# Patient Record
Sex: Female | Born: 1995 | Race: White | Hispanic: No | Marital: Single | Smoking: Heavy tobacco smoker
Health system: Southern US, Community
[De-identification: ages and names within clinical notes are randomized; demographics above are authoritative.]

## PROBLEM LIST (undated history)

## (undated) ENCOUNTER — Inpatient Hospital Stay (HOSPITAL_COMMUNITY): Payer: Self-pay

## (undated) DIAGNOSIS — Z6841 Body Mass Index (BMI) 40.0 and over, adult: Secondary | ICD-10-CM

## (undated) DIAGNOSIS — D219 Benign neoplasm of connective and other soft tissue, unspecified: Secondary | ICD-10-CM

## (undated) DIAGNOSIS — E119 Type 2 diabetes mellitus without complications: Secondary | ICD-10-CM

## (undated) DIAGNOSIS — F329 Major depressive disorder, single episode, unspecified: Secondary | ICD-10-CM

## (undated) DIAGNOSIS — O24419 Gestational diabetes mellitus in pregnancy, unspecified control: Secondary | ICD-10-CM

## (undated) DIAGNOSIS — E282 Polycystic ovarian syndrome: Secondary | ICD-10-CM

## (undated) DIAGNOSIS — N809 Endometriosis, unspecified: Secondary | ICD-10-CM

## (undated) DIAGNOSIS — F419 Anxiety disorder, unspecified: Secondary | ICD-10-CM

## (undated) DIAGNOSIS — F32A Depression, unspecified: Secondary | ICD-10-CM

## (undated) DIAGNOSIS — R079 Chest pain, unspecified: Secondary | ICD-10-CM

## (undated) DIAGNOSIS — K219 Gastro-esophageal reflux disease without esophagitis: Secondary | ICD-10-CM

## (undated) DIAGNOSIS — G8929 Other chronic pain: Secondary | ICD-10-CM

## (undated) DIAGNOSIS — R87629 Unspecified abnormal cytological findings in specimens from vagina: Secondary | ICD-10-CM

## (undated) HISTORY — DX: Benign neoplasm of connective and other soft tissue, unspecified: D21.9

## (undated) HISTORY — PX: WISDOM TOOTH EXTRACTION: SHX21

## (undated) HISTORY — DX: Polycystic ovarian syndrome: E28.2

## (undated) HISTORY — PX: ADENOIDECTOMY: SUR15

## (undated) HISTORY — DX: Unspecified abnormal cytological findings in specimens from vagina: R87.629

## (undated) HISTORY — PX: TONSILLECTOMY: SUR1361

## (undated) HISTORY — DX: Endometriosis, unspecified: N80.9

---
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
## 2015-01-08 ENCOUNTER — Encounter (HOSPITAL_COMMUNITY): Payer: Self-pay

## 2015-01-08 ENCOUNTER — Emergency Department (HOSPITAL_COMMUNITY)
Admission: EM | Admit: 2015-01-08 | Discharge: 2015-01-08 | Disposition: A | Discharge status: Home / Self Care | Payer: Medicaid Other | Attending: Emergency Medicine | Admitting: Emergency Medicine

## 2015-01-08 DIAGNOSIS — J019 Acute sinusitis, unspecified: Secondary | ICD-10-CM

## 2015-01-08 DIAGNOSIS — R0981 Nasal congestion: Secondary | ICD-10-CM | POA: Diagnosis present

## 2015-01-08 DIAGNOSIS — F909 Attention-deficit hyperactivity disorder, unspecified type: Secondary | ICD-10-CM | POA: Insufficient documentation

## 2015-01-08 DIAGNOSIS — F419 Anxiety disorder, unspecified: Secondary | ICD-10-CM | POA: Insufficient documentation

## 2015-01-08 DIAGNOSIS — J069 Acute upper respiratory infection, unspecified: Secondary | ICD-10-CM | POA: Insufficient documentation

## 2015-01-08 DIAGNOSIS — Z79899 Other long term (current) drug therapy: Secondary | ICD-10-CM | POA: Diagnosis not present

## 2015-01-08 DIAGNOSIS — F17210 Nicotine dependence, cigarettes, uncomplicated: Secondary | ICD-10-CM | POA: Diagnosis not present

## 2015-01-08 DIAGNOSIS — K219 Gastro-esophageal reflux disease without esophagitis: Secondary | ICD-10-CM | POA: Diagnosis not present

## 2015-01-08 MED ORDER — PSEUDOEPHEDRINE HCL 60 MG PO TABS: 60.0000 mg | ORAL_TABLET | Freq: Four times a day (QID) | ORAL | Status: DC | PRN

## 2015-01-08 MED ORDER — FLUTICASONE PROPIONATE 50 MCG/ACT NA SUSP: 2.0000 | Freq: Every day | NASAL | Status: DC

## 2015-01-08 NOTE — ED Provider Notes (Signed)
CSN: [id number]     Arrival date & time 01/08/15  1732 History   By signing my name below, I, Randa Evens, attest that this documentation has been prepared under the direction and in the presence of Delsa Grana, PA-C. Electronically Signed: Randa Evens, ED Scribe. 01/08/2015. 5:53 PM.    Chief Complaint  Patient presents with  . Nasal Congestion    The history is provided by the patient. No language interpreter was used.   HPI Comments: Beverly Torres is a 19 y.o. female who presents to the Emergency Department complaining of new congestion onset 1 day. Pt states that she has associated slight sore throat, ear pain and sinus pressure. She reports nausea this morning which she attributes to not taking her acid reflux medication. She states that leaning her head forward makes the congestion worse. Pt states she has tried Robitussin with temporary relief. Denies fever, drainage, post nasal drip, cough, chest tightness, SOB and HA.  Does report recent sick contact with a friend who was diagnosed with URI.   Past Medical History  Diagnosis Date  . Attention deficit disorder   . Anxiety   . Acid reflux   . Anxiety    Past Surgical History  Procedure Laterality Date  . Adenoidectomy    . Tonsillectomy     No family history on file. Social History  Substance Use Topics  . Smoking status: Current Every Day Smoker    Types: Cigarettes  . Smokeless tobacco: Never Used  . Alcohol Use: No   OB History    No data available      Review of Systems  Constitutional: Negative.  Negative for fever and chills.  HENT: Positive for congestion, ear pain, sinus pressure and sore throat. Negative for ear discharge, postnasal drip and rhinorrhea.   Respiratory: Negative for cough, choking, chest tightness, shortness of breath, wheezing and stridor.   Cardiovascular: Negative.   Gastrointestinal: Negative for nausea and vomiting.  Skin: Negative.  Negative for rash.  Neurological: Negative.   Negative for headaches.     Allergies  Review of patient's allergies indicates no known allergies.  Home Medications   Prior to Admission medications   Medication Sig Start Date End Date Taking? Authorizing Provider  busPIRone (BUSPAR) 15 MG tablet Take 30 mg by mouth 2 (two) times daily.    Historical Provider, MD  cetirizine (ZYRTEC) 10 MG tablet Take 10 mg by mouth daily.    Historical Provider, MD  cloNIDine (CATAPRES) 0.3 MG tablet Take 0.3 mg by mouth at bedtime.     Historical Provider, MD  diphenhydrAMINE (BENADRYL) 25 MG tablet Take 25 mg by mouth every 6 (six) hours as needed for itching.    Historical Provider, MD  HYDROcodone-acetaminophen (NORCO/VICODIN) 5-325 MG per tablet Take 1-2 tablets by mouth every 6 (six) hours as needed. 04/10/14   Noland Fordyce, PA-C  lisdexamfetamine (VYVANSE) 50 MG capsule Take 50 mg by mouth daily.     Historical Provider, MD  naproxen (NAPROSYN) 500 MG tablet Take 1 tablet (500 mg total) by mouth 2 (two) times daily with a meal. 09/28/14   Linton Flemings, MD  oxymetazoline (AFRIN) 0.05 % nasal spray Place 1 spray into both nostrils once. No more than 3 days in a row 09/28/14   Linton Flemings, MD  polyethylene glycol [facility] / GLYCOLAX) packet Take 17 g by mouth daily. 10/28/12   Marissa Sciacca, PA-C  pseudoephedrine (SUDAFED) 120 MG 12 hr tablet Take 1 tablet (120 mg total)  by mouth 2 (two) times daily. 09/28/14   Linton Flemings, MD  ranitidine (ZANTAC) 150 MG tablet Take 150 mg by mouth 2 (two) times daily.    Historical Provider, MD   BP 123/64 mmHg  Temp(Src) 98.2 F (36.8 C) (Oral)  Resp 19  SpO2 100%   Physical Exam  Constitutional: She is oriented to person, place, and time. She appears well-developed and well-nourished. No distress.  HENT:  Head: Normocephalic and atraumatic.  Right Ear: Hearing, tympanic membrane, external ear and ear canal normal.  Left Ear: Hearing, tympanic membrane, external ear and ear canal normal.  Nose: Mucosal edema and  sinus tenderness present. Right sinus exhibits maxillary sinus tenderness and frontal sinus tenderness. Left sinus exhibits maxillary sinus tenderness and frontal sinus tenderness.  Mouth/Throat: Uvula is midline, oropharynx is clear and moist and mucous membranes are normal. Mucous membranes are not pale, not dry and not cyanotic. No oropharyngeal exudate, posterior oropharyngeal edema or posterior oropharyngeal erythema.  Nasal mucosal erythematous with clear discharge.  Eyes: Conjunctivae and EOM are normal.  Neck: Neck supple. No tracheal deviation present.  Cardiovascular: Normal rate, regular rhythm and normal heart sounds.   Pulmonary/Chest: Effort normal and breath sounds normal. No respiratory distress. She has no wheezes. She has no rales.  Musculoskeletal: Normal range of motion.  Lymphadenopathy:    She has no cervical adenopathy.  Neurological: She is alert and oriented to person, place, and time.  Skin: Skin is warm and dry.  Psychiatric: She has a normal mood and affect. Her behavior is normal.  Nursing note and vitals reviewed.   ED Course  Procedures (including critical care time) DIAGNOSTIC STUDIES: Oxygen Saturation is 100% on RA, normal by my interpretation.    COORDINATION OF CARE: 6:14 PM-Discussed treatment plan with pt at bedside and pt agreed to plan.     Labs Review Labs Reviewed - No data to display  Imaging Review No results found.    EKG Interpretation None      MDM   Final diagnoses:  None   Pt with nasal congestion and facial pain since last night, + sick contacts with URI Pt denies fever, cough, SOB, HA.  She has tried her anxiety medicine, vistaril and robitussin with temporary relief.  Exam is consistent with acute rhinosinusitis, likely viral.   Pt D/C'd with rx for flonase and sudafed.  Given return precautions.  Will f/up with PCP as needed.  Filed Vitals:   01/08/15 1747  BP: 123/64  Temp: 98.2 F (36.8 C)  TempSrc: Oral   Resp: 19  SpO2: 100%     I personally performed the services described in this documentation, which was scribed in my presence. The recorded information has been reviewed and is accurate.       Delsa Grana, PA-C 01/08/15 1841  Lajean Saver, MD 01/08/15 2011

## 2015-01-08 NOTE — ED Notes (Signed)
Pt here for nasal congestion, headache, "it seems like seasonal stuff" . Denies cough or chest pain

## 2015-01-08 NOTE — Discharge Instructions (Signed)
Upper Respiratory Infection, Adult Most upper respiratory infections (URIs) are caused by a virus. A URI affects the nose, throat, and upper air passages. The most common type of URI is often called "the common cold." HOME CARE   Take medicines only as told by your doctor.  Gargle warm saltwater or take cough drops to comfort your throat as told by your doctor.  Use a warm mist humidifier or inhale steam from a shower to increase air moisture. This may make it easier to breathe.  Drink enough fluid to keep your pee (urine) clear or pale yellow.  Eat soups and other clear broths.  Have a healthy diet.  Rest as needed.  Go back to work when your fever is gone or your doctor says it is okay.  You may need to stay home longer to avoid giving your URI to others.  You can also wear a face mask and wash your hands often to prevent spread of the virus.  Use your inhaler more if you have asthma.  Do not use any tobacco products, including cigarettes, chewing tobacco, or electronic cigarettes. If you need help quitting, ask your doctor. GET HELP IF:  You are getting worse, not better.  Your symptoms are not helped by medicine.  You have chills.  You are getting more short of breath.  You have brown or red mucus.  You have yellow or brown discharge from your nose.  You have pain in your face, especially when you bend forward.  You have a fever.  You have puffy (swollen) neck glands.  You have pain while swallowing.  You have white areas in the back of your throat. GET HELP RIGHT AWAY IF:   You have very bad or constant:  Headache.  Ear pain.  Pain in your forehead, behind your eyes, and over your cheekbones (sinus pain).  Chest pain.  You have long-lasting (chronic) lung disease and any of the following:  Wheezing.  Long-lasting cough.  Coughing up blood.  A change in your usual mucus.  You have a stiff neck.  You have changes in  your:  Vision.  Hearing.  Thinking.  Mood. MAKE SURE YOU:   Understand these instructions.  Will watch your condition.  Will get help right away if you are not doing well or get worse.   This information is not intended to replace advice given to you by your health care provider. Make sure you discuss any questions you have with your health care provider.   Document Released: 07/14/2007 Document Revised: 06/11/2014 Document Reviewed: 05/02/2013 Elsevier Interactive Patient Education 2016 Else[employer]  Sinusitis, Adult Sinusitis is redness, soreness, and puffiness (inflammation) of the air pockets in the bones of your face (sinuses). The redness, soreness, and puffiness can cause air and mucus to get trapped in your sinuses. This can allow germs to grow and cause an infection.  HOME CARE   Drink enough fluids to keep your pee (urine) clear or pale yellow.  Use a humidifier in your home.  Run a hot shower to create steam in the bathroom. Sit in the bathroom with the door closed. Breathe in the steam 3-4 times a day.  Put a warm, moist washcloth on your face 3-4 times a day, or as told by your doctor.  Use salt water sprays (saline sprays) to wet the thick fluid in your nose. This can help the sinuses drain.  Only take medicine as told by your doctor. GET HELP RIGHT AWAY IF:  Your pain gets worse.  You have very bad headaches.  You are sick to your stomach (nauseous).  You throw up (vomit).  You are very sleepy (drowsy) all the time.  Your face is puffy (swollen).  Your vision changes.  You have a stiff neck.  You have trouble breathing. MAKE SURE YOU:   Understand these instructions.  Will watch your condition.  Will get help right away if you are not doing well or get worse.   This information is not intended to replace advice given to you by your health care provider. Make sure you discuss any questions you have with your health care provider.    Document Released: 07/14/2007 Document Revised: 02/15/2014 Document Reviewed: 08/31/2011 Elsevier Interactive Patient Education [employer].

## 2015-01-08 NOTE — ED Notes (Signed)
See PAs notes for secondary assessment.  

## 2015-01-12 ENCOUNTER — Emergency Department (HOSPITAL_COMMUNITY)
Admission: EM | Admit: 2015-01-12 | Discharge: 2015-01-12 | Disposition: A | Discharge status: Home / Self Care | Payer: Medicaid Other | Attending: Emergency Medicine | Admitting: Emergency Medicine

## 2015-01-12 ENCOUNTER — Encounter (HOSPITAL_COMMUNITY): Payer: Self-pay | Admitting: *Deleted

## 2015-01-12 DIAGNOSIS — Z79899 Other long term (current) drug therapy: Secondary | ICD-10-CM | POA: Insufficient documentation

## 2015-01-12 DIAGNOSIS — F909 Attention-deficit hyperactivity disorder, unspecified type: Secondary | ICD-10-CM | POA: Insufficient documentation

## 2015-01-12 DIAGNOSIS — Z3202 Encounter for pregnancy test, result negative: Secondary | ICD-10-CM | POA: Insufficient documentation

## 2015-01-12 DIAGNOSIS — F17210 Nicotine dependence, cigarettes, uncomplicated: Secondary | ICD-10-CM | POA: Insufficient documentation

## 2015-01-12 DIAGNOSIS — Z7951 Long term (current) use of inhaled steroids: Secondary | ICD-10-CM | POA: Insufficient documentation

## 2015-01-12 DIAGNOSIS — F419 Anxiety disorder, unspecified: Secondary | ICD-10-CM | POA: Insufficient documentation

## 2015-01-12 DIAGNOSIS — K219 Gastro-esophageal reflux disease without esophagitis: Secondary | ICD-10-CM | POA: Insufficient documentation

## 2015-01-12 DIAGNOSIS — R1030 Lower abdominal pain, unspecified: Secondary | ICD-10-CM | POA: Insufficient documentation

## 2015-01-12 DIAGNOSIS — Z791 Long term (current) use of non-steroidal anti-inflammatories (NSAID): Secondary | ICD-10-CM | POA: Insufficient documentation

## 2015-01-12 DIAGNOSIS — E669 Obesity, unspecified: Secondary | ICD-10-CM | POA: Insufficient documentation

## 2015-01-12 LAB — URINALYSIS, ROUTINE W REFLEX MICROSCOPIC
BILIRUBIN URINE: NEGATIVE
Glucose, UA: NEGATIVE mg/dL
Hgb urine dipstick: NEGATIVE
Ketones, ur: NEGATIVE mg/dL
Leukocytes, UA: NEGATIVE
NITRITE: NEGATIVE
PH: 6.5 (ref 5.0–8.0)
Protein, ur: NEGATIVE mg/dL
SPECIFIC GRAVITY, URINE: 1.028 (ref 1.005–1.030)

## 2015-01-12 LAB — PREGNANCY, URINE: Preg Test, Ur: NEGATIVE
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
## 2015-01-12 NOTE — Discharge Instructions (Signed)

## 2015-01-12 NOTE — ED Notes (Signed)
Pt reports lower abd pain since yesterday. Denies vaginal or urinary symptoms.

## 2015-01-23 ENCOUNTER — Institutional Professional Consult (permissible substitution): Payer: Medicaid Other | Admitting: Family

## 2015-01-23 DIAGNOSIS — F411 Generalized anxiety disorder: Secondary | ICD-10-CM | POA: Diagnosis not present

## 2015-01-23 DIAGNOSIS — F902 Attention-deficit hyperactivity disorder, combined type: Secondary | ICD-10-CM | POA: Diagnosis not present

## 2015-02-04 ENCOUNTER — Emergency Department (HOSPITAL_COMMUNITY)
Admission: EM | Admit: 2015-02-04 | Discharge: 2015-02-04 | Disposition: A | Discharge status: Home / Self Care | Payer: Medicaid Other | Attending: Emergency Medicine | Admitting: Emergency Medicine

## 2015-02-04 ENCOUNTER — Encounter (HOSPITAL_COMMUNITY): Payer: Self-pay | Admitting: *Deleted

## 2015-02-04 DIAGNOSIS — J069 Acute upper respiratory infection, unspecified: Secondary | ICD-10-CM

## 2015-02-04 DIAGNOSIS — K5900 Constipation, unspecified: Secondary | ICD-10-CM | POA: Insufficient documentation

## 2015-02-04 DIAGNOSIS — K921 Melena: Secondary | ICD-10-CM | POA: Insufficient documentation

## 2015-02-04 DIAGNOSIS — F419 Anxiety disorder, unspecified: Secondary | ICD-10-CM | POA: Insufficient documentation

## 2015-02-04 DIAGNOSIS — Z79899 Other long term (current) drug therapy: Secondary | ICD-10-CM | POA: Insufficient documentation

## 2015-02-04 DIAGNOSIS — K5909 Other constipation: Secondary | ICD-10-CM

## 2015-02-04 DIAGNOSIS — K219 Gastro-esophageal reflux disease without esophagitis: Secondary | ICD-10-CM | POA: Insufficient documentation

## 2015-02-04 DIAGNOSIS — Z3202 Encounter for pregnancy test, result negative: Secondary | ICD-10-CM | POA: Insufficient documentation

## 2015-02-04 DIAGNOSIS — F17210 Nicotine dependence, cigarettes, uncomplicated: Secondary | ICD-10-CM | POA: Insufficient documentation

## 2015-02-04 LAB — POC OCCULT BLOOD, ED: Fecal Occult Bld: POSITIVE — AB

## 2015-02-04 LAB — URINALYSIS, ROUTINE W REFLEX MICROSCOPIC
Bilirubin Urine: NEGATIVE
Glucose, UA: NEGATIVE mg/dL
HGB URINE DIPSTICK: NEGATIVE
Ketones, ur: NEGATIVE mg/dL
LEUKOCYTES UA: NEGATIVE
NITRITE: NEGATIVE
PH: 5.5 (ref 5.0–8.0)
Protein, ur: NEGATIVE mg/dL
SPECIFIC GRAVITY, URINE: 1.021 (ref 1.005–1.030)

## 2015-02-04 LAB — COMPREHENSIVE METABOLIC PANEL
ALBUMIN: 4 g/dL (ref 3.5–5.0)
ALT: 35 U/L (ref 14–54)
ANION GAP: 9 (ref 5–15)
AST: 26 U/L (ref 15–41)
Alkaline Phosphatase: 63 U/L (ref 38–126)
BILIRUBIN TOTAL: 0.4 mg/dL (ref 0.3–1.2)
BUN: 9 mg/dL (ref 6–20)
CO2: 23 mmol/L (ref 22–32)
Calcium: 9.9 mg/dL (ref 8.9–10.3)
Chloride: 109 mmol/L (ref 101–111)
Creatinine, Ser: 0.85 mg/dL (ref 0.44–1.00)
GFR calc Af Amer: >60 mL/min (ref >60)
GLUCOSE: 113 mg/dL — AB (ref 65–99)
POTASSIUM: 3.9 mmol/L (ref 3.5–5.1)
Sodium: 141 mmol/L (ref 135–145)
TOTAL PROTEIN: 6.5 g/dL (ref 6.5–8.1)

## 2015-02-04 LAB — CBC
HEMATOCRIT: 40.9 % (ref 36.0–46.0)
HEMOGLOBIN: 13.7 g/dL (ref 12.0–15.0)
MCH: 30 pg (ref 26.0–34.0)
MCHC: 33.5 g/dL (ref 30.0–36.0)
MCV: 89.5 fL (ref 78.0–100.0)
Platelets: 307 10*3/uL (ref 150–400)
RBC: 4.57 MIL/uL (ref 3.87–5.11)
RDW: 12.2 % (ref 11.5–15.5)
WBC: 9.2 10*3/uL (ref 4.0–10.5)

## 2015-02-04 LAB — POC URINE PREG, ED: PREG TEST UR: NEGATIVE

## 2015-02-04 MED ORDER — DOCUSATE SODIUM 100 MG PO CAPS: 100.0000 mg | ORAL_CAPSULE | Freq: Two times a day (BID) | ORAL | Status: DC

## 2015-02-04 MED ORDER — DEXTROMETHORPHAN-GUAIFENESIN 20-200 MG/10ML PO SOLN: 10.0000 mL | Freq: Four times a day (QID) | ORAL | Status: DC | PRN

## 2015-02-04 NOTE — ED Notes (Signed)
The pt is c/o abd pain for 3 days and today she saw some blood in her stools she has chronic constipation  lmp dec 18th

## 2015-02-04 NOTE — Discharge Instructions (Signed)
Constipation, Adult Constipation is when a person:  Poops (has a bowel movement) less than 3 times a week.  Has a hard time pooping.  Has poop that is dry, hard, or bigger than normal. HOME CARE   Eat foods with a lot of fiber in them. This includes fruits, vegetables, beans, and whole grains such as brown rice.  Avoid fatty foods and foods with a lot of sugar. This includes french fries, hamburgers, cookies, candy, and soda.  If you are not getting enough fiber from food, take products with added fiber in them (supplements).  Drink enough fluid to keep your pee (urine) clear or pale yellow.  Exercise on a regular basis, or as told by your doctor.  Go to the restroom when you feel like you need to poop. Do not hold it.  Only take medicine as told by your doctor. Do not take medicines that help you poop (laxatives) without talking to your doctor first. GET HELP RIGHT AWAY IF:   You have bright red blood in your poop (stool).  Your constipation lasts more than 4 days or gets worse.  You have belly (abdominal) or butt (rectal) pain.  You have thin poop (as thin as a pencil).  You lose weight, and it cannot be explained. MAKE SURE YOU:   Understand these instructions.  Will watch your condition.  Will get help right away if you are not doing well or get worse.   This information is not intended to replace advice given to you by your health care provider. Make sure you discuss any questions you have with your health care provider.   Document Released: 07/14/2007 Document Revised: 02/15/2014 Document Reviewed: 11/06/2012 Elsevier Interactive Patient Education 2016 Elsevier [employer] Upper Respiratory Infection, Adult Most upper respiratory infections (URIs) are a viral infection of the air passages leading to the lungs. A URI affects the nose, throat, and upper air passages. The most common type of URI is nasopharyngitis and is typically referred to as "the common cold." URIs  run their course and usually go away on their own. Most of the time, a URI does not require medical attention, but sometimes a bacterial infection in the upper airways can follow a viral infection. This is called a secondary infection. Sinus and middle ear infections are common types of secondary upper respiratory infections. Bacterial pneumonia can also complicate a URI. A URI can worsen asthma and chronic obstructive pulmonary disease (COPD). Sometimes, these complications can require emergency medical care and may be life threatening.  CAUSES Almost all URIs are caused by viruses. A virus is a type of germ and can spread from one person to another.  RISKS FACTORS You may be at risk for a URI if:   You smoke.   You have chronic heart or lung disease.  You have a weakened defense (immune) system.   You are very young or very old.   You have nasal allergies or asthma.  You work in crowded or poorly ventilated areas.  You work in health care facilities or schools. SIGNS AND SYMPTOMS  Symptoms typically develop 2-3 days after you come in contact with a cold virus. Most viral URIs last 7-10 days. However, viral URIs from the influenza virus (flu virus) can last 14-18 days and are typically more severe. Symptoms may include:   Runny or stuffy (congested) nose.   Sneezing.   Cough.   Sore throat.   Headache.   Fatigue.   Fever.   Loss of appetite.  Pain in your forehead, behind your eyes, and over your cheekbones (sinus pain).  Muscle aches.  DIAGNOSIS  Your health care provider may diagnose a URI by:  Physical exam.  Tests to check that your symptoms are not due to another condition such as:  Strep throat.  Sinusitis.  Pneumonia.  Asthma. TREATMENT  A URI goes away on its own with time. It cannot be cured with medicines, but medicines may be prescribed or recommended to relieve symptoms. Medicines may help:  Reduce your fever.  Reduce your  cough.  Relieve nasal congestion. HOME CARE INSTRUCTIONS   Take medicines only as directed by your health care provider.   Gargle warm saltwater or take cough drops to comfort your throat as directed by your health care provider.  Use a warm mist humidifier or inhale steam from a shower to increase air moisture. This may make it easier to breathe.  Drink enough fluid to keep your urine clear or pale yellow.   Eat soups and other clear broths and maintain good nutrition.   Rest as needed.   Return to work when your temperature has returned to normal or as your health care provider advises. You may need to stay home longer to avoid infecting others. You can also use a face mask and careful hand washing to prevent spread of the virus.  Increase the usage of your inhaler if you have asthma.   Do not use any tobacco products, including cigarettes, chewing tobacco, or electronic cigarettes. If you need help quitting, ask your health care provider. PREVENTION  The best way to protect yourself from getting a cold is to practice good hygiene.   Avoid oral or hand contact with people with cold symptoms.   Wash your hands often if contact occurs.  There is no clear evidence that vitamin C, vitamin E, echinacea, or exercise reduces the chance of developing a cold. However, it is always recommended to get plenty of rest, exercise, and practice good nutrition.  SEEK MEDICAL CARE IF:   You are getting worse rather than better.   Your symptoms are not controlled by medicine.   You have chills.  You have worsening shortness of breath.  You have brown or red mucus.  You have yellow or brown nasal discharge.  You have pain in your face, especially when you bend forward.  You have a fever.  You have swollen neck glands.  You have pain while swallowing.  You have white areas in the back of your throat. SEEK IMMEDIATE MEDICAL CARE IF:   You have severe or  persistent:  Headache.  Ear pain.  Sinus pain.  Chest pain.  You have chronic lung disease and any of the following:  Wheezing.  Prolonged cough.  Coughing up blood.  A change in your usual mucus.  You have a stiff neck.  You have changes in your:  Vision.  Hearing.  Thinking.  Mood. MAKE SURE YOU:   Understand these instructions.  Will watch your condition.  Will get help right away if you are not doing well or get worse.   This information is not intended to replace advice given to you by your health care provider. Make sure you discuss any questions you have with your health care provider.   Document Released: 07/21/2000 Document Revised: 06/11/2014 Document Reviewed: 05/02/2013 Elsevier Interactive Patient Education [employer].

## 2015-02-04 NOTE — ED Provider Notes (Signed)
CSN: [id number]     Arrival date & time 02/04/15  1727 History   First MD Initiated Contact with Patient 02/04/15 2112     Chief Complaint  Patient presents with  . Abdominal Pain     (Consider location/radiation/quality/duration/timing/severity/associated sxs/prior Treatment) HPI Comments: Patient with history of constipation dominant irritable bowel reports noticing small amount of blood when she wiped after attempting defecation this afternoon. She endorses frequent constipation with hard stools. Last normal BM about three days ago.  Patient is a 19 y.o. female presenting with hematochezia and URI.  Rectal Bleeding Quality:  Bright red Amount:  Scant Duration:  1 day Timing:  Sporadic Progression:  Unable to specify Chronicity:  Recurrent Context: constipation   Similar prior episodes: no   Ineffective treatments:  None tried Associated symptoms: abdominal pain   Associated symptoms: no dizziness, no fever, no hematemesis and no vomiting   URI Presenting symptoms: congestion, cough and rhinorrhea   Presenting symptoms: no fever   Severity:  Mild Onset quality:  Gradual Duration:  3 days Timing:  Intermittent Progression:  Waxing and waning Chronicity:  New Associated symptoms: no myalgias, no swollen glands and no wheezing     Past Medical History  Diagnosis Date  . Attention deficit disorder   . Anxiety   . Acid reflux   . Anxiety    Past Surgical History  Procedure Laterality Date  . Adenoidectomy    . Tonsillectomy    . Wisdom tooth extraction     No family history on file. Social History  Substance Use Topics  . Smoking status: Current Every Day Smoker    Types: Cigarettes  . Smokeless tobacco: Never Used  . Alcohol Use: No   OB History    No data available     Review of Systems  Constitutional: Negative for fever.  HENT: Positive for congestion and rhinorrhea.   Respiratory: Positive for cough. Negative for wheezing.   Gastrointestinal:  Positive for abdominal pain and hematochezia. Negative for vomiting and hematemesis.  Musculoskeletal: Negative for myalgias.  Neurological: Negative for dizziness.  All other systems reviewed and are negative.     Allergies  Review of patient's allergies indicates no known allergies.  Home Medications   Prior to Admission medications   Medication Sig Start Date End Date Taking? Authorizing Provider  HYDROcodone-acetaminophen (NORCO/VICODIN) 5-325 MG per tablet Take 1-2 tablets by mouth every 6 (six) hours as needed. Patient not taking: Reported on 01/12/2015 04/10/14   Noland Fordyce, PA-C  hydrOXYzine (ATARAX/VISTARIL) 50 MG tablet Take 50 mg by mouth 3 (three) times daily as needed for anxiety.    Historical Provider, MD  naproxen (NAPROSYN) 500 MG tablet Take 1 tablet (500 mg total) by mouth 2 (two) times daily with a meal. Patient not taking: Reported on 01/12/2015 09/28/14   Linton Flemings, MD  oxymetazoline (AFRIN) 0.05 % nasal spray Place 1 spray into both nostrils once. No more than 3 days in a row Patient not taking: Reported on 01/12/2015 09/28/14   Linton Flemings, MD  polyethylene glycol [facility] Floria [facility]aveling) packet Take 17 g by mouth daily. Patient not taking: Reported on 01/12/2015 10/28/12   Marissa Sciacca, PA-C  pseudoephedrine (SUDAFED) 60 MG tablet Take 1 tablet (60 mg total) by mouth every 6 (six) hours as needed for congestion. Patient not taking: Reported on 01/12/2015 01/08/15   Delsa Grana, PA-C  ranitidine (ZANTAC) 150 MG tablet Take 150 mg by mouth 2 (two) times daily.    Historical Provider,  MD   BP 116/75 mmHg  Pulse 109  Temp(Src) 98.6 F (37 C) (Oral)  Resp 18  Ht 5\' 6"  (1.676 m)  Wt 89.359 kg  BMI 31.81 kg/m2  SpO2 100%  LMP 01/26/2015 Physical Exam  Constitutional: She is oriented to person, place, and time. She appears well-developed and well-nourished.  HENT:  Head: Normocephalic and atraumatic.  Eyes: Conjunctivae are normal. Pupils are equal, round, and  reactive to light.  Neck: Neck supple.  Cardiovascular: Normal rate and regular rhythm.   Pulmonary/Chest: Effort normal and breath sounds normal.  Abdominal: Soft. Bowel sounds are normal. She exhibits no distension. There is no tenderness.  Genitourinary: Rectum normal. Rectal exam shows no external hemorrhoid, no internal hemorrhoid and no mass.  Musculoskeletal: She exhibits no edema or tenderness.  Lymphadenopathy:    She has no cervical adenopathy.  Neurological: She is alert and oriented to person, place, and time.  Skin: Skin is warm and dry.  Psychiatric: She has a normal mood and affect.  Nursing note and vitals reviewed.   ED Course  Procedures (including critical care time) Labs Review Labs Reviewed  COMPREHENSIVE METABOLIC PANEL - Abnormal; Notable for the following:    Glucose, Bld 113 (*)    All other components within normal limits  POC OCCULT BLOOD, ED - Abnormal; Notable for the following:    Fecal Occult Bld POSITIVE (*)    All other components within normal limits  CBC  URINALYSIS, ROUTINE W REFLEX MICROSCOPIC (NOT AT [facility])  POC URINE PREG, ED    Imaging Review No results found. I have personally reviewed and evaluated these images and lab results as part of my medical decision-making.   EKG Interpretation None     Lab results reviewed and shared with patient. Patient is not anemic. No gross blood or hemorrhoids on rectal exam. Benign abdominal exam. No hemodynamic instability MDM   Final diagnoses:  None    URI. Rectal bleeding. Care instructions provided. Return precautions discussed. Follow-up with PCP/Gastroenterology.    Etta Quill, NP 02/05/15 0045  Noemi Chapel, MD 02/08/15 [phone]

## 2015-02-13 ENCOUNTER — Institutional Professional Consult (permissible substitution): Payer: Self-pay | Admitting: Family

## 2015-05-14 ENCOUNTER — Telehealth: Payer: Self-pay | Admitting: Family

## 2015-05-14 NOTE — Telephone Encounter (Signed)
Received fax requesting refill for Clonidine HCL 0.3 mg.  Patient last seen 02/13/15.  Left message for patient to call and schedule a return appointment. Left second message 05/14/15 2:20pm.

## 2015-05-14 NOTE — Telephone Encounter (Signed)
Patient is no longer taking Clonidine. [facility] informed by fax.

## 2015-06-11 ENCOUNTER — Telehealth: Payer: Self-pay | Admitting: Family

## 2015-06-11 NOTE — Telephone Encounter (Signed)
Patient called for refill for Hydroxyzine.  Patient last seen 01/23/15.  Left message for patient to call and schedule follow-up as soon as possible.

## 2015-06-12 ENCOUNTER — Ambulatory Visit (INDEPENDENT_AMBULATORY_CARE_PROVIDER_SITE_OTHER): Payer: Medicaid Other | Admitting: Family

## 2015-06-12 ENCOUNTER — Encounter: Payer: Self-pay | Admitting: Family

## 2015-06-12 VITALS — BP 112/64 | HR 84 | Resp 16 | Ht 63.25 in | Wt 203.5 lb

## 2015-06-12 DIAGNOSIS — F902 Attention-deficit hyperactivity disorder, combined type: Secondary | ICD-10-CM

## 2015-06-12 DIAGNOSIS — G479 Sleep disorder, unspecified: Secondary | ICD-10-CM

## 2015-06-12 DIAGNOSIS — F411 Generalized anxiety disorder: Secondary | ICD-10-CM | POA: Diagnosis not present

## 2015-06-12 MED ORDER — CETIRIZINE HCL 10 MG PO TABS: 10.0000 mg | ORAL_TABLET | Freq: Every day | ORAL | Status: DC

## 2015-06-12 MED ORDER — HYDROXYZINE HCL 50 MG PO TABS: 50.0000 mg | ORAL_TABLET | Freq: Three times a day (TID) | ORAL | Status: DC | PRN

## 2015-06-12 NOTE — Progress Notes (Signed)
[facility] [place] [place] [address] Dept: [phone] Dept Fax: [phone] Loc: [phone] Loc Fax: [phone]  Medical Follow-up  Patient ID: Beverly Torres, female  DOB: October 16, 1995, 20 y.o.  MRN: [id number]  Date of Evaluation: 06/12/15   PCP: Chesley Noon, MD  Accompanied by: Self Patient Lives with: mother and siblings  HISTORY/CURRENT STATUS:  HPI  Patient here for routine follow up related to Anxiety along with ADHD and medication management. Patient has continued to take Hydroxyzine 2-4 times daily as needed related to increased anxiety. Patient polite and cooperative at the follow up visit with decreased anxiety in the office today. Patient living at home with mother and siblings along with boyfriend.   EDUCATION: Seeking work at this time.  Activities/Exercise: rarely  Current Exercise Habits: The patient does not participate in regular exercise at present Exercise limited by: None identified   MEDICAL HISTORY: Appetite: Good MVI/Other: none Fruits/Vegs:some Calcium: some Iron:some  Sleep: Bedtime: 2-4:00 am Awakens: 9-11:00 am Sleep Concerns: Initiation/Maintenance/Other: Initiation related to increased anxiety symptoms.  Individual Medical History/Review of System Changes? No  Allergies: Review of patient's allergies indicates no known allergies.  Current Medications:  Current outpatient prescriptions:  .  hydrOXYzine (ATARAX/VISTARIL) 50 MG tablet, Take 50 mg by mouth 3 (three) times daily as needed for anxiety., Disp: , Rfl:  .  ranitidine (ZANTAC) 150 MG tablet, Take 150 mg by mouth 2 (two) times daily., Disp: , Rfl:  Medication Side Effects: None  Family Medical/Social History Changes?: No  MENTAL HEALTH: Mental Health Issues: Anxiety-better with current medication.  PHYSICAL EXAM: Vitals:  There were no vitals filed for this visit., No unique date with height and weight on file.  General Exam: Physical Exam  Constitutional: She is oriented to person, place, and time. She appears well-developed and well-nourished.  HENT:  Head: Normocephalic and atraumatic.  Right Ear: External ear normal.  Left Ear: External ear normal.  Nose: Nose normal.  Mouth/Throat: Oropharynx is clear and moist.  Non productive cough, post nasal drip, and increased congestion.  Eyes: Conjunctivae and EOM are normal. Pupils are equal, round, and reactive to light.  Neck: Normal range of motion. Neck supple.  Cardiovascular: Normal rate, regular rhythm, normal heart sounds and intact distal pulses.   Pulmonary/Chest: Effort normal and breath sounds normal.  Abdominal: Soft.  Musculoskeletal: Normal range of motion.  Neurological: She is alert and oriented to person, place, and time. She has normal reflexes.  Skin: Skin is warm and dry.  Psychiatric: She has a normal mood and affect. Her behavior is normal. Judgment and thought content normal.  Vitals reviewed.   Neurological: oriented to time, place, and person Cranial Nerves: normal  Neuromuscular:  Motor Mass: Normal Tone: Normal Strength: Normal DTRs: 2+ and symmetric Overflow: None Reflexes: no tremors noted Sensory Exam: Vibratory: Intact  Fine Touch: Intact  Testing/Developmental Screens:  ASRS-13/14    DIAGNOSES:    ICD-9-CM ICD-10-CM   1. ADHD (attention deficit hyperactivity disorder), combined type 314.01 F90.2   2. Generalized anxiety disorder 300.02 F41.1   3. Sleep disorder 780.50 G47.9     RECOMMENDATIONS: 3 month follow up visit for routine care and medication management. Continuation of current medication regimen-Hydroxyzine 50 mg 1 tablet 3 times daily as needed [facility]. Discussed need for counseling services to assist with anxiety-information provided to patient.  Increased seasonal  allergy  symptoms and escribed to [facility] [place] for Zyrtec 10 mg 1 tablet daily as needed for symptoms.  Encouraged to lose weight with suggestions of exercise and diet to assist.  NEXT APPOINTMENT: No Follow-up on file.  More than 50% of the appointment was spent counseling and discussing diagnosis and management of symptoms with the patient and family.  Carolann Littler, NP Counseling Time: 40 mins Total Contact Time: 40 mins

## 2015-09-11 ENCOUNTER — Institutional Professional Consult (permissible substitution): Payer: Medicaid Other | Admitting: Family

## 2015-09-11 ENCOUNTER — Telehealth: Payer: Self-pay | Admitting: Family

## 2015-09-11 NOTE — Telephone Encounter (Signed)
Called patient left a message to call the office about appointment today 2pm .

## 2015-10-18 DIAGNOSIS — F909 Attention-deficit hyperactivity disorder, unspecified type: Secondary | ICD-10-CM | POA: Insufficient documentation

## 2015-10-18 DIAGNOSIS — F17210 Nicotine dependence, cigarettes, uncomplicated: Secondary | ICD-10-CM | POA: Insufficient documentation

## 2015-10-18 DIAGNOSIS — R1084 Generalized abdominal pain: Secondary | ICD-10-CM | POA: Insufficient documentation

## 2015-10-19 ENCOUNTER — Encounter (HOSPITAL_COMMUNITY): Payer: Self-pay | Admitting: *Deleted

## 2015-10-19 ENCOUNTER — Emergency Department (HOSPITAL_COMMUNITY)
Admission: EM | Admit: 2015-10-19 | Discharge: 2015-10-19 | Disposition: A | Discharge status: Home / Self Care | Payer: Medicaid Other | Attending: Emergency Medicine | Admitting: Emergency Medicine

## 2015-10-19 DIAGNOSIS — R1084 Generalized abdominal pain: Secondary | ICD-10-CM

## 2015-10-19 LAB — URINALYSIS, ROUTINE W REFLEX MICROSCOPIC
Bilirubin Urine: NEGATIVE
GLUCOSE, UA: NEGATIVE mg/dL
HGB URINE DIPSTICK: NEGATIVE
KETONES UR: NEGATIVE mg/dL
Nitrite: NEGATIVE
PROTEIN: NEGATIVE mg/dL
Specific Gravity, Urine: 1.01 (ref 1.005–1.030)
pH: 6 (ref 5.0–8.0)

## 2015-10-19 LAB — URINE MICROSCOPIC-ADD ON

## 2015-10-19 LAB — POC URINE PREG, ED: PREG TEST UR: NEGATIVE

## 2015-10-19 NOTE — Discharge Instructions (Signed)
Try miralax one scoop in 8oz of water every day until you have significant bowel movements

## 2015-10-19 NOTE — ED Triage Notes (Signed)
Pt informed nurse first she was going outside to smoke

## 2015-10-19 NOTE — ED Provider Notes (Signed)
[facility] DEPT Provider Note   CSN: [id number] Arrival date & time: 10/18/15  2336  By signing my name below, I, Irene Pap, attest that this documentation has been prepared under the direction and in the presence of Deno Etienne, DO. Electronically Signed: Irene Pap, ED Scribe. 10/19/15. 2:14 AM.  History   Chief Complaint Chief Complaint  Patient presents with  . Abdominal Pain   The history is provided by the patient. No language interpreter was used.   HPI Comments: Beverly Torres is a 20 y.o. female who presents to the Emergency Department complaining of intermittent  RLQ abdominal pressure onset one day ago. She reports worsening pain with palpation and states that it lasted about 15 minutes. She is currently denying pain. She believes it might be constipation, but is unsure. Pt had a BM 3 hours ago, but it was less than normal. She has not taken anything for her symptoms. She denies fever, chills, nausea, vomiting, dysuria, hematuria, vaginal bleeding, or vaginal discharge.   Past Medical History:  Diagnosis Date  . Acid reflux   . Anxiety   . Anxiety   . Attention deficit disorder     Patient Active Problem List   Diagnosis Date Noted  . Anxiety 05/02/2012  . ADHD (attention deficit hyperactivity disorder), combined type 03/28/2012    Past Surgical History:  Procedure Laterality Date  . ADENOIDECTOMY    . TONSILLECTOMY    . WISDOM TOOTH EXTRACTION      OB History    No data available       Home Medications    Prior to Admission medications   Medication Sig Start Date End Date Taking? Authorizing Provider  cetirizine (ZYRTEC) 10 MG tablet Take 1 tablet (10 mg total) by mouth daily. 06/12/15   Carolann Littler, NP  hydrOXYzine (ATARAX/VISTARIL) 50 MG tablet Take 1 tablet (50 mg total) by mouth 3 (three) times daily as needed for anxiety. 06/12/15   Carolann Littler, NP  ranitidine (ZANTAC) 150 MG tablet Take 150 mg by mouth 2 (two) times daily.     Historical Provider, MD    Family History Family History  Problem Relation Age of Onset  . Anxiety disorder Mother   . Drug abuse Father   . ADD / ADHD Father   . ADD / ADHD Sister   . Anxiety disorder Sister   . ADD / ADHD Brother   . ODD Brother   . Rheum arthritis Maternal Grandmother   . Kidney failure Maternal Grandmother   . Hypertension Maternal Grandfather   . Diabetes Maternal Grandfather   . Arthritis Maternal Grandfather   . Cancer Maternal Grandfather     Melanoma  . Diabetes Paternal Grandfather     Social History Social History  Substance Use Topics  . Smoking status: Current Every Day Smoker    Types: Cigarettes  . Smokeless tobacco: Never Used  . Alcohol use No     Allergies   Review of patient's allergies indicates no known allergies.   Review of Systems Review of Systems  Constitutional: Negative for chills and fever.  HENT: Negative for congestion and rhinorrhea.   Eyes: Negative for redness and visual disturbance.  Respiratory: Negative for shortness of breath and wheezing.   Cardiovascular: Negative for chest pain and palpitations.  Gastrointestinal: Positive for abdominal pain. Negative for nausea and vomiting.  Genitourinary: Negative for dysuria, hematuria, urgency, vaginal bleeding and vaginal discharge.  Musculoskeletal: Negative for arthralgias and myalgias.  Skin: Negative for  pallor and wound.  Neurological: Negative for dizziness and headaches.     Physical Exam Updated Vital Signs BP 105/83 (BP Location: Left Arm)   Pulse 81   Temp 98.7 F (37.1 C) (Oral)   Resp 18   Ht 5\' 3"  (1.6 m)   Wt 211 lb 8 oz (95.9 kg)   SpO2 100%   BMI 37.47 kg/m   Physical Exam  Constitutional: She is oriented to person, place, and time. She appears well-developed and well-nourished. No distress.  HENT:  Head: Normocephalic and atraumatic.  Eyes: EOM are normal. Pupils are equal, round, and reactive to light.  Neck: Normal range of  motion. Neck supple.  Cardiovascular: Normal rate and regular rhythm.  Exam reveals no gallop and no friction rub.   No murmur heard. Pulmonary/Chest: Effort normal. She has no wheezes. She has no rales.  Abdominal: Soft. She exhibits no distension. There is no tenderness.  Musculoskeletal: She exhibits no edema or tenderness.  Neurological: She is alert and oriented to person, place, and time.  Skin: Skin is warm and dry. She is not diaphoretic.  Psychiatric: She has a normal mood and affect. Her behavior is normal.  Nursing note and vitals reviewed.    ED Treatments / Results  DIAGNOSTIC STUDIES: Oxygen Saturation is 100% on RA, normal by my interpretation.    COORDINATION OF CARE: 2:11 AM-Discussed treatment plan which includes labs with pt at bedside and pt agreed to plan.    Labs (all labs ordered are listed, but only abnormal results are displayed) Labs Reviewed  URINALYSIS, ROUTINE W REFLEX MICROSCOPIC (NOT AT [facility]) - Abnormal; Notable for the following:       Result Value   APPearance CLOUDY (*)    Leukocytes, UA TRACE (*)    All other components within normal limits  URINE MICROSCOPIC-ADD ON - Abnormal; Notable for the following:    Squamous Epithelial / LPF 6-30 (*)    Bacteria, UA FEW (*)    All other components within normal limits  POC URINE PREG, ED    EKG  EKG Interpretation None       Radiology No results found.  Procedures Procedures (including critical care time)  Medications Ordered in ED Medications - No data to display   Initial Impression / Assessment and Plan / ED Course  I have reviewed the triage vital signs and the nursing notes.  Pertinent labs & imaging results that were available during my care of the patient were reviewed by me and considered in my medical decision making (see chart for details).  Clinical Course    20 yo F with a cc of lower abdominal pain.  Going on for a matter of seconds this morning.  Now resolved.   Benign abdominal exam.  UA negative for UTI.  D/c home, trial miralax.   2:21 AM:  I have discussed the diagnosis/risks/treatment options with the patient and family and believe the pt to be eligible for discharge home to follow-up with PCP. We also discussed returning to the ED immediately if new or worsening sx occur. We discussed the sx which are most concerning (e.g., sudden worsening pain, fever, inability to tolerate by mouth) that necessitate immediate return. Medications administered to the patient during their visit and any new prescriptions provided to the patient are listed below.  Medications given during this visit Medications - No data to display   The patient appears reasonably screen and/or stabilized for discharge and I doubt any other medical condition  or other [facility] requiring further screening, evaluation, or treatment in the ED at this time prior to discharge.    Final Clinical Impressions(s) / ED Diagnoses   Final diagnoses:  Generalized abdominal pain   I personally performed the services described in this documentation, which was scribed in my presence. The recorded information has been reviewed and is accurate.    New Prescriptions New Prescriptions   No medications on file     Deno Etienne, DO 10/19/15 0221

## 2015-10-19 NOTE — ED Notes (Signed)
Pt understood dc material. NAD noted. 

## 2015-10-19 NOTE — ED Triage Notes (Signed)
The pt is c/o a few minutes of rt upper abd pain  Tonight  No n v  Last bm earlier hard.  lmp  Aug 27th  No distress

## 2015-11-13 ENCOUNTER — Encounter (HOSPITAL_COMMUNITY): Payer: Self-pay | Admitting: *Deleted

## 2015-11-13 ENCOUNTER — Emergency Department (HOSPITAL_COMMUNITY)
Admission: EM | Admit: 2015-11-13 | Discharge: 2015-11-13 | Disposition: A | Discharge status: Home / Self Care | Payer: Medicaid Other | Attending: Emergency Medicine | Admitting: Emergency Medicine

## 2015-11-13 DIAGNOSIS — N939 Abnormal uterine and vaginal bleeding, unspecified: Secondary | ICD-10-CM | POA: Insufficient documentation

## 2015-11-13 DIAGNOSIS — F909 Attention-deficit hyperactivity disorder, unspecified type: Secondary | ICD-10-CM | POA: Insufficient documentation

## 2015-11-13 DIAGNOSIS — F17210 Nicotine dependence, cigarettes, uncomplicated: Secondary | ICD-10-CM | POA: Insufficient documentation

## 2015-11-13 DIAGNOSIS — N923 Ovulation bleeding: Secondary | ICD-10-CM

## 2015-11-13 LAB — URINALYSIS, ROUTINE W REFLEX MICROSCOPIC
BILIRUBIN URINE: NEGATIVE
GLUCOSE, UA: NEGATIVE mg/dL
Ketones, ur: NEGATIVE mg/dL
Leukocytes, UA: NEGATIVE
Nitrite: NEGATIVE
PH: 6 (ref 5.0–8.0)
Protein, ur: NEGATIVE mg/dL
SPECIFIC GRAVITY, URINE: 1.037 — AB (ref 1.005–1.030)

## 2015-11-13 LAB — URINE MICROSCOPIC-ADD ON

## 2015-11-13 LAB — GC/CHLAMYDIA PROBE AMP (~~LOC~~) NOT AT ARMC
CHLAMYDIA, DNA PROBE: NEGATIVE
NEISSERIA GONORRHEA: NEGATIVE

## 2015-11-13 LAB — PREGNANCY, URINE: PREG TEST UR: NEGATIVE

## 2015-11-13 NOTE — ED Provider Notes (Signed)
[facility] DEPT Provider Note   CSN: [id number] Arrival date & time: 11/13/15  0144     History   Chief Complaint Chief Complaint  Patient presents with  . Pelvic Pain  . Vaginal Bleeding    HPI Beverly Torres is a 20 y.o. female.  The history is provided by the patient. No language interpreter was used.  Pelvic Pain   Vaginal Bleeding  Primary symptoms include pelvic pain, vaginal bleeding.    Beverly Torres is a 20 y.o. female who presents to the Emergency Department complaining of pelvic pain.  She presents for evaluation of pelvic pain and vaginal bleeding. Her last menstrual cycle finished 5 days ago and it was heavier than normal. Today she developed little twinges of right sided pelvic pain with brown vaginal bleeding. No fevers, nausea, vomiting, dysuria, vaginal discharge, diarrhea. She has a history of irregular cycles. She does not take any birth control. Symptoms are mild and intermittent.  Past Medical History:  Diagnosis Date  . Acid reflux   . Anxiety   . Anxiety   . Attention deficit disorder     Patient Active Problem List   Diagnosis Date Noted  . Anxiety 05/02/2012  . ADHD (attention deficit hyperactivity disorder), combined type 03/28/2012    Past Surgical History:  Procedure Laterality Date  . ADENOIDECTOMY    . TONSILLECTOMY    . WISDOM TOOTH EXTRACTION      OB History    No data available       Home Medications    Prior to Admission medications   Medication Sig Start Date End Date Taking? Authorizing Provider  cetirizine (ZYRTEC) 10 MG tablet Take 1 tablet (10 mg total) by mouth daily. 06/12/15   Carolann Littler, NP  hydrOXYzine (ATARAX/VISTARIL) 50 MG tablet Take 1 tablet (50 mg total) by mouth 3 (three) times daily as needed for anxiety. 06/12/15   Carolann Littler, NP  ranitidine (ZANTAC) 150 MG tablet Take 150 mg by mouth 2 (two) times daily.    Historical Provider, MD    Family History Family History  Problem Relation  Age of Onset  . Anxiety disorder Mother   . Drug abuse Father   . ADD / ADHD Father   . ADD / ADHD Sister   . Anxiety disorder Sister   . ADD / ADHD Brother   . ODD Brother   . Rheum arthritis Maternal Grandmother   . Kidney failure Maternal Grandmother   . Hypertension Maternal Grandfather   . Diabetes Maternal Grandfather   . Arthritis Maternal Grandfather   . Cancer Maternal Grandfather     Melanoma  . Diabetes Paternal Grandfather     Social History Social History  Substance Use Topics  . Smoking status: Current Every Day Smoker    Types: Cigarettes  . Smokeless tobacco: Never Used  . Alcohol use No     Allergies   Review of patient's allergies indicates no known allergies.   Review of Systems Review of Systems  Genitourinary: Positive for pelvic pain and vaginal bleeding.  All other systems reviewed and are negative.    Physical Exam Updated Vital Signs BP 131/87 (BP Location: Left Arm)   Pulse 106   Temp 98.5 F (36.9 C) (Oral)   Resp 20   Ht 5\' 2"  (1.575 m)   Wt 200 lb (90.7 kg)   LMP 11/01/2015   SpO2 100%   BMI 36.58 kg/m   Physical Exam  Constitutional: She is oriented  to person, place, and time. She appears well-developed and well-nourished.  HENT:  Head: Normocephalic and atraumatic.  Cardiovascular: Normal rate and regular rhythm.   Pulmonary/Chest: Effort normal. No respiratory distress.  Abdominal: Soft. There is no rebound and no guarding.  Mild suprapubic and right-sided pelvic tenderness  Genitourinary:  Genitourinary Comments: Scant brown vaginal discharge, no CMT or adnexal tenderness or fullness  Musculoskeletal: She exhibits no edema or tenderness.  Neurological: She is alert and oriented to person, place, and time.  Skin: Skin is warm and dry.  Psychiatric: She has a normal mood and affect. Her behavior is normal.  Nursing note and vitals reviewed.    ED Treatments / Results  Labs (all labs ordered are listed, but only  abnormal results are displayed) Labs Reviewed  URINALYSIS, ROUTINE W REFLEX MICROSCOPIC (NOT AT [facility]) - Abnormal; Notable for the following:       Result Value   APPearance CLOUDY (*)    Specific Gravity, Urine 1.037 (*)    Hgb urine dipstick LARGE (*)    All other components within normal limits  URINE MICROSCOPIC-ADD ON - Abnormal; Notable for the following:    Squamous Epithelial / LPF 6-30 (*)    Bacteria, UA RARE (*)    All other components within normal limits  PREGNANCY, URINE  GC/CHLAMYDIA PROBE AMP ([place]) NOT AT [facility]    EKG  EKG Interpretation None       Radiology No results found.  Procedures Procedures (including critical care time)  Medications Ordered in ED Medications - No data to display   Initial Impression / Assessment and Plan / ED Course  I have reviewed the triage vital signs and the nursing notes.  Pertinent labs & imaging results that were available during my care of the patient were reviewed by me and considered in my medical decision making (see chart for details).  Clinical Course    Patient here for evaluation of pelvic pain and vaginal bleeding. She has no significant bleeding on examination with minimal right-sided pelvic/suprapubic tenderness. Presentation is not consistent with appendicitis, PID, tubo-ovarian abscess, ovarian torsion. Discussed with patient home care for irregular cycles with pelvic pain. Recommend ibuprofen when necessary pain. Discussed close return precautions for any new or worsening symptoms.  Final Clinical Impressions(s) / ED Diagnoses   Final diagnoses:  Vaginal bleeding between periods    New Prescriptions New Prescriptions   No medications on file     Quintella Reichert, MD 11/13/15 [phone]

## 2015-11-13 NOTE — ED Triage Notes (Signed)
Pt c/o left side pelvic pain and some vaginal bleeding. Pt reports blood only on the tissue. Pt reports increased pain with palpation.

## 2016-02-20 ENCOUNTER — Emergency Department (HOSPITAL_COMMUNITY)
Admission: EM | Admit: 2016-02-20 | Discharge: 2016-02-20 | Disposition: A | Discharge status: Home / Self Care | Payer: Self-pay | Attending: Emergency Medicine | Admitting: Emergency Medicine

## 2016-02-20 ENCOUNTER — Encounter (HOSPITAL_COMMUNITY): Payer: Self-pay | Admitting: Emergency Medicine

## 2016-02-20 ENCOUNTER — Emergency Department (HOSPITAL_COMMUNITY): Payer: Self-pay

## 2016-02-20 DIAGNOSIS — Y9372 Activity, wrestling: Secondary | ICD-10-CM | POA: Insufficient documentation

## 2016-02-20 DIAGNOSIS — F17210 Nicotine dependence, cigarettes, uncomplicated: Secondary | ICD-10-CM | POA: Insufficient documentation

## 2016-02-20 DIAGNOSIS — Z79899 Other long term (current) drug therapy: Secondary | ICD-10-CM | POA: Insufficient documentation

## 2016-02-20 DIAGNOSIS — Y929 Unspecified place or not applicable: Secondary | ICD-10-CM | POA: Insufficient documentation

## 2016-02-20 DIAGNOSIS — Y999 Unspecified external cause status: Secondary | ICD-10-CM | POA: Insufficient documentation

## 2016-02-20 DIAGNOSIS — S62521A Displaced fracture of distal phalanx of right thumb, initial encounter for closed fracture: Secondary | ICD-10-CM | POA: Insufficient documentation

## 2016-02-20 DIAGNOSIS — F909 Attention-deficit hyperactivity disorder, unspecified type: Secondary | ICD-10-CM | POA: Insufficient documentation

## 2016-02-20 DIAGNOSIS — W231XXA Caught, crushed, jammed, or pinched between stationary objects, initial encounter: Secondary | ICD-10-CM | POA: Insufficient documentation

## 2016-02-20 DIAGNOSIS — S62524A Nondisplaced fracture of distal phalanx of right thumb, initial encounter for closed fracture: Secondary | ICD-10-CM

## 2016-02-20 NOTE — Discharge Instructions (Signed)
Rest, Ice intermittently (in the first 24-48 hours), Gentle compression with an Ace wrap, and elevate (Limb above the level of the heart)   Take up to 800mg of ibuprofen (that is usually 4 over the counter pills)  3 times a day for 5 days. Take with food.  

## 2016-02-20 NOTE — ED Provider Notes (Signed)
[facility] DEPT Provider Note   CSN: [id number] Arrival date & time: 02/20/16  1418  By signing my name below, I, Oleh Genin, attest that this documentation has been prepared under the direction and in the presence of [employer], PA-C.  Electronically Signed: Oleh Genin, ED Scribe. 02/20/16. 4:23 PM.  History   Chief Complaint Chief Complaint  Patient presents with  . Finger Injury   HPI Beverly Torres is a 21 y.o. female who presents to the ED with a sudden onset, gradually worsening right first digit pain s/p injury which occurred last night. This patient states that last night she was "wrestling with her brother" when a chair pinched her R 1st digit against the floor. Pain is 9/10 in severity and constant. She has limited range of motion in her R 1st digit. No OTC medications or home remedies tried PTA. Pt additionally notes that she had sustained a prior injury to the right hand in the past, and notes that she did fracture several bones at that time. She denies any decreased sensation distally. She denies any other injuries or complaints at this time. Tetanus is UTD.   The history is provided by the patient. No language interpreter was used.   Past Medical History:  Diagnosis Date  . Acid reflux   . Anxiety   . Anxiety   . Attention deficit disorder    Patient Active Problem List   Diagnosis Date Noted  . Anxiety 05/02/2012  . ADHD (attention deficit hyperactivity disorder), combined type 03/28/2012   Past Surgical History:  Procedure Laterality Date  . ADENOIDECTOMY    . TONSILLECTOMY    . WISDOM TOOTH EXTRACTION      OB History    No data available       Home Medications    Prior to Admission medications   Medication Sig Start Date End Date Taking? Authorizing Provider  hydrOXYzine (ATARAX/VISTARIL) 50 MG tablet Take 1 tablet (50 mg total) by mouth 3 (three) times daily as needed for anxiety. 06/12/15  Yes Dawn M Paretta-Leahey, NP  ranitidine  (ZANTAC) 150 MG tablet Take 150 mg by mouth 2 (two) times daily.   Yes Historical Provider, MD  cetirizine (ZYRTEC) 10 MG tablet Take 1 tablet (10 mg total) by mouth daily. Patient not taking: Reported on 02/20/2016 06/12/15   Carolann Littler, NP    Family History Family History  Problem Relation Age of Onset  . Anxiety disorder Mother   . Drug abuse Father   . ADD / ADHD Father   . ADD / ADHD Sister   . Anxiety disorder Sister   . ADD / ADHD Brother   . ODD Brother   . Rheum arthritis Maternal Grandmother   . Kidney failure Maternal Grandmother   . Hypertension Maternal Grandfather   . Diabetes Maternal Grandfather   . Arthritis Maternal Grandfather   . Cancer Maternal Grandfather     Melanoma  . Diabetes Paternal Grandfather     Social History Social History  Substance Use Topics  . Smoking status: Current Every Day Smoker    Types: Cigarettes  . Smokeless tobacco: Never Used  . Alcohol use No     Allergies   Patient has no known allergies.   Review of Systems Review of Systems A complete 10 system review of systems was obtained and all systems are negative except as noted in the HPI and PMH.    Physical Exam Updated Vital Signs BP (!) 120/54   Pulse  84   Temp 98.4 F (36.9 C) (Oral)   Resp 16   LMP 02/19/2016   SpO2 98%   Physical Exam  Constitutional: She is oriented to person, place, and time. She appears well-developed and well-nourished. No distress.  HENT:  Head: Normocephalic and atraumatic.  Mouth/Throat: Oropharynx is clear and moist.  Eyes: Conjunctivae and EOM are normal. Pupils are equal, round, and reactive to light.  Neck: Normal range of motion.  Cardiovascular: Normal rate, regular rhythm and intact distal pulses.   Pulmonary/Chest: Effort normal and breath sounds normal.  Abdominal: Soft. There is no tenderness.  Musculoskeletal: Normal range of motion. She exhibits edema and tenderness.   Right thumb with ecchymoses on the  interphalangeal joint significantly reduced range of motion on the interphalangeal joint but excellent range of motion to the MCP. Multiple superficial abrasions to the finger and hand, none of them are full-thickness. Distally neurovascularly intact.   Neurological: She is alert and oriented to person, place, and time.  Skin: She is not diaphoretic.  Psychiatric: She has a normal mood and affect.  Nursing note and vitals reviewed.  ED Treatments / Results  DIAGNOSTIC STUDIES: Oxygen Saturation is 98% on RA, normal by my interpretation.   COORDINATION OF CARE: 4:26 PM-Discussed next steps with pt. Pt verbalized understanding and is agreeable with the plan.   Labs (all labs ordered are listed, but only abnormal results are displayed) Labs Reviewed - No data to display  EKG  EKG Interpretation None      Radiology Dg Finger Thumb Right  Result Date: 02/20/2016 CLINICAL DATA:  Pt was wrestling with her brother last night and injured her right thumb; entire thumb is swollen with reddish appearance. Pt says most of the pain is between metacarpal phalangeal joint and interphalangeal joint of right thumb.*comment was truncated* EXAM: RIGHT THUMB 2+V COMPARISON:  None. FINDINGS: There is an oblique fracture through the base of the distal phalanx, first digit. Fracture is nondisplaced but enters the articular surface of the distal interphalangeal joint. IMPRESSION: Fracture at the base of the distal phalanx, first digit Electronically Signed   By: Suzy Bouchard M.D.   On: 02/20/2016 15:58   Procedures Procedures   Medications Ordered in ED Medications - No data to display  Initial Impression / Assessment and Plan / ED Course  I have reviewed the triage vital signs and the nursing notes.  Pertinent labs & imaging results that were available during my care of the patient were reviewed by me and considered in my medical decision making (see chart for details).  Clinical Course     Vitals:   02/20/16 1427 02/20/16 1718  BP: 121/65 (!) 120/54  Pulse: 102 84  Resp: 16 16  Temp: 98.4 F (36.9 C)   TempSrc: Oral   SpO2: 98% 98%    Medications - No data to display  Beverly Torres is 21 y.o. female presenting with Pain and swelling to right thumb after she was wrestling with her brother last night. This is a closed injury with very minor superficial abrasions. Extreme swelling and reduced range of motion to the interphalangeal joint. X-ray with a small fracture at the base of the distal phalanx however it extends into the articular surface. Discussed with attending who recommends splinting and outpatient follow-up. Patient given referral to Dr. Amedeo Plenty. She declines any pain medication in the ED.  Evaluation does not show pathology that would require ongoing emergent intervention or inpatient treatment. Pt is hemodynamically stable and  mentating appropriately. Discussed findings and plan with patient/guardian, who agrees with care plan. All questions answered. Return precautions discussed and outpatient follow up given.     Final Clinical Impressions(s) / ED Diagnoses   Final diagnoses:  Closed nondisplaced fracture of distal phalanx of right thumb, initial encounter    New Prescriptions Discharge Medication List as of 02/20/2016  5:11 PM     I personally performed the services described in this documentation, which was scribed in my presence. The recorded information has been reviewed and is accurate.     Monico Blitz, PA-C 02/20/16 [place], MD 03/01/16 [phone]

## 2016-02-20 NOTE — ED Notes (Signed)
Pt transported to Xray. 

## 2016-02-20 NOTE — ED Notes (Signed)
Pt stated she was playing with her cousin and had a chair fall on her right hand 02/19/15. Today it was swollen and reddish purple in color. Pt stated it hurts to bend

## 2016-02-20 NOTE — ED Triage Notes (Signed)
injuried her rt thumb last night . It is swollen and red

## 2016-02-20 NOTE — Progress Notes (Signed)
Orthopedic Tech Progress Note Patient Details:  Beverly Torres [facility] April 04, 1995 [id number]  Ortho Devices Type of Ortho Device: Arm sling, Finger splint Ortho Device/Splint Location: RUE thumb Ortho Device/Splint Interventions: Ordered, Application   Braulio Bosch 02/20/2016, 5:17 PM

## 2016-03-09 ENCOUNTER — Emergency Department (HOSPITAL_COMMUNITY)
Admission: EM | Admit: 2016-03-09 | Discharge: 2016-03-09 | Disposition: A | Discharge status: Home / Self Care | Payer: Medicaid Other | Attending: Emergency Medicine | Admitting: Emergency Medicine

## 2016-03-09 ENCOUNTER — Encounter (HOSPITAL_COMMUNITY): Payer: Self-pay | Admitting: Emergency Medicine

## 2016-03-09 DIAGNOSIS — F17210 Nicotine dependence, cigarettes, uncomplicated: Secondary | ICD-10-CM | POA: Insufficient documentation

## 2016-03-09 DIAGNOSIS — X58XXXD Exposure to other specified factors, subsequent encounter: Secondary | ICD-10-CM | POA: Insufficient documentation

## 2016-03-09 DIAGNOSIS — S6991XD Unspecified injury of right wrist, hand and finger(s), subsequent encounter: Secondary | ICD-10-CM | POA: Insufficient documentation

## 2016-03-09 DIAGNOSIS — F909 Attention-deficit hyperactivity disorder, unspecified type: Secondary | ICD-10-CM | POA: Insufficient documentation

## 2016-03-09 NOTE — ED Provider Notes (Signed)
[facility] DEPT Provider Note   CSN: [id number] Arrival date & time: 03/09/16  1600   By signing my name below, I, Beverly Torres, attest that this documentation has been prepared under the direction and in the presence of Etta Quill, [place]. Electronically Signed: Eunice Torres, Scribe. 03/09/16. 5:09 PM.   History   Chief Complaint Chief Complaint  Patient presents with  . Finger Injury   The history is provided by the patient and medical records. No language interpreter was used.    HPI Comments: Beverly Torres is a 21 y.o. female who presents to the Emergency Department complaining of right thumb pain related to an injury she sustained ~2 weeks ago. Pt here to be seen by Dr. Amedeo Plenty in consultation. She is currently in NAD.  Past Medical History:  Diagnosis Date  . Acid reflux   . Anxiety   . Anxiety   . Attention deficit disorder     Patient Active Problem List   Diagnosis Date Noted  . Anxiety 05/02/2012  . ADHD (attention deficit hyperactivity disorder), combined type 03/28/2012    Past Surgical History:  Procedure Laterality Date  . ADENOIDECTOMY    . TONSILLECTOMY    . WISDOM TOOTH EXTRACTION      OB History    No data available       Home Medications    Prior to Admission medications   Medication Sig Start Date End Date Taking? Authorizing Provider  cetirizine (ZYRTEC) 10 MG tablet Take 1 tablet (10 mg total) by mouth daily. Patient not taking: Reported on 02/20/2016 06/12/15   Carolann Littler, NP  hydrOXYzine (ATARAX/VISTARIL) 50 MG tablet Take 1 tablet (50 mg total) by mouth 3 (three) times daily as needed for anxiety. 06/12/15   Carolann Littler, NP  ranitidine (ZANTAC) 150 MG tablet Take 150 mg by mouth 2 (two) times daily.    Historical Provider, MD    Family History Family History  Problem Relation Age of Onset  . Anxiety disorder Mother   . Drug abuse Father   . ADD / ADHD Father   . ADD / ADHD Sister   . Anxiety disorder Sister     . ADD / ADHD Brother   . ODD Brother   . Rheum arthritis Maternal Grandmother   . Kidney failure Maternal Grandmother   . Hypertension Maternal Grandfather   . Diabetes Maternal Grandfather   . Arthritis Maternal Grandfather   . Cancer Maternal Grandfather     Melanoma  . Diabetes Paternal Grandfather     Social History Social History  Substance Use Topics  . Smoking status: Current Every Day Smoker    Types: Cigarettes  . Smokeless tobacco: Never Used  . Alcohol use No     Allergies   Patient has no known allergies.   Review of Systems Review of Systems  Musculoskeletal: Positive for arthralgias.  All other systems reviewed and are negative.  A complete 10 system review of systems was obtained and all systems are negative except as noted in the HPI and PMH.    Physical Exam Updated Vital Signs BP 118/79   Pulse 75   Temp 99 F (37.2 C)   Resp 16   LMP 02/19/2016   SpO2 100%   Physical Exam  Constitutional: She is oriented to person, place, and time. Vital signs are normal. She appears well-developed and well-nourished.  Non-toxic appearance. No distress.  Afebrile, nontoxic, NAD  HENT:  Head: Normocephalic and atraumatic.  Mouth/Throat: Mucous  membranes are normal.  Eyes: Conjunctivae and EOM are normal. Right eye exhibits no discharge. Left eye exhibits no discharge.  Neck: Normal range of motion. Neck supple.  Cardiovascular: Normal rate and intact distal pulses.   Pulmonary/Chest: Effort normal. No respiratory distress.  Abdominal: Normal appearance. She exhibits no distension.  Musculoskeletal: Normal range of motion.  Neurological: She is alert and oriented to person, place, and time. She has normal strength. No sensory deficit.  Skin: Skin is warm, dry and intact. No rash noted.  Psychiatric: She has a normal mood and affect. Her behavior is normal.  Nursing note and vitals reviewed.    ED Treatments / Results  DIAGNOSTIC STUDIES: Oxygen  Saturation is 100% on RA, normal by my interpretation.    COORDINATION OF CARE: 5:07 PM Discussed treatment plan with pt at bedside and pt agreed to plan. Will discharge. Pt advised to F/U with Dr. Amedeo Plenty in the office in 10 days.  Labs (all labs ordered are listed, but only abnormal results are displayed) Labs Reviewed - No data to display  EKG  EKG Interpretation None       Radiology No results found.  Procedures Procedures (including critical care time)  Medications Ordered in ED Medications - No data to display   Initial Impression / Assessment and Plan / ED Course  I have reviewed the triage vital signs and the nursing notes.  Pertinent labs & imaging results that were available during my care of the patient were reviewed by me and considered in my medical decision making (see chart for details).     Patient here for consultation with Dr. Amedeo Plenty. Screening exam completed, and patient seen by Dr. Amedeo Plenty. Dr. Amedeo Plenty evaluated patient's thumb injury and reapplied splint.  Pt advised to follow up in the office in 10 days per Dr. Vanetta Shawl instructions. Conservative therapy recommended and discussed. Patient will be discharged home & is agreeable with above plan. Returns precautions discussed. Pt appears safe for discharge.  Final Clinical Impressions(s) / ED Diagnoses   Final diagnoses:  Injury of finger of right hand, subsequent encounter    New Prescriptions New Prescriptions   No medications on file     Etta Quill, NP 03/09/16 [place], MD 03/10/16 1454

## 2016-03-09 NOTE — Consult Note (Signed)
Reason for Consult: Right thumb injury Referring Physician: ER physicians  Beverly Torres is an 21 y.o. female.  HPI: 21 year old female with history of ADD and anxiety and acid reflux presents after an injury to her thumb. She sustained injury to weeks ago. I reviewed her findings. She presents for evaluation. I've been asked to see and treat the thumb injury.  She denies other complaints. She denies neck back chest or abdominal pain.  Past Medical History:  Diagnosis Date  . Acid reflux   . Anxiety   . Anxiety   . Attention deficit disorder     Past Surgical History:  Procedure Laterality Date  . ADENOIDECTOMY    . TONSILLECTOMY    . WISDOM TOOTH EXTRACTION      Family History  Problem Relation Age of Onset  . Anxiety disorder Mother   . Drug abuse Father   . ADD / ADHD Father   . ADD / ADHD Sister   . Anxiety disorder Sister   . ADD / ADHD Brother   . ODD Brother   . Rheum arthritis Maternal Grandmother   . Kidney failure Maternal Grandmother   . Hypertension Maternal Grandfather   . Diabetes Maternal Grandfather   . Arthritis Maternal Grandfather   . Cancer Maternal Grandfather     Melanoma  . Diabetes Paternal Grandfather     Social History:  reports that she has been smoking Cigarettes.  She has never used smokeless tobacco. She reports that she does not drink alcohol. Her drug history is not on file.  Allergies: No Known Allergies  Medications: I have reviewed the patient's current medications.  No results found for this or any previous visit (from the past 48 hour(s)).  No results found.  Review of Systems  Constitutional: Negative.   HENT: Negative.   Eyes: Negative.   Cardiovascular: Negative.   Gastrointestinal: Negative.   Genitourinary: Negative.   Skin: Negative.   Neurological: Negative.   Endo/Heme/Allergies: Negative.    Blood pressure 118/79, pulse 75, temperature 99 F (37.2 C), resp. rate 16, last menstrual period 02/19/2016, SpO2 100  %. Physical Exam right thumb painful of DIP joint no evidence of infection no evidence of vascular compromise stable ligamentous exam she has a fracture intra-articularly in line with the IP joint. There is no dance displacement today. I reviewed all findings and her prior x-rays.  She has no evidence of dystrophy infection or compartment syndrome phenomenon.  I reviewed her findings at great length.  The patient is alert and oriented in no acute distress. The patient complains of pain in the affected upper extremity.  The patient is noted to have a normal HEENT exam. Lung fields show equal chest expansion and no shortness of breath. Abdomen exam is nontender without distention. Lower extremity examination does not show any fracture dislocation or blood clot symptoms. Pelvis is stable and the neck and back are stable and nontender.    Assessment/Plan: Right thumb interarticular fracture at the IP joint  After thorough evaluation and manipulative range of motion I feel she is stable. I've given her tube gauze and a Coban wrap regime. I went over the plans of general immobilization with the comfort sleeve for the next week and a half. I will then see her in my office with AP and lateral x-rays. I discussed with her that I do feel she is stable and should do well. She can begin aggressive range of motion in 1-1/2 weeks after her x-rays.  I  dispensed supplies for her day went over do's and don'ts and other berries imponderables.  I spent greater than 10 minutes counseling her in regards to tobacco cessation.  Amanda Steuart III,Beverly Torres 03/09/2016, 5:10 PM

## 2016-03-09 NOTE — ED Triage Notes (Signed)
Pt states "i broke my thumb a week ago and I was told to come here to see Doctor Gramig".

## 2016-03-09 NOTE — ED Notes (Signed)
Dr. Amedeo Plenty office notified patient is here

## 2016-04-12 ENCOUNTER — Emergency Department (HOSPITAL_COMMUNITY)
Admission: EM | Admit: 2016-04-12 | Discharge: 2016-04-12 | Disposition: A | Discharge status: Home / Self Care | Payer: Medicaid Other | Attending: Emergency Medicine | Admitting: Emergency Medicine

## 2016-04-12 ENCOUNTER — Encounter (HOSPITAL_COMMUNITY): Payer: Self-pay | Admitting: Emergency Medicine

## 2016-04-12 DIAGNOSIS — R1031 Right lower quadrant pain: Secondary | ICD-10-CM

## 2016-04-12 DIAGNOSIS — F17210 Nicotine dependence, cigarettes, uncomplicated: Secondary | ICD-10-CM | POA: Insufficient documentation

## 2016-04-12 DIAGNOSIS — F909 Attention-deficit hyperactivity disorder, unspecified type: Secondary | ICD-10-CM | POA: Insufficient documentation

## 2016-04-12 LAB — CBC
HEMATOCRIT: 41.2 % (ref 36.0–46.0)
Hemoglobin: 14.4 g/dL (ref 12.0–15.0)
MCH: 29.9 pg (ref 26.0–34.0)
MCHC: 35 g/dL (ref 30.0–36.0)
MCV: 85.7 fL (ref 78.0–100.0)
Platelets: 211 10*3/uL (ref 150–400)
RBC: 4.81 MIL/uL (ref 3.87–5.11)
RDW: 12.5 % (ref 11.5–15.5)
WBC: 10.7 10*3/uL — ABNORMAL HIGH (ref 4.0–10.5)

## 2016-04-12 LAB — URINALYSIS, ROUTINE W REFLEX MICROSCOPIC
BILIRUBIN URINE: NEGATIVE
Glucose, UA: NEGATIVE mg/dL
Ketones, ur: NEGATIVE mg/dL
NITRITE: NEGATIVE
PH: 7 (ref 5.0–8.0)
Protein, ur: NEGATIVE mg/dL
Specific Gravity, Urine: 1.017 (ref 1.005–1.030)

## 2016-04-12 LAB — COMPREHENSIVE METABOLIC PANEL
ALT: 45 U/L (ref 14–54)
ANION GAP: 7 (ref 5–15)
AST: 26 U/L (ref 15–41)
Albumin: 4.1 g/dL (ref 3.5–5.0)
Alkaline Phosphatase: 68 U/L (ref 38–126)
BUN: 10 mg/dL (ref 6–20)
CALCIUM: 9.8 mg/dL (ref 8.9–10.3)
CHLORIDE: 107 mmol/L (ref 101–111)
CO2: 25 mmol/L (ref 22–32)
Creatinine, Ser: 0.83 mg/dL (ref 0.44–1.00)
GFR calc non Af Amer: >60 mL/min (ref >60)
Glucose, Bld: 125 mg/dL — ABNORMAL HIGH (ref 65–99)
Potassium: 3.7 mmol/L (ref 3.5–5.1)
SODIUM: 139 mmol/L (ref 135–145)
Total Bilirubin: 0.7 mg/dL (ref 0.3–1.2)
Total Protein: 7.2 g/dL (ref 6.5–8.1)

## 2016-04-12 LAB — I-STAT BETA HCG BLOOD, ED (MC, WL, AP ONLY): I-stat hCG, quantitative: <5 m[IU]/mL (ref <5)

## 2016-04-12 LAB — LIPASE, BLOOD: LIPASE: 28 U/L (ref 11–51)

## 2016-04-12 MED ORDER — DOCUSATE SODIUM 250 MG PO CAPS: 250.0000 mg | ORAL_CAPSULE | Freq: Every day | ORAL | 0 refills | Status: AC

## 2016-04-12 MED ORDER — POLYETHYLENE GLYCOL 3350 17 G PO PACK: 17.0000 g | PACK | Freq: Every day | ORAL | 0 refills | Status: AC

## 2016-04-12 NOTE — ED Notes (Signed)
Pt ambulated to room from waiting room, tolerated well. 

## 2016-04-12 NOTE — ED Provider Notes (Signed)
[facility] DEPT Provider Note   CSN: [id number] Arrival date & time: 04/12/16  1500     History   Chief Complaint Chief Complaint  Patient presents with  . Abdominal Pain    HPI Beverly Torres is a 21 y.o. obese, well-appearing Caucasian female who presents to the ED today with chief complaint of RLQ pain.   Pt states pain began at 2am last night and is a constant 4/10 crampy, dull pain that at times radiates to the suprapubic region. She has not taken any medications or done anything for the pain other than "pressing on my belly". Nothing alleviates the pain and nothing aggravates it. Associated with mild nausea, but denies vomiting. Last BM was 2 days ago, which patient states is normal as she has constipation-type IBS, but states she "feels pretty constipated". States she is not currently sexually active and currently on day 3 of her menstrual cycle. Reports her current symptoms are consistent with her regular PMS symptoms. Endorses some LBP, also consistent with her menstrual cycle.   Denies fever/chills, CP/SOB, HA, dysuria, hematuria, hematochezia, abnormal vaginal discharge/itching   The history is provided by the patient.    Past Medical History:  Diagnosis Date  . Acid reflux   . Anxiety   . Anxiety   . Attention deficit disorder     Patient Active Problem List   Diagnosis Date Noted  . Anxiety 05/02/2012  . ADHD (attention deficit hyperactivity disorder), combined type 03/28/2012    Past Surgical History:  Procedure Laterality Date  . ADENOIDECTOMY    . TONSILLECTOMY    . WISDOM TOOTH EXTRACTION      OB History    No data available       Home Medications    Prior to Admission medications   Medication Sig Start Date End Date Taking? Authorizing Provider  cetirizine (ZYRTEC) 10 MG tablet Take 1 tablet (10 mg total) by mouth daily. Patient not taking: Reported on 02/20/2016 06/12/15   Carolann Littler, NP  docusate sodium (COLACE) 250 MG capsule  Take 1 capsule (250 mg total) by mouth daily. 04/12/16 04/19/16  Renita Papa, PA  hydrOXYzine (ATARAX/VISTARIL) 50 MG tablet Take 1 tablet (50 mg total) by mouth 3 (three) times daily as needed for anxiety. 06/12/15   Carolann Littler, NP  polyethylene glycol (MIRALAX / GLYCOLAX) packet Take 17 g by mouth daily. 04/12/16 04/19/16  Renita Papa, PA  ranitidine (ZANTAC) 150 MG tablet Take 150 mg by mouth 2 (two) times daily.    Historical Provider, MD    Family History Family History  Problem Relation Age of Onset  . Anxiety disorder Mother   . Drug abuse Father   . ADD / ADHD Father   . ADD / ADHD Sister   . Anxiety disorder Sister   . ADD / ADHD Brother   . ODD Brother   . Rheum arthritis Maternal Grandmother   . Kidney failure Maternal Grandmother   . Hypertension Maternal Grandfather   . Diabetes Maternal Grandfather   . Arthritis Maternal Grandfather   . Cancer Maternal Grandfather     Melanoma  . Diabetes Paternal Grandfather     Social History Social History  Substance Use Topics  . Smoking status: Current Every Day Smoker    Types: Cigarettes  . Smokeless tobacco: Never Used  . Alcohol use No     Allergies   Patient has no known allergies.   Review of Systems Review of Systems  Constitutional: Negative for chills and fever.  HENT: Negative.   Respiratory: Negative for chest tightness and shortness of breath.   Cardiovascular: Negative for chest pain.  Gastrointestinal: Positive for abdominal pain, constipation and nausea. Negative for abdominal distention, anal bleeding, blood in stool, diarrhea and vomiting.  Genitourinary: Positive for menstrual problem and vaginal bleeding. Negative for difficulty urinating, dysuria, flank pain, urgency, vaginal discharge and vaginal pain.  Musculoskeletal: Positive for back pain. Negative for arthralgias and myalgias.  Skin: Negative.   Neurological: Negative for dizziness and headaches.  Hematological: Negative.     Psychiatric/Behavioral: Negative.      Physical Exam Updated Vital Signs BP 120/60 (BP Location: Right Arm)   Pulse 87   Temp 98.2 F (36.8 C) (Oral)   Resp 18   SpO2 100%   Physical Exam  Constitutional: She is oriented to person, place, and time. She appears well-developed and well-nourished. No distress.  HENT:  Head: Normocephalic and atraumatic.  Eyes: Conjunctivae are normal. Right eye exhibits no discharge. Left eye exhibits no discharge. No scleral icterus.  Neck: No tracheal deviation present.  Cardiovascular: Normal rate, regular rhythm and normal heart sounds.  Exam reveals no gallop and no friction rub.   No murmur heard. Pulmonary/Chest: Effort normal and breath sounds normal. No respiratory distress.  Abdominal: Soft. She exhibits no distension. There is tenderness. There is no rebound and no guarding.  Obese, striae present, hypoactive bowel sounds. No surgical scars observed. Mild RLQ pain to palpation. Negative Rovsing's, Murphy's, no TTP at McBurney's point, no pain jumping on one foot.   Neurological: She is alert and oriented to person, place, and time.  Skin: Skin is warm and dry. No rash noted. She is not diaphoretic. No erythema.  Psychiatric: She has a normal mood and affect. Her behavior is normal. Judgment and thought content normal.     ED Treatments / Results  Labs (all labs ordered are listed, but only abnormal results are displayed) Labs Reviewed  COMPREHENSIVE METABOLIC PANEL - Abnormal; Notable for the following:       Result Value   Glucose, Bld 125 (*)    All other components within normal limits  URINALYSIS, ROUTINE W REFLEX MICROSCOPIC - Abnormal; Notable for the following:    APPearance HAZY (*)    Hgb urine dipstick LARGE (*)    Leukocytes, UA TRACE (*)    Bacteria, UA RARE (*)    Squamous Epithelial / LPF 0-5 (*)    All other components within normal limits  CBC - Abnormal; Notable for the following:    WBC 10.7 (*)    All other  components within normal limits  LIPASE, BLOOD  I-STAT BETA HCG BLOOD, ED ([facility], [facility], [facility] ONLY)    EKG  EKG Interpretation None       Radiology No results found.  Procedures Procedures (including critical care time)  Medications Ordered in ED Medications - No data to display   Initial Impression / Assessment and Plan / ED Course  I have reviewed the triage vital signs and the nursing notes.  Pertinent labs & imaging results that were available during my care of the patient were reviewed by me and considered in my medical decision making (see chart for details).     Verdia Kuba presents to the ED with chief complaint of RLQ pain beginning last night at 2:00am. Pt is afebrile, well-appearing, and states her current symptoms are consistent with her typical PMS symptoms and possible cramping due to constipation, which  is also normal for her. Do not suspect appendicitis due to vitals, physical exam with no focal abdominal tenderness, no peritonitis or evidence of acute abdomen, and pt history. B-HCG negative, ruling out ectopic pregnancy. Doubt ovarian torsion, ovarian cyst remains a possibility, but is thought to be less likely given no tenderness.  UA is inconsistent with infection. WBC mildly elevated, likely a non-specific finding. Based on pt Hx, PE, and labwork, pt's pain likely related to menstrual cramps. Discussed abdominal pain in detail with patient, at this time do not feel that advanced imaging is indicated.  Discussed this with the patient, who understands that if her symptoms worsen, she may return to the ED and may be evaluated with imaging at that time if indicated. Will provide mirilax and colace for comfort.  Final Clinical Impressions(s) / ED Diagnoses   Final diagnoses:  Right lower quadrant abdominal pain    New Prescriptions Discharge Medication List as of 04/12/2016  6:52 PM    START taking these medications   Details  docusate sodium (COLACE) 250 MG capsule Take 1  capsule (250 mg total) by mouth daily., Starting Mon 04/12/2016, Until Mon 04/19/2016, Print    polyethylene glycol (MIRALAX / GLYCOLAX) packet Take 17 g by mouth daily., Starting Mon 04/12/2016, Until Mon 04/19/2016, [place], [place] 04/12/16 2004    Tanna Furry, MD 04/28/16 1147

## 2016-04-12 NOTE — ED Triage Notes (Signed)
Pt sts RLQ pain

## 2016-06-26 ENCOUNTER — Emergency Department (HOSPITAL_COMMUNITY)
Admission: EM | Admit: 2016-06-26 | Discharge: 2016-06-26 | Disposition: A | Discharge status: Home / Self Care | Payer: Self-pay | Attending: Emergency Medicine | Admitting: Emergency Medicine

## 2016-06-26 ENCOUNTER — Encounter (HOSPITAL_COMMUNITY): Payer: Self-pay | Admitting: Emergency Medicine

## 2016-06-26 ENCOUNTER — Emergency Department (HOSPITAL_COMMUNITY): Payer: Self-pay

## 2016-06-26 DIAGNOSIS — F17210 Nicotine dependence, cigarettes, uncomplicated: Secondary | ICD-10-CM | POA: Insufficient documentation

## 2016-06-26 DIAGNOSIS — R072 Precordial pain: Secondary | ICD-10-CM | POA: Insufficient documentation

## 2016-06-26 DIAGNOSIS — Z79899 Other long term (current) drug therapy: Secondary | ICD-10-CM | POA: Insufficient documentation

## 2016-06-26 DIAGNOSIS — F902 Attention-deficit hyperactivity disorder, combined type: Secondary | ICD-10-CM | POA: Insufficient documentation

## 2016-06-26 DIAGNOSIS — N9489 Other specified conditions associated with female genital organs and menstrual cycle: Secondary | ICD-10-CM | POA: Insufficient documentation

## 2016-06-26 LAB — CBC
HEMATOCRIT: 42.4 % (ref 36.0–46.0)
HEMOGLOBIN: 14.3 g/dL (ref 12.0–15.0)
MCH: 29.6 pg (ref 26.0–34.0)
MCHC: 33.7 g/dL (ref 30.0–36.0)
MCV: 87.8 fL (ref 78.0–100.0)
Platelets: 342 10*3/uL (ref 150–400)
RBC: 4.83 MIL/uL (ref 3.87–5.11)
RDW: 12.6 % (ref 11.5–15.5)
WBC: 12.5 10*3/uL — ABNORMAL HIGH (ref 4.0–10.5)

## 2016-06-26 LAB — BASIC METABOLIC PANEL
ANION GAP: 9 (ref 5–15)
BUN: 10 mg/dL (ref 6–20)
CALCIUM: 9.9 mg/dL (ref 8.9–10.3)
CO2: 25 mmol/L (ref 22–32)
Chloride: 103 mmol/L (ref 101–111)
Creatinine, Ser: 0.8 mg/dL (ref 0.44–1.00)
GFR calc Af Amer: >60 mL/min (ref >60)
Glucose, Bld: 122 mg/dL — ABNORMAL HIGH (ref 65–99)
POTASSIUM: 4 mmol/L (ref 3.5–5.1)
SODIUM: 137 mmol/L (ref 135–145)

## 2016-06-26 LAB — HCG, QUANTITATIVE, PREGNANCY

## 2016-06-26 LAB — I-STAT TROPONIN, ED: TROPONIN I, POC: 0 ng/mL (ref 0.00–0.08)

## 2016-06-26 MED ORDER — HYDROXYZINE HCL 25 MG PO TABS
25.0000 mg | ORAL_TABLET | Freq: Once | ORAL | Status: AC
  Administered 2016-06-26: 25 mg via ORAL
  Filled 2016-06-26: qty 1

## 2016-06-26 NOTE — ED Triage Notes (Signed)
Pt reports chest pain but thinks "it might be my anxiety".  Denies any N/V/D, SOB, lightness.   States her left arm when numb earlier.

## 2016-06-26 NOTE — Discharge Instructions (Signed)
Please read and follow all provided instructions.  Your diagnoses today include:  1. Precordial chest pain     Tests performed today include:  An EKG of your heart  A chest x-ray  Cardiac enzymes - a blood test for heart muscle damage  Blood counts and electrolytes  Vital signs. See below for your results today.   Medications prescribed:   None  Take any prescribed medications only as directed.  Follow-up instructions: Please follow-up with your primary care provider as soon as you can for further evaluation of your symptoms.   Return instructions:  SEEK IMMEDIATE MEDICAL ATTENTION IF:  Your chest pain gets worse and does not go away.   You have an attack of chest pain lasting longer than usual, despite rest and treatment with the medications your caregiver has prescribed.   You wake from sleep with chest pain or shortness of breath.  You feel dizzy or faint.  You have chest pain not typical of your usual pain for which you originally saw your caregiver.   You have any other emergent concerns regarding your health.  Additional Information: Chest pain comes from many different causes. Your caregiver has diagnosed you as having chest pain that is not specific for one problem, but does not require admission.  You are at low risk for an acute heart condition or other serious illness.   Your vital signs today were: BP 104/65 (BP Location: Right Arm)    Pulse 88    Temp 98.6 F (37 C) (Oral)    Resp 18    Ht 5\' 3"  (1.6 m)    SpO2 100%  If your blood pressure (BP) was elevated above 135/85 this visit, please have this repeated by your doctor within one month. --------------

## 2016-06-26 NOTE — ED Provider Notes (Signed)
[facility] DEPT Provider Note   CSN: [id number] Arrival date & time: 06/26/16  0439     History   Chief Complaint Chief Complaint  Patient presents with  . Anxiety  . Chest Pain    HPI Beverly Torres is a 21 y.o. female.  Patient with history of anxiety on hydroxyzine, acid reflux -- presents with complaint of burning sensation in the chest with an uncomfortable feeling in her left arm. No associated chest pains, shortness of breath. No diaphoresis or vomiting. Patient states that she has had this sensation with similar panic attacks in the past, however she stated she "wanted to be sure". Patient denies risk factors for pulmonary embolism including: unilateral leg swelling, history of DVT/PE/other blood clots, use of exogenous hormones, recent immobilizations, recent surgery, recent travel (>4hr segment), malignancy, hemoptysis. No recent fevers, chills, cough. No abdominal pain or diarrhea. Patient takes hydroxyzine as needed, last dose was yesterday afternoon. At time of exam, she states that she is feeling better. Symptoms have resolved. No history of hypertension, high cholesterol, diabetes, drug use, family history of sudden cardiac death at young age. Patient does smoke.       Past Medical History:  Diagnosis Date  . Acid reflux   . Anxiety   . Anxiety   . Attention deficit disorder     Patient Active Problem List   Diagnosis Date Noted  . Anxiety 05/02/2012  . ADHD (attention deficit hyperactivity disorder), combined type 03/28/2012    Past Surgical History:  Procedure Laterality Date  . ADENOIDECTOMY    . TONSILLECTOMY    . WISDOM TOOTH EXTRACTION      OB History    No data available       Home Medications    Prior to Admission medications   Medication Sig Start Date End Date Taking? Authorizing Provider  cetirizine (ZYRTEC) 10 MG tablet Take 1 tablet (10 mg total) by mouth daily. Patient not taking: Reported on 02/20/2016 06/12/15   Paretta-Leahey,  Haze Boyden, NP  hydrOXYzine (ATARAX/VISTARIL) 50 MG tablet Take 1 tablet (50 mg total) by mouth 3 (three) times daily as needed for anxiety. 06/12/15   Paretta-Leahey, Haze Boyden, NP  ranitidine (ZANTAC) 150 MG tablet Take 150 mg by mouth 2 (two) times daily.    [provider]    Family History Family History  Problem Relation Age of Onset  . Anxiety disorder Mother   . Drug abuse Father   . ADD / ADHD Father   . ADD / ADHD Sister   . Anxiety disorder Sister   . ADD / ADHD Brother   . ODD Brother   . Rheum arthritis Maternal Grandmother   . Kidney failure Maternal Grandmother   . Hypertension Maternal Grandfather   . Diabetes Maternal Grandfather   . Arthritis Maternal Grandfather   . Cancer Maternal Grandfather        Melanoma  . Diabetes Paternal Grandfather     Social History Social History  Substance Use Topics  . Smoking status: Current Every Day Smoker    Types: Cigarettes  . Smokeless tobacco: Never Used  . Alcohol use No     Allergies   Patient has no known allergies.   Review of Systems Review of Systems  Constitutional: Negative for diaphoresis and fever.  Eyes: Negative for redness.  Respiratory: Negative for cough and shortness of breath.   Cardiovascular: Positive for chest pain (Burning). Negative for palpitations and leg swelling.  Gastrointestinal: Negative for abdominal pain,  nausea and vomiting.  Genitourinary: Negative for dysuria.  Musculoskeletal: Negative for back pain and neck pain.  Skin: Negative for rash.  Neurological: Negative for syncope and light-headedness.  Psychiatric/Behavioral: The patient is nervous/anxious.      Physical Exam Updated Vital Signs BP 114/67 (BP Location: Right Arm)   Pulse (!) 118   Temp 98 F (36.7 C) (Oral)   Ht 5\' 3"  (1.6 m)   SpO2 99%   Physical Exam  Constitutional: She appears well-developed and well-nourished.  HENT:  Head: Normocephalic and atraumatic.  Mouth/Throat: Oropharynx is clear and  moist.  Eyes: Conjunctivae are normal.  Neck: Normal range of motion. Neck supple.  Cardiovascular: Normal rate, regular rhythm and normal heart sounds.  Exam reveals no friction rub.   No murmur heard. Heart rate improved into the 80s  Pulmonary/Chest: Effort normal and breath sounds normal. No respiratory distress. She has no wheezes. She has no rales.  Lungs clear auscultation bilaterally, good air movement.  Abdominal: Soft. She exhibits no mass. There is no tenderness. There is no guarding.  Neurological: She is alert.  Skin: Skin is warm and dry.  Psychiatric: Her mood appears anxious.  Nursing note and vitals reviewed.    ED Treatments / Results  Labs (all labs ordered are listed, but only abnormal results are displayed) Labs Reviewed  BASIC METABOLIC PANEL - Abnormal; Notable for the following:       Result Value   Glucose, Bld 122 (*)    All other components within normal limits  CBC - Abnormal; Notable for the following:    WBC 12.5 (*)    All other components within normal limits  HCG, QUANTITATIVE, PREGNANCY  I-STAT TROPOININ, ED   ED ECG REPORT   Date: 06/26/2016  Rate: 114  Rhythm: sinus tachycardia  QRS Axis: right  Intervals: normal  ST/T Wave abnormalities: nonspecific T wave changes  Conduction Disutrbances:none  Narrative Interpretation:   Old EKG Reviewed: changes noted, previous EKG t-waves in III are upright, rate is slower today  I have personally reviewed the EKG tracing and agree with the computerized printout as noted.    Radiology Dg Chest 2 View  Result Date: 06/26/2016 CLINICAL DATA:  Chest pain EXAM: CHEST  2 VIEW COMPARISON:  Chest radiograph 03/17/2014 FINDINGS: The heart size and mediastinal contours are within normal limits. Both lungs are clear. The visualized skeletal structures are unremarkable. IMPRESSION: No active cardiopulmonary disease. Electronically Signed   By: Ulyses Jarred M.D.   On: 06/26/2016 05:58     Procedures Procedures (including critical care time)  Medications Ordered in ED Medications  hydrOXYzine (ATARAX/VISTARIL) tablet 25 mg (not administered)     Initial Impression / Assessment and Plan / ED Course  I have reviewed the triage vital signs and the nursing notes.  Pertinent labs & imaging results that were available during my care of the patient were reviewed by me and considered in my medical decision making (see chart for details).     Patient seen and examined. EKG reviewed. Work-up reviewed with patient. Medications ordered.   Vital signs reviewed and are as follows: BP 104/65 (BP Location: Right Arm)   Pulse 88   Temp 98.6 F (37 C) (Oral)   Resp 18   Ht 5\' 3"  (1.6 m)   SpO2 100%   Patient is feeling better. Symptoms are resolved. Heart rate is in the 80s. Normal exam except she is somewhat anxious appearing. No risk factors or objective findings for PE and  I do not feel that screening is indicated at this time.  Patient was counseled to return with severe chest pain, especially if the pain is crushing or pressure-like and spreads to the arms, back, neck, or jaw, or if they have sweating, nausea, or shortness of breath with the pain. They were encouraged to call 911 with these symptoms.   They were also told to return if their chest pain gets worse and does not go away with rest, they have an attack of chest pain lasting longer than usual despite rest and treatment with the medications their caregiver has prescribed, if they wake from sleep with chest pain or shortness of breath, if they feel dizzy or faint, if they have chest pain not typical of their usual pain, or if they have any other emergent concerns regarding their health.  The patient verbalized understanding and agreed.    Final Clinical Impressions(s) / ED Diagnoses   Final diagnoses:  Precordial chest pain   Work-up as above, reassuring. Low concern for PE/ACS. Symptoms improved with  improving anxiety.    New Prescriptions New Prescriptions   No medications on file     Carlisle Cater, Hershal Coria 06/26/16 0383    Daleen Bo, MD 06/26/16 [phone]

## 2016-07-04 ENCOUNTER — Emergency Department (HOSPITAL_COMMUNITY): Payer: Self-pay

## 2016-07-04 ENCOUNTER — Encounter (HOSPITAL_COMMUNITY): Payer: Self-pay

## 2016-07-04 ENCOUNTER — Emergency Department (HOSPITAL_COMMUNITY)
Admission: EM | Admit: 2016-07-04 | Discharge: 2016-07-05 | Disposition: A | Discharge status: Home / Self Care | Payer: Self-pay | Attending: Emergency Medicine | Admitting: Emergency Medicine

## 2016-07-04 DIAGNOSIS — F17210 Nicotine dependence, cigarettes, uncomplicated: Secondary | ICD-10-CM | POA: Insufficient documentation

## 2016-07-04 DIAGNOSIS — J069 Acute upper respiratory infection, unspecified: Secondary | ICD-10-CM | POA: Insufficient documentation

## 2016-07-04 DIAGNOSIS — Z79899 Other long term (current) drug therapy: Secondary | ICD-10-CM | POA: Insufficient documentation

## 2016-07-04 NOTE — ED Triage Notes (Signed)
Pt presents to the ed for multiple complaints, cough, "aching", sore throat, headache, and ear pain.  States that she was just here for chest pain and was told everything was okay with her heart but now she feels generally ill.

## 2016-07-04 NOTE — ED Provider Notes (Signed)
[facility] DEPT Provider Note   CSN: [id number] Arrival date & time: 07/04/16  2229  By signing my name below, I, Beverly Torres, attest that this documentation has been prepared under the direction and in the presence of [name] [name], PA-C.  Electronically Signed: Lise Auer, ED Scribe. 07/04/16. 11:51 PM.   History   Chief Complaint Chief Complaint  Patient presents with  . Cough   The history is provided by the patient. No language interpreter was used.   HPI Comments: Beverly Torres is a 21 y.o. female with no pertinent PMHx, who presents to the Emergency Department with URI symptoms 1 week. Patient reports that she has experienced a productive cough, congestion, rhinorrhea, ear pressure, headaches for the past week. She reports some chest pain secondary to coughing. She reports that her chest pain is a "ache" and occurs mostly with coughing. Patient states that her cough is productive of some green and yellow sputum. Patient tried over-the-counter cough syrup for 1 day but did not have any relief. She denies trying any other medications. She reports that she has recently been exposed to sick contacts. Patient reports that she smokes a half pack of cigarettes a day. She denies any marijuana, cocaine or heroine use.Denies shortness of breath, abdominal pain, nausea, vomiting, dysuria, hematuria. Patient reports the child history of asthma but no known asthma exacerbations as an adult. She does not use an inhaler.   Past Medical History:  Diagnosis Date  . Acid reflux   . Anxiety   . Anxiety   . Attention deficit disorder     Patient Active Problem List   Diagnosis Date Noted  . Anxiety 05/02/2012  . ADHD (attention deficit hyperactivity disorder), combined type 03/28/2012    Past Surgical History:  Procedure Laterality Date  . ADENOIDECTOMY    . TONSILLECTOMY    . WISDOM TOOTH EXTRACTION      OB History    No data available      Home Medications    Prior to  Admission medications   Medication Sig Start Date End Date Taking? Authorizing Provider  cetirizine (ZYRTEC) 10 MG tablet Take 1 tablet (10 mg total) by mouth daily. Patient not taking: Reported on 02/20/2016 06/12/15   Paretta-Leahey, Haze Boyden, NP  fluticasone (FLONASE) 50 MCG/ACT nasal spray Place 1 spray into both nostrils daily. 07/05/16   Volanda Napoleon, PA-C  hydrOXYzine (ATARAX/VISTARIL) 50 MG tablet Take 1 tablet (50 mg total) by mouth 3 (three) times daily as needed for anxiety. 06/12/15   Paretta-Leahey, Haze Boyden, NP  ranitidine (ZANTAC) 150 MG tablet Take 150 mg by mouth 2 (two) times daily.    [provider]   Family History Family History  Problem Relation Age of Onset  . Anxiety disorder Mother   . Drug abuse Father   . ADD / ADHD Father   . ADD / ADHD Sister   . Anxiety disorder Sister   . ADD / ADHD Brother   . ODD Brother   . Rheum arthritis Maternal Grandmother   . Kidney failure Maternal Grandmother   . Hypertension Maternal Grandfather   . Diabetes Maternal Grandfather   . Arthritis Maternal Grandfather   . Cancer Maternal Grandfather        Melanoma  . Diabetes Paternal Grandfather     Social History Social History  Substance Use Topics  . Smoking status: Current Every Day Smoker    Types: Cigarettes  . Smokeless tobacco: Never Used  . Alcohol use  No    Allergies   Patient has no known allergies.  Review of Systems Review of Systems  Constitutional: Positive for fever (Subjective).  HENT: Positive for congestion, ear pain and rhinorrhea.   Respiratory: Positive for cough. Negative for shortness of breath.   Cardiovascular: Positive for chest pain.  Gastrointestinal: Negative for abdominal pain, nausea and vomiting.  Genitourinary: Negative for dysuria and hematuria.  Neurological: Positive for headaches.  All other systems reviewed and are negative.  Physical Exam Updated Vital Signs BP 131/71 (BP Location: Left Arm)   Pulse 80   Temp  98.6 F (37 C) (Oral)   Resp 18   SpO2 100%   Physical Exam  Constitutional: She appears well-developed and well-nourished.  Patient is walking around comfortably in room. No acute distress.  HENT:  Head: Normocephalic and atraumatic.  Right Ear: Tympanic membrane normal. Tympanic membrane is not injected and not erythematous.  Left Ear: Tympanic membrane normal. Tympanic membrane is not injected and not erythematous.  Mouth/Throat: Oropharynx is clear and moist and mucous membranes are normal. No oropharyngeal exudate, posterior oropharyngeal edema or posterior oropharyngeal erythema.  Bilateral external auditory canals are very ceruminous but TMs are able to be visualized.  Eyes: Conjunctivae and EOM are normal. Pupils are equal, round, and reactive to light. Right eye exhibits no discharge. Left eye exhibits no discharge. No scleral icterus.  Cardiovascular: Normal rate and regular rhythm.  Exam reveals no gallop and no friction rub.   No murmur heard. Pulses:      Radial pulses are 2+ on the right side, and 2+ on the left side.  Pulmonary/Chest: Effort normal and breath sounds normal. She has no wheezes. She has no rales.  No evidence of respiratory distress. Able to speak in full sentences without difficulty.  Neurological: She is alert.  Skin: Skin is warm and dry.  Psychiatric: She has a normal mood and affect. Her speech is normal and behavior is normal.  Nursing note and vitals reviewed.    ED Treatments / Results  DIAGNOSTIC STUDIES: Oxygen Saturation is 100% on RA, normal by my interpretation.   COORDINATION OF CARE: 11:28 PM-Discussed next steps with pt. Pt verbalized understanding and is agreeable with the plan.   Labs (all labs ordered are listed, but only abnormal results are displayed) Labs Reviewed - No data to display  EKG  EKG Interpretation None       Radiology Dg Chest 2 View  Result Date: 07/04/2016 CLINICAL DATA:  21 year old female with cough  and shortness of breath. EXAM: CHEST  2 VIEW COMPARISON:  Chest radiograph dated 06/26/2016 FINDINGS: The heart size and mediastinal contours are within normal limits. Both lungs are clear. The visualized skeletal structures are unremarkable. IMPRESSION: No active cardiopulmonary disease. Electronically Signed   By: Anner Crete M.D.   On: 07/04/2016 23:49    Procedures Procedures (including critical care time)  Medications Ordered in ED Medications - No data to display   Initial Impression / Assessment and Plan / ED Course  I have reviewed the triage vital signs and the nursing notes.  Pertinent labs & imaging results that were available during my care of the patient were reviewed by me and considered in my medical decision making (see chart for details).     21 year old female with URI symptoms 1 week. Tried over-the-counter medications for one day but no other medications. Lungs are clear and physical exam. No evidence of wheezing or rales. Consider URI versus bronchitis. Also consider pneumonia,  the low suspicion given history/physical exam. History/physical exam are not concerning for a PE. Given that patient's had a productive cough for one week, will obtain a chest x-ray to evaluate for any acute infectious etiology.  Chest x-ray reviewed. Negative for any acute infectious etiology. Symptoms likely result of a URI. Discussed results with patient. Patient has a history of anxiety and states that she constantly, worries about having a heart attack." Patient wanted reassurance that her heart sounds normal. She states that she was recently seen on 06/26/16 for chest pain and wanted to be sure that everything looked okay. We discussed at length regarding patient's chest pain. She reports that it is more of an ache and occurs mostly when she coughs. She reports that she has had some intermittent chest pain associated with her anxiety, and knows that it is related to her panic attacks as it  occurs when she gets stressed or nervous about certain situations. She states that she is not having any current chest pain. We discussed smoking cessation to help limit potential cardiac risk factors. At this time patient is stable for discharge. We'll provide some nasal spray for relief of nasal congestion. Instructed patient to to use cough syrup for symptomatically. Will also provide patient with a list of resources for psychiatric evaluation and counseling to help with issues of anxiety. Strict return precautions discussed. Patient expresses understanding and agreement to plan.  Final Clinical Impressions(s) / ED Diagnoses   Final diagnoses:  Upper respiratory tract infection, unspecified type    New Prescriptions Discharge Medication List as of 07/05/2016 12:20 AM    START taking these medications   Details  fluticasone (FLONASE) 50 MCG/ACT nasal spray Place 1 spray into both nostrils daily., Starting Mon 07/05/2016, Print      I personally performed the services described in this documentation, which was scribed in my presence. The recorded information has been reviewed and is accurate.     Volanda Napoleon, PA-C 07/05/16 Cristal Deer, MD 07/07/16 [phone]

## 2016-07-05 MED ORDER — FLUTICASONE PROPIONATE 50 MCG/ACT NA SUSP
1.0000 | Freq: Every day | NASAL | 2 refills | Status: DC
Start: 2016-07-05 — End: 2016-08-27

## 2016-07-05 NOTE — Discharge Instructions (Signed)
As we discussed she can take over-the-counter Delsym for the cough.  You can use the Flonase to help with the nasal congestion.  You can use one of the doctors listed below to establish a primary care doctor.   You can also use one of the resources listed below to to help with anxiety.    If you do not have a primary care doctor you see regularly, please you the list below. Please call them to arrange for follow-up.    No Primary Care Doctor Call [facility]  [phone] Other agencies that provide inexpensive medical care    [place]  [phone]    [facility] Internal [facility]  [place]  [phone]    [facility]  [phone]    Planned Parenthood  [facility]  [phone]       [place] in the [facility]  Intensive Outpatient Programs: [facility]      [place]. [place], [place] Both a day and evening program       [facility] Outpatient     [address]        [place], [place] [address] [phone]         ADS: Alcohol & Drug Svcs [place]: [phone] or [phone] [address], [place] [address] [email or web address]   Substance Abuse Resources: - Alcohol and Drug Services  [phone] - [facility] [phone] - [facility] [place] [phone] - Residential & Outpatient Substance Abuse Program  [phone]  Psychological Services: - [address]  [place]  [place], [phone] [place]. [address], [place], [place]: [phone] or [phone], [email or web address]  Mobile Crisis Teams:                                             [facility]         Mobile Crisis Care Unit [phone]             [facility] [address] Lady Gary Amargosa [address] [place] [phone]  Self-Help/Support Groups: [place]. of [employer] of support groups [phone] (call for more info)  Narcotics Anonymous (NA) Caring Services [address] [place] - 2 meetings at this location  Residential Treatment Programs:  [place]       [place] [address], [place], [place]  [address] [place]  [address] [place], [place] [address] [phone] Admissions: 8am-3pm M-F  Incentives Substance [place]     [address], [place] [address]  [phone]         [facility] [address] [place], [place]  [facility] [address] [place], [place]  [place] Programs - Intensive Outpatient      [address] [place] [address]     [place], [place]         [facility]     [place], [place] or [phone]  Residential Treatment Services (RTS), Medicaid [place], [place]  [facility] [address]                                               [address] [place]  [facility] Resources: [phone]               General Therapy                                                Domenic Schwab, PhD        [address] [place], [place] [address]         [place]   [address] [place], [place] [address] [phone]  [facility] Recovery [address] [place], [place] [address] [phone] Insurance/Medicaid/sponsorship through [facility] and Families                                              [address] [place]                                        [place], [place] [address]    Therapy/tele-psych/case         [place] [address],   [address]  Adolescent/group home/case management [phone]                                           Rosette Reveal PhD       General therapy       Insurance   [phone]         Dr. Adele Schilder, [place], M-F [phone]  [facility] [place] [facility] Dept. [address]         [place]  Sela Hua Phone:  [phone]                                  Phone:  [phone]                   Phone:  [place], [place] in [place], [address],             [phone], Alene Mires

## 2016-07-23 ENCOUNTER — Encounter (HOSPITAL_COMMUNITY): Payer: Self-pay | Admitting: Emergency Medicine

## 2016-07-23 ENCOUNTER — Emergency Department (HOSPITAL_COMMUNITY): Payer: Self-pay

## 2016-07-23 DIAGNOSIS — R072 Precordial pain: Secondary | ICD-10-CM | POA: Insufficient documentation

## 2016-07-23 DIAGNOSIS — R0602 Shortness of breath: Secondary | ICD-10-CM | POA: Insufficient documentation

## 2016-07-23 DIAGNOSIS — Z79899 Other long term (current) drug therapy: Secondary | ICD-10-CM | POA: Insufficient documentation

## 2016-07-23 DIAGNOSIS — N898 Other specified noninflammatory disorders of vagina: Secondary | ICD-10-CM | POA: Insufficient documentation

## 2016-07-23 DIAGNOSIS — F17210 Nicotine dependence, cigarettes, uncomplicated: Secondary | ICD-10-CM | POA: Insufficient documentation

## 2016-07-23 LAB — CBC
HEMATOCRIT: 43.3 % (ref 36.0–46.0)
Hemoglobin: 14.8 g/dL (ref 12.0–15.0)
MCH: 29.9 pg (ref 26.0–34.0)
MCHC: 34.2 g/dL (ref 30.0–36.0)
MCV: 87.5 fL (ref 78.0–100.0)
Platelets: 342 10*3/uL (ref 150–400)
RBC: 4.95 MIL/uL (ref 3.87–5.11)
RDW: 12.6 % (ref 11.5–15.5)
WBC: 16.6 10*3/uL — AB (ref 4.0–10.5)

## 2016-07-23 LAB — BASIC METABOLIC PANEL
Anion gap: 9 (ref 5–15)
BUN: 9 mg/dL (ref 6–20)
CHLORIDE: 106 mmol/L (ref 101–111)
CO2: 22 mmol/L (ref 22–32)
Calcium: 9.6 mg/dL (ref 8.9–10.3)
Creatinine, Ser: 1.05 mg/dL — ABNORMAL HIGH (ref 0.44–1.00)
GFR calc Af Amer: >60 mL/min (ref >60)
GFR calc non Af Amer: >60 mL/min (ref >60)
GLUCOSE: 102 mg/dL — AB (ref 65–99)
POTASSIUM: 3.7 mmol/L (ref 3.5–5.1)
Sodium: 137 mmol/L (ref 135–145)

## 2016-07-23 LAB — I-STAT TROPONIN, ED: Troponin i, poc: 0 ng/mL (ref 0.00–0.08)

## 2016-07-23 LAB — URINALYSIS, ROUTINE W REFLEX MICROSCOPIC
GLUCOSE, UA: NEGATIVE mg/dL
Hgb urine dipstick: NEGATIVE
KETONES UR: NEGATIVE mg/dL
Nitrite: NEGATIVE
PROTEIN: 30 mg/dL — AB
Specific Gravity, Urine: 1.035 — ABNORMAL HIGH (ref 1.005–1.030)
pH: 5 (ref 5.0–8.0)

## 2016-07-23 NOTE — ED Notes (Signed)
Pt came to [facility] 1st to ask about wait time. Pt asked if it would be quicker to go home and call 911 to speed up the process. Told pt there was a possibility that EMS would bring her back out to triage and she would then have to start all over. Pt stated she would stay and wait

## 2016-07-23 NOTE — ED Triage Notes (Signed)
Pt reports that she needs to be checked for her chest pain.  Its the the "same as usual".  Pt also reports "a possible UTI" b/c she has green discharge.  Several complaints at this time w/ she admits could be from "my anxiety".

## 2016-07-24 ENCOUNTER — Emergency Department (HOSPITAL_COMMUNITY)
Admission: EM | Admit: 2016-07-24 | Discharge: 2016-07-24 | Disposition: A | Discharge status: Home / Self Care | Payer: Self-pay | Attending: Emergency Medicine | Admitting: Emergency Medicine

## 2016-07-24 DIAGNOSIS — N898 Other specified noninflammatory disorders of vagina: Secondary | ICD-10-CM

## 2016-07-24 DIAGNOSIS — R072 Precordial pain: Secondary | ICD-10-CM

## 2016-07-24 LAB — PREGNANCY, URINE: Preg Test, Ur: NEGATIVE

## 2016-07-24 NOTE — ED Notes (Signed)
Pt comes in for chest pain, bruised ribs, and vaginal discharge. Pt reports the chest pain and bruised ribs has been going on for several weeks and the vaginal discharge has been going on for 3 days.

## 2016-07-24 NOTE — Discharge Instructions (Signed)

## 2016-07-24 NOTE — ED Provider Notes (Signed)
[facility] DEPT Provider Note   CSN: [id number] Arrival date & time: 07/23/16  2050  By signing my name below, I, Jaquelyn Bitter., attest that this documentation has been prepared under the direction and in the presence of No att. providers found. Electronically signed: Jaquelyn Bitter., ED Scribe. 07/24/16. 1:40 AM.    History   Chief Complaint Chief Complaint  Patient presents with  . Chest Pain  . Vaginal Discharge    HPI Beverly Torres is a 21 y.o. female with hx of anxiety, depression, multiple personality disorder who presents to the Emergency Department complaining of constant, unchanged, mild chest pain with onset x3 weeks. Pt states that she has had constant chest pain with palpation for the past x3 weeks. She denies pain with breathing.  She has pain with coughing.  Pt reports SOB, cough and also vaginal discharge (green.) She denies any modifying factors. Pt denies fever, vomiting, hematemesis, abdominal pain, leg swelling, vaginal bleeding. Of note, pt is a smoker. Pt takes Hydroxyzine and Ranitidine daily.  No hemoptysis No OCP or birth control products  The history is provided by the patient. No language interpreter was used.    Past Medical History:  Diagnosis Date  . Acid reflux   . Anxiety   . Anxiety   . Attention deficit disorder     Patient Active Problem List   Diagnosis Date Noted  . Anxiety 05/02/2012  . ADHD (attention deficit hyperactivity disorder), combined type 03/28/2012    Past Surgical History:  Procedure Laterality Date  . ADENOIDECTOMY    . TONSILLECTOMY    . WISDOM TOOTH EXTRACTION      OB History    No data available       Home Medications    Prior to Admission medications   Medication Sig Start Date End Date Taking? Authorizing Provider  cetirizine (ZYRTEC) 10 MG tablet Take 1 tablet (10 mg total) by mouth daily. Patient not taking: Reported on 02/20/2016 06/12/15   Paretta-Leahey, Haze Boyden, NP    fluticasone (FLONASE) 50 MCG/ACT nasal spray Place 1 spray into both nostrils daily. 07/05/16   Volanda Napoleon, PA-C  hydrOXYzine (ATARAX/VISTARIL) 50 MG tablet Take 1 tablet (50 mg total) by mouth 3 (three) times daily as needed for anxiety. 06/12/15   Paretta-Leahey, Haze Boyden, NP  ranitidine (ZANTAC) 150 MG tablet Take 150 mg by mouth 2 (two) times daily.    [provider]    Family History Family History  Problem Relation Age of Onset  . Anxiety disorder Mother   . Drug abuse Father   . ADD / ADHD Father   . ADD / ADHD Sister   . Anxiety disorder Sister   . ADD / ADHD Brother   . ODD Brother   . Rheum arthritis Maternal Grandmother   . Kidney failure Maternal Grandmother   . Hypertension Maternal Grandfather   . Diabetes Maternal Grandfather   . Arthritis Maternal Grandfather   . Cancer Maternal Grandfather        Melanoma  . Diabetes Paternal Grandfather     Social History Social History  Substance Use Topics  . Smoking status: Current Every Day Smoker    Types: Cigarettes  . Smokeless tobacco: Never Used  . Alcohol use No     Allergies   Patient has no known allergies.   Review of Systems Review of Systems  All other systems reviewed and are negative.    Physical Exam Updated Vital Signs  BP 125/72   Pulse (!) 107   Temp 98 F (36.7 C) (Oral)   Resp 16   LMP 07/04/2016   SpO2 100%   Physical Exam  CONSTITUTIONAL: Well developed/well nourished HEAD: Normocephalic/atraumatic EYES: EOMI ENMT: Mucous membranes moist NECK: supple no meningeal signs SPINE/BACK:entire spine nontender CV: S1/S2 noted, no murmurs/rubs/gallops noted LUNGS: Lungs are clear to auscultation bilaterally, no apparent distress CHEST:: Diffuse chest wall tenderness noted.  No herpetic rash.  No bruising.  No breast mass/lump.  No axillary tenderness/lump.  Tech Charlett Nose present for entire exam ABDOMEN: soft, nontender, no rebound or guarding, bowel sounds noted throughout  abdomen GU:no cva tenderness, patient declines pelvic exam NEURO: Pt is awake/alert/appropriate, moves all extremitiesx4.  No facial droop.   EXTREMITIES: pulses normal/equal, full ROM, no LE edema SKIN: warm, color normal PSYCH: anxious  ED Treatments / Results   DIAGNOSTIC STUDIES: Oxygen Saturation is 100% on RA, normal by my interpretation.   COORDINATION OF CARE: 1:41 AM-Discussed next steps with pt. Pt verbalized understanding and is agreeable with the plan.    Labs (all labs ordered are listed, but only abnormal results are displayed) Labs Reviewed  BASIC METABOLIC PANEL - Abnormal; Notable for the following:       Result Value   Glucose, Bld 102 (*)    Creatinine, Ser 1.05 (*)    All other components within normal limits  CBC - Abnormal; Notable for the following:    WBC 16.6 (*)    All other components within normal limits  URINALYSIS, ROUTINE W REFLEX MICROSCOPIC - Abnormal; Notable for the following:    APPearance HAZY (*)    Specific Gravity, Urine 1.035 (*)    Bilirubin Urine SMALL (*)    Protein, ur 30 (*)    Leukocytes, UA SMALL (*)    Bacteria, UA MANY (*)    Squamous Epithelial / LPF 6-30 (*)    All other components within normal limits  PREGNANCY, URINE  I-STAT TROPOININ, ED  POC URINE PREG, ED    EKG  EKG Interpretation  Date/Time:  Friday July 23 2016 21:02:51 EDT Ventricular Rate:  110 PR Interval:  112 QRS Duration: 84 QT Interval:  336 QTC Calculation: 454 R Axis:   105 Text Interpretation:  Sinus tachycardia Rightward axis Borderline ECG No significant change since last tracing Confirmed by Ripley Fraise [phone]) on 07/24/2016 2:24:18 AM       Radiology Dg Chest 2 View  Result Date: 07/23/2016 CLINICAL DATA:  Left-sided chest pain and cough x1 week. EXAM: CHEST  2 VIEW COMPARISON:  None. FINDINGS: The heart size and mediastinal contours are within normal limits. Both lungs are clear. The visualized skeletal structures are  unremarkable. IMPRESSION: No active cardiopulmonary disease. Electronically Signed   By: Ashley Royalty M.D.   On: 07/23/2016 23:12    Procedures Procedures (including critical care time)  Medications Ordered in ED Medications - No data to display   Initial Impression / Assessment and Plan / ED Course  I have reviewed the triage vital signs and the nursing notes.  Pertinent labs & imaging results that were available during my care of the patient were reviewed by me and considered in my medical decision making (see chart for details).     Pt well appearing CP is reproducible, reports recent coughing, likely chest wall strain No hypoxia I doubt PE as cause of pain, suspect tachycardia due to anxiety Pt refuses pelvic exam, advised health dept followup  Advised f/u with PCP  or GYN for full breast exam but on my chaperoned exam no signs of breast mass  Final Clinical Impressions(s) / ED Diagnoses   Final diagnoses:  Precordial pain  Vaginal discharge    New Prescriptions New Prescriptions   No medications on file   I personally performed the services described in this documentation, which was scribed in my presence. The recorded information has been reviewed and is accurate.       Ripley Fraise, MD 07/24/16 [phone]

## 2016-08-01 ENCOUNTER — Emergency Department (HOSPITAL_COMMUNITY)
Admission: EM | Admit: 2016-08-01 | Discharge: 2016-08-01 | Disposition: A | Discharge status: Home / Self Care | Payer: Medicaid Other | Attending: Emergency Medicine | Admitting: Emergency Medicine

## 2016-08-01 ENCOUNTER — Encounter (HOSPITAL_COMMUNITY): Payer: Self-pay | Admitting: Emergency Medicine

## 2016-08-01 DIAGNOSIS — Z79899 Other long term (current) drug therapy: Secondary | ICD-10-CM | POA: Insufficient documentation

## 2016-08-01 DIAGNOSIS — F419 Anxiety disorder, unspecified: Secondary | ICD-10-CM | POA: Insufficient documentation

## 2016-08-01 DIAGNOSIS — F17210 Nicotine dependence, cigarettes, uncomplicated: Secondary | ICD-10-CM | POA: Insufficient documentation

## 2016-08-01 DIAGNOSIS — F411 Generalized anxiety disorder: Secondary | ICD-10-CM

## 2016-08-01 DIAGNOSIS — F902 Attention-deficit hyperactivity disorder, combined type: Secondary | ICD-10-CM | POA: Insufficient documentation

## 2016-08-01 MED ORDER — LORAZEPAM 1 MG PO TABS: 1.0000 mg | ORAL_TABLET | Freq: Three times a day (TID) | ORAL | 0 refills | Status: DC | PRN

## 2016-08-01 MED ORDER — GI COCKTAIL ~~LOC~~
30.0000 mL | Freq: Once | ORAL | Status: AC
  Administered 2016-08-01: 30 mL via ORAL
  Filled 2016-08-01: qty 30

## 2016-08-01 MED ORDER — LORAZEPAM 1 MG PO TABS
1.0000 mg | ORAL_TABLET | Freq: Once | ORAL | Status: AC
  Administered 2016-08-01: 1 mg via ORAL
  Filled 2016-08-01: qty 1
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
## 2016-08-01 NOTE — ED Notes (Signed)
Bed: WTR5 Expected date:  Expected time:  Means of arrival:  Comments: 
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
## 2016-08-03 ENCOUNTER — Encounter (HOSPITAL_COMMUNITY): Payer: Self-pay | Admitting: Family Medicine

## 2016-08-03 ENCOUNTER — Emergency Department (HOSPITAL_COMMUNITY)
Admission: EM | Admit: 2016-08-03 | Discharge: 2016-08-03 | Disposition: A | Discharge status: Home / Self Care | Payer: Medicaid Other | Attending: Emergency Medicine | Admitting: Emergency Medicine

## 2016-08-03 ENCOUNTER — Emergency Department (HOSPITAL_COMMUNITY): Payer: Medicaid Other

## 2016-08-03 DIAGNOSIS — F419 Anxiety disorder, unspecified: Secondary | ICD-10-CM | POA: Insufficient documentation

## 2016-08-03 DIAGNOSIS — F909 Attention-deficit hyperactivity disorder, unspecified type: Secondary | ICD-10-CM | POA: Insufficient documentation

## 2016-08-03 DIAGNOSIS — F17210 Nicotine dependence, cigarettes, uncomplicated: Secondary | ICD-10-CM | POA: Insufficient documentation

## 2016-08-03 DIAGNOSIS — Z79899 Other long term (current) drug therapy: Secondary | ICD-10-CM | POA: Insufficient documentation

## 2016-08-03 LAB — ACETAMINOPHEN LEVEL

## 2016-08-03 LAB — I-STAT TROPONIN, ED: TROPONIN I, POC: 0 ng/mL (ref 0.00–0.08)

## 2016-08-03 LAB — BASIC METABOLIC PANEL
ANION GAP: 12 (ref 5–15)
BUN: 11 mg/dL (ref 6–20)
CHLORIDE: 104 mmol/L (ref 101–111)
CO2: 22 mmol/L (ref 22–32)
Calcium: 9.8 mg/dL (ref 8.9–10.3)
Creatinine, Ser: 0.94 mg/dL (ref 0.44–1.00)
GFR calc non Af Amer: >60 mL/min (ref >60)
GLUCOSE: 109 mg/dL — AB (ref 65–99)
POTASSIUM: 3.6 mmol/L (ref 3.5–5.1)
Sodium: 138 mmol/L (ref 135–145)

## 2016-08-03 LAB — RAPID URINE DRUG SCREEN, HOSP PERFORMED
AMPHETAMINES: NOT DETECTED
BENZODIAZEPINES: POSITIVE — AB
Barbiturates: POSITIVE — AB
Cocaine: NOT DETECTED
Opiates: NOT DETECTED
TETRAHYDROCANNABINOL: NOT DETECTED

## 2016-08-03 LAB — CBC
HCT: 44.5 % (ref 36.0–46.0)
HEMOGLOBIN: 15.7 g/dL — AB (ref 12.0–15.0)
MCH: 30.8 pg (ref 26.0–34.0)
MCHC: 35.3 g/dL (ref 30.0–36.0)
MCV: 87.3 fL (ref 78.0–100.0)
PLATELETS: 343 10*3/uL (ref 150–400)
RBC: 5.1 MIL/uL (ref 3.87–5.11)
RDW: 12.4 % (ref 11.5–15.5)
WBC: 13.2 10*3/uL — ABNORMAL HIGH (ref 4.0–10.5)

## 2016-08-03 LAB — SALICYLATE LEVEL: Salicylate Lvl: <7 mg/dL (ref 2.8–30.0)

## 2016-08-03 LAB — POC URINE PREG, ED: PREG TEST UR: NEGATIVE

## 2016-08-03 LAB — ETHANOL: Alcohol, Ethyl (B): <5 mg/dL (ref <5)

## 2016-08-03 MED ORDER — BUSPIRONE HCL 7.5 MG PO TABS
7.5000 mg | ORAL_TABLET | Freq: Two times a day (BID) | ORAL | 0 refills | Status: DC
Start: 2016-08-03 — End: 2016-08-27

## 2016-08-03 MED ORDER — GI COCKTAIL ~~LOC~~
30.0000 mL | Freq: Once | ORAL | Status: AC
  Administered 2016-08-03: 30 mL via ORAL
  Filled 2016-08-03: qty 30

## 2016-08-03 NOTE — Discharge Instructions (Signed)
Take the medications for anxiety, follow up with the mental health provider as planned

## 2016-08-03 NOTE — BH Assessment (Signed)
Clinician completed TTS assessment with patient and mother present.  Clinician consulted with Patriciaann Clan who recommends patient to be discharged home to follow up with outpatient.  Clinician consulted with Dr. Tomi Bamberger who reported he would prescribe Buspar for patient and DC home.   Rigoberto Noel, MSW, LCSW Triage Specialist [phone],

## 2016-08-03 NOTE — ED Notes (Signed)
Bed: WLPT1 Expected date:  Expected time:  Means of arrival:  Comments: 

## 2016-08-03 NOTE — ED Notes (Signed)
Pt is alert and oriented x 4 and is verbally responsive. Pt appears anxious and is pacing around room and in hall intermittently. Pt states " can you check my heart" Pt appears to be without apparent distress.
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
## 2016-08-03 NOTE — ED Notes (Signed)
Pt called in lobby, not present in lobby.
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
## 2016-08-14 ENCOUNTER — Emergency Department (HOSPITAL_COMMUNITY)
Admission: EM | Admit: 2016-08-14 | Discharge: 2016-08-14 | Disposition: A | Discharge status: Home / Self Care | Payer: Medicaid Other | Attending: Emergency Medicine | Admitting: Emergency Medicine

## 2016-08-14 ENCOUNTER — Emergency Department (HOSPITAL_COMMUNITY): Payer: Medicaid Other

## 2016-08-14 ENCOUNTER — Encounter (HOSPITAL_COMMUNITY): Payer: Self-pay

## 2016-08-14 DIAGNOSIS — F419 Anxiety disorder, unspecified: Secondary | ICD-10-CM

## 2016-08-14 DIAGNOSIS — F902 Attention-deficit hyperactivity disorder, combined type: Secondary | ICD-10-CM | POA: Insufficient documentation

## 2016-08-14 DIAGNOSIS — K2100 Gastro-esophageal reflux disease with esophagitis, without bleeding: Secondary | ICD-10-CM

## 2016-08-14 DIAGNOSIS — F17210 Nicotine dependence, cigarettes, uncomplicated: Secondary | ICD-10-CM | POA: Insufficient documentation

## 2016-08-14 DIAGNOSIS — Z79899 Other long term (current) drug therapy: Secondary | ICD-10-CM | POA: Insufficient documentation

## 2016-08-14 DIAGNOSIS — R079 Chest pain, unspecified: Secondary | ICD-10-CM

## 2016-08-14 LAB — CBC
HEMATOCRIT: 47 % — AB (ref 36.0–46.0)
HEMOGLOBIN: 15.8 g/dL — AB (ref 12.0–15.0)
MCH: 29.7 pg (ref 26.0–34.0)
MCHC: 33.6 g/dL (ref 30.0–36.0)
MCV: 88.3 fL (ref 78.0–100.0)
Platelets: 317 10*3/uL (ref 150–400)
RBC: 5.32 MIL/uL — ABNORMAL HIGH (ref 3.87–5.11)
RDW: 12.6 % (ref 11.5–15.5)
WBC: 9.9 10*3/uL (ref 4.0–10.5)

## 2016-08-14 LAB — BASIC METABOLIC PANEL
ANION GAP: 10 (ref 5–15)
BUN: 8 mg/dL (ref 6–20)
CALCIUM: 9.9 mg/dL (ref 8.9–10.3)
CO2: 23 mmol/L (ref 22–32)
Chloride: 106 mmol/L (ref 101–111)
Creatinine, Ser: 0.97 mg/dL (ref 0.44–1.00)
Glucose, Bld: 117 mg/dL — ABNORMAL HIGH (ref 65–99)
POTASSIUM: 4 mmol/L (ref 3.5–5.1)
SODIUM: 139 mmol/L (ref 135–145)

## 2016-08-14 LAB — I-STAT TROPONIN, ED: TROPONIN I, POC: 0.01 ng/mL (ref 0.00–0.08)

## 2016-08-14 MED ORDER — PANTOPRAZOLE SODIUM 40 MG PO TBEC
40.0000 mg | DELAYED_RELEASE_TABLET | Freq: Every day | ORAL | Status: DC
  Administered 2016-08-14: 40 mg via ORAL
  Filled 2016-08-14: qty 1

## 2016-08-14 MED ORDER — LORAZEPAM 1 MG PO TABS
2.0000 mg | ORAL_TABLET | Freq: Once | ORAL | Status: AC
  Administered 2016-08-14: 2 mg via ORAL
  Filled 2016-08-14: qty 2

## 2016-08-14 MED ORDER — OMEPRAZOLE 20 MG PO CPDR: 20.0000 mg | DELAYED_RELEASE_CAPSULE | Freq: Two times a day (BID) | ORAL | 0 refills | Status: DC

## 2016-08-14 MED ORDER — GI COCKTAIL ~~LOC~~
30.0000 mL | Freq: Once | ORAL | Status: AC
  Administered 2016-08-14: 30 mL via ORAL
  Filled 2016-08-14: qty 30

## 2016-08-14 MED ORDER — LORAZEPAM 2 MG/ML IJ SOLN: 1.0000 mg | INTRAMUSCULAR | Status: DC | PRN

## 2016-08-14 NOTE — ED Notes (Signed)
[facility] at [facility] asking to have bp checked.

## 2016-08-14 NOTE — ED Provider Notes (Signed)
[facility] DEPT Provider Note   CSN: [id number] Arrival date & time: 08/14/16  1452     History   Chief Complaint Chief Complaint  Patient presents with  . Chest Pain    HPI Beverly Torres is a 21 y.o. female. Chief complaint is chest pain and chest burning  HPI 21 year old female. Multiple ER visits over the last several months. Describes anxiety that "Atarax doesn't help". Describes episodes of palpitations and burning in her chest.  Chart on Desyrel yesterday and states "it hasn't helped yet". Denies alcohol. She smokes daily. Denies cocaine and amphetamine use.  Past Medical History:  Diagnosis Date  . Acid reflux   . Anxiety   . Anxiety   . Attention deficit disorder     Patient Active Problem List   Diagnosis Date Noted  . Anxiety 05/02/2012  . ADHD (attention deficit hyperactivity disorder), combined type 03/28/2012    Past Surgical History:  Procedure Laterality Date  . ADENOIDECTOMY    . TONSILLECTOMY    . WISDOM TOOTH EXTRACTION      OB History    No data available       Home Medications    Prior to Admission medications   Medication Sig Start Date End Date Taking? Authorizing Provider  busPIRone (BUSPAR) 7.5 MG tablet Take 1 tablet (7.5 mg total) by mouth 2 (two) times daily. 08/03/16   Dorie Rank, MD  cetirizine (ZYRTEC) 10 MG tablet Take 1 tablet (10 mg total) by mouth daily. Patient not taking: Reported on 02/20/2016 06/12/15   Paretta-Leahey, Haze Boyden, NP  fluticasone (FLONASE) 50 MCG/ACT nasal spray Place 1 spray into both nostrils daily. Patient not taking: Reported on 08/03/2016 07/05/16   [name] [name] A, PA-C  hydrOXYzine (ATARAX/VISTARIL) 50 MG tablet Take 1 tablet (50 mg total) by mouth 3 (three) times daily as needed for anxiety. Patient not taking: Reported on 08/03/2016 06/12/15   Paretta-Leahey, Haze Boyden, NP  LORazepam (ATIVAN) 1 MG tablet Take 1 tablet (1 mg total) by mouth every 8 (eight) hours as needed for anxiety. 08/01/16   Antonietta Breach, PA-C  omeprazole (PRILOSEC) 20 MG capsule Take 1 capsule (20 mg total) by mouth 2 (two) times daily. 08/14/16   Tanna Furry, MD  ranitidine (ZANTAC) 150 MG tablet Take 150 mg by mouth 2 (two) times daily.    [provider]    Family History Family History  Problem Relation Age of Onset  . Anxiety disorder Mother   . Drug abuse Father   . ADD / ADHD Father   . ADD / ADHD Sister   . Anxiety disorder Sister   . ADD / ADHD Brother   . ODD Brother   . Rheum arthritis Maternal Grandmother   . Kidney failure Maternal Grandmother   . Hypertension Maternal Grandfather   . Diabetes Maternal Grandfather   . Arthritis Maternal Grandfather   . Cancer Maternal Grandfather        Melanoma  . Diabetes Paternal Grandfather     Social History Social History  Substance Use Topics  . Smoking status: Current Every Day Smoker    Types: Cigarettes  . Smokeless tobacco: Never Used  . Alcohol use No     Allergies   Patient has no known allergies.   Review of Systems Review of Systems  Constitutional: Negative for appetite change, chills, diaphoresis, fatigue and fever.  HENT: Negative for mouth sores, sore throat and trouble swallowing.   Eyes: Negative for visual disturbance.  Respiratory:  Negative for cough, chest tightness, shortness of breath and wheezing.   Cardiovascular: Positive for chest pain.  Gastrointestinal: Negative for abdominal distention, abdominal pain, diarrhea, nausea and vomiting.  Endocrine: Negative for polydipsia, polyphagia and polyuria.  Genitourinary: Negative for dysuria, frequency and hematuria.  Musculoskeletal: Negative for gait problem.  Skin: Negative for color change, pallor and rash.  Neurological: Negative for dizziness, syncope, light-headedness and headaches.  Hematological: Does not bruise/bleed easily.  Psychiatric/Behavioral: Negative for behavioral problems and confusion. The patient is nervous/anxious and is hyperactive.       Physical Exam Updated Vital Signs BP 123/66 (BP Location: Right Arm)   Pulse 69   Temp 98.5 F (36.9 C) (Oral)   Resp 16   LMP 08/03/2016   SpO2 95%   Physical Exam  Constitutional: She is oriented to person, place, and time. She appears well-developed and well-nourished. No distress.  HENT:  Head: Normocephalic.  Eyes: Conjunctivae are normal. Pupils are equal, round, and reactive to light. No scleral icterus.  Neck: Normal range of motion. Neck supple. No thyromegaly present.  Cardiovascular: Normal rate and regular rhythm.  Exam reveals no gallop and no friction rub.   No murmur heard. Pulmonary/Chest: Effort normal and breath sounds normal. No respiratory distress. She has no wheezes. She has no rales.  Abdominal: Soft. Bowel sounds are normal. She exhibits no distension. There is no tenderness. There is no rebound.  Musculoskeletal: Normal range of motion.  Neurological: She is alert and oriented to person, place, and time.  Skin: Skin is warm and dry. No rash noted.  Psychiatric:  Anxious. Hyperactive. Not delusional. Oriented lucid.     ED Treatments / Results  Labs (all labs ordered are listed, but only abnormal results are displayed) Labs Reviewed  BASIC METABOLIC PANEL - Abnormal; Notable for the following:       Result Value   Glucose, Bld 117 (*)    All other components within normal limits  CBC - Abnormal; Notable for the following:    RBC 5.32 (*)    Hemoglobin 15.8 (*)    HCT 47.0 (*)    All other components within normal limits  I-STAT TROPOININ, ED    EKG  EKG Interpretation  Date/Time:  Saturday August 14 2016 14:58:09 EDT Ventricular Rate:  92 PR Interval:  108 QRS Duration: 86 QT Interval:  376 QTC Calculation: 464 R Axis:   110 Text Interpretation:  Sinus rhythm with sinus arrhythmia with short PR Right axis deviation Abnormal ECG Confirmed by Tanna Furry [phone]) on 08/14/2016 4:48:40 PM       Radiology Dg Chest 2 View  Result  Date: 08/14/2016 CLINICAL DATA:  21 y/o  F; chest pain. EXAM: CHEST  2 VIEW COMPARISON:  08/03/2016 chest radiograph FINDINGS: Stable heart size and mediastinal contours are within normal limits. Both lungs are clear. The visualized skeletal structures are unremarkable. IMPRESSION: No active cardiopulmonary disease. Electronically Signed   By: Kristine Garbe M.D.   On: 08/14/2016 15:45    Procedures Procedures (including critical care time)  Medications Ordered in ED Medications  LORazepam (ATIVAN) injection 1 mg (not administered)  gi cocktail (Maalox,Lidocaine,Donnatal) (not administered)  pantoprazole (PROTONIX) EC tablet 40 mg (not administered)     Initial Impression / Assessment and Plan / ED Course  I have reviewed the triage vital signs and the nursing notes.  Pertinent labs & imaging results that were available during my care of the patient were reviewed by me and considered in my medical  decision making (see chart for details).    EKG normal. Hemoglobin 15. Basic metabolic profile normal glucose and 17 normal troponin. Given GI cocktail by mouth topics. Given 1 mg Ativan. Discharge home. Continue Desyrel. Continue hydroxyzine. Pepcid twice a day. Reflux precautions.  Final Clinical Impressions(s) / ED Diagnoses   Final diagnoses:  Chest pain, unspecified type  Anxiety  Gastroesophageal reflux disease with esophagitis    New Prescriptions New Prescriptions   OMEPRAZOLE (PRILOSEC) 20 MG CAPSULE    Take 1 capsule (20 mg total) by mouth 2 (two) times daily.     Tanna Furry, MD 08/14/16 [phone]

## 2016-08-14 NOTE — Discharge Instructions (Addendum)
Continue Buspar--it takes 3-4 weeks to start to help anxiety Take prilosec am and pm for reflux Continue Hydroxyzine for anxiety. STOP smoking

## 2016-08-14 NOTE — ED Triage Notes (Signed)
Pt reports left side chest pain that started months ago. She reports she has been seen here multiple times in the past for chest pain yielding a negative work up.  She googled "if dreams come true" because she dreampt she had a heart attack. She states she read that sometimes dreams can come true so she fears she is having a heart attack.

## 2016-08-14 NOTE — ED Notes (Signed)
[facility] to have bp checked.

## 2016-08-14 NOTE — ED Notes (Signed)
Pt requested this RN to listen to her lungs and heart. She continuously is asking if she was having a heart attack and asking if her vital signs looks good. Pt reassured.

## 2016-08-14 NOTE — ED Notes (Signed)
Pt constantly coming to desk asking about status; informed pt that labs are WNL and will get a room as soon as can for eval from EDP

## 2016-08-16 ENCOUNTER — Encounter (HOSPITAL_COMMUNITY): Payer: Self-pay | Admitting: Emergency Medicine

## 2016-08-16 ENCOUNTER — Emergency Department (HOSPITAL_COMMUNITY)
Admission: EM | Admit: 2016-08-16 | Discharge: 2016-08-17 | Disposition: A | Discharge status: Home / Self Care | Payer: Medicaid Other | Attending: Emergency Medicine | Admitting: Emergency Medicine

## 2016-08-16 DIAGNOSIS — Z5321 Procedure and treatment not carried out due to patient leaving prior to being seen by health care provider: Secondary | ICD-10-CM | POA: Insufficient documentation

## 2016-08-16 DIAGNOSIS — Z046 Encounter for general psychiatric examination, requested by authority: Secondary | ICD-10-CM | POA: Insufficient documentation

## 2016-08-16 NOTE — ED Triage Notes (Signed)
Per [facility] patient from home for medical clearance for her anxiety, schizo who currently not on any medications for them. Patient restless per EMS. patient denies SI or HI.

## 2016-08-16 NOTE — ED Notes (Signed)
Called pt 2x and there is no response in the lobby

## 2016-08-17 ENCOUNTER — Encounter (HOSPITAL_COMMUNITY): Payer: Self-pay | Admitting: Emergency Medicine

## 2016-08-17 ENCOUNTER — Emergency Department (HOSPITAL_COMMUNITY)
Admission: EM | Admit: 2016-08-17 | Discharge: 2016-08-18 | Disposition: A | Discharge status: Home / Self Care | Payer: Medicaid Other | Attending: Emergency Medicine | Admitting: Emergency Medicine

## 2016-08-17 ENCOUNTER — Encounter (INDEPENDENT_AMBULATORY_CARE_PROVIDER_SITE_OTHER): Payer: Self-pay | Admitting: Physician Assistant

## 2016-08-17 ENCOUNTER — Emergency Department (HOSPITAL_COMMUNITY)
Admission: EM | Admit: 2016-08-17 | Discharge: 2016-08-17 | Disposition: A | Discharge status: Home / Self Care | Payer: Medicaid Other | Attending: Emergency Medicine | Admitting: Emergency Medicine

## 2016-08-17 DIAGNOSIS — R079 Chest pain, unspecified: Secondary | ICD-10-CM | POA: Insufficient documentation

## 2016-08-17 DIAGNOSIS — F410 Panic disorder [episodic paroxysmal anxiety] without agoraphobia: Secondary | ICD-10-CM | POA: Insufficient documentation

## 2016-08-17 DIAGNOSIS — F419 Anxiety disorder, unspecified: Secondary | ICD-10-CM

## 2016-08-17 DIAGNOSIS — R300 Dysuria: Secondary | ICD-10-CM | POA: Insufficient documentation

## 2016-08-17 DIAGNOSIS — Z79899 Other long term (current) drug therapy: Secondary | ICD-10-CM | POA: Insufficient documentation

## 2016-08-17 DIAGNOSIS — F902 Attention-deficit hyperactivity disorder, combined type: Secondary | ICD-10-CM | POA: Insufficient documentation

## 2016-08-17 DIAGNOSIS — F17210 Nicotine dependence, cigarettes, uncomplicated: Secondary | ICD-10-CM | POA: Insufficient documentation

## 2016-08-17 LAB — URINALYSIS, ROUTINE W REFLEX MICROSCOPIC
Glucose, UA: NEGATIVE mg/dL
Hgb urine dipstick: NEGATIVE
KETONES UR: 5 mg/dL — AB
Nitrite: NEGATIVE
PH: 5 (ref 5.0–8.0)
Protein, ur: 30 mg/dL — AB
Specific Gravity, Urine: 1.033 — ABNORMAL HIGH (ref 1.005–1.030)

## 2016-08-17 MED ORDER — LORAZEPAM 1 MG PO TABS
1.0000 mg | ORAL_TABLET | Freq: Once | ORAL | Status: AC
  Administered 2016-08-17: 1 mg via ORAL
  Filled 2016-08-17: qty 1

## 2016-08-17 NOTE — Discharge Planning (Signed)
Pt was noted to have 11 ED visits within the last 6 months.  Made an appointment with [facility] for July 24th, 2018 at 1400.  Called and left a VM on provided phone number for [facility] asking for a return call with the appointment information.  No additional CM needs noted at this time.

## 2016-08-17 NOTE — ED Notes (Signed)
Every time [facility] goes out into lobby Pt is asking when they are going to go back, when they are going to be seen if the Dr. Lilian Coma come see her in the lobby, her complaints are consistent and are stated to be increasing because she has not gone back to a room yet. [facility] has reassured Pt constantly every time she enters the lobby, that we will get her back to a room ASAP that we are waiting on a room for her. Pt is stating her pain is increasing but Pt is walking without irregularities in her gait and refuses to sit in a chair or wheel chair.

## 2016-08-17 NOTE — Discharge Instructions (Signed)
Please read attached information. If you experience any new or worsening signs or symptoms please return to the emergency room for evaluation. Please follow-up with your primary care provider or specialist as discussed.  °

## 2016-08-17 NOTE — ED Notes (Signed)
Dellis Filbert PA advised pt in triage of being discharged.Pt was also seen by Education officer, museum with an out pt appt at [facility].

## 2016-08-17 NOTE — ED Triage Notes (Addendum)
Pt here via EMS with c/o PTSD, congestion, and abdominal pain. Pt is ambulatory with no complications. Pt was seen yesterday for same.

## 2016-08-17 NOTE — ED Triage Notes (Signed)
Pt reports to the ED for eval of several seconds of chest squeezing. She has been seen here multiple times for these same symptoms and was just d/ced today. She has anxiety and states that she was given ativan today.

## 2016-08-17 NOTE — ED Provider Notes (Signed)
[facility] DEPT Provider Note   CSN: [id number] Arrival date & time: 08/17/16  1236   By signing my name below, I, Soijett Blue, attest that this documentation has been prepared under the direction and in the presence of Lenn Sink, PA-C Electronically Signed: Soijett Blue, ED Scribe. 08/17/16. 3:00 PM.  History   Chief Complaint Chief Complaint  Patient presents with  . Panic Attack    HPI Beverly Torres is a 21 y.o. female with a PMHx of anxiety, ADD, who presents to the Emergency Department complaining of increased anxiety onset today. Pt reports associated chest tightness, neck pain x 2 days, HA x 2 days, fatigue, and decreased PO intake due to increased anxiety. Pt has not tried any medications for the relief of her symptoms. She reports that her associated symptoms occur only when she is emotionally stressed. She notes that she had a dream that she had a MI several months ago which caused the recent exacerbations of her symptoms. Pt reports that she went to [facility] ED to be evaluated on yesterday and LWBS. She notes that she has been evaluated for her symptoms several times in the ED with no acute results and was prescribed Buspar with no relief of her anxiety. She states that she has been on several antidepressant medications that have not alleviated her anxiety. Pt notes that she has had recent stressors of her mother recently getting engaged. Pt reports that she went to [facility] to be evaluated and they didn't have any upcoming appointments. Pt states that she went to [facility] today and they are in the process of setting her up with a PCP. She denies fever, neck stiffness, and any other symptoms. She notes that she smokes cigarettes. Denies PMHx of cardiac issues, HTN, high cholesterol, or using illicit drugs. Pt reports that she does have a small family hx of cardiac issues.   Pt secondarily complains of several non-pruritic insect bites to her torso. She notes that her  mother had bed bugs, but she reports that the house was treated by an exterminator. She has not tried any medications for the relief of her symptoms. Pt denies being outside more recently. Denies any other symptoms.   Per pt chart review: Pt was at [facility]ED last night and LWBS due to the wait. Pt was seen in the ED on 08/14/2016 for CP. Pt had CXR and labs completed with no acute abnormalities. Pt was prescribed prilosec.     The history is provided by the patient. No language interpreter was used.    Past Medical History:  Diagnosis Date  . Acid reflux   . Anxiety   . Anxiety   . Attention deficit disorder     Patient Active Problem List   Diagnosis Date Noted  . Anxiety 05/02/2012  . ADHD (attention deficit hyperactivity disorder), combined type 03/28/2012    Past Surgical History:  Procedure Laterality Date  . ADENOIDECTOMY    . TONSILLECTOMY    . WISDOM TOOTH EXTRACTION      OB History    No data available       Home Medications    Prior to Admission medications   Medication Sig Start Date End Date Taking? Authorizing Provider  busPIRone (BUSPAR) 7.5 MG tablet Take 1 tablet (7.5 mg total) by mouth 2 (two) times daily. 08/03/16   Dorie Rank, MD  cetirizine (ZYRTEC) 10 MG tablet Take 1 tablet (10 mg total) by mouth daily. Patient not taking: Reported on 02/20/2016 06/12/15  Paretta-Leahey, Dawn M, NP  fluticasone (FLONASE) 50 MCG/ACT nasal spray Place 1 spray into both nostrils daily. Patient not taking: Reported on 08/03/2016 07/05/16   [name] [name] A, PA-C  hydrOXYzine (ATARAX/VISTARIL) 50 MG tablet Take 1 tablet (50 mg total) by mouth 3 (three) times daily as needed for anxiety. Patient not taking: Reported on 08/03/2016 06/12/15   Paretta-Leahey, Haze Boyden, NP  LORazepam (ATIVAN) 1 MG tablet Take 1 tablet (1 mg total) by mouth every 8 (eight) hours as needed for anxiety. 08/01/16   Antonietta Breach, PA-C  omeprazole (PRILOSEC) 20 MG capsule Take 1 capsule (20 mg total) by mouth 2  (two) times daily. 08/14/16   Tanna Furry, MD  ranitidine (ZANTAC) 150 MG tablet Take 150 mg by mouth 2 (two) times daily.    [provider]    Family History Family History  Problem Relation Age of Onset  . Anxiety disorder Mother   . Drug abuse Father   . ADD / ADHD Father   . ADD / ADHD Sister   . Anxiety disorder Sister   . ADD / ADHD Brother   . ODD Brother   . Rheum arthritis Maternal Grandmother   . Kidney failure Maternal Grandmother   . Hypertension Maternal Grandfather   . Diabetes Maternal Grandfather   . Arthritis Maternal Grandfather   . Cancer Maternal Grandfather        Melanoma  . Diabetes Paternal Grandfather     Social History Social History  Substance Use Topics  . Smoking status: Current Every Day Smoker    Types: Cigarettes  . Smokeless tobacco: Never Used  . Alcohol use No     Allergies   Patient has no known allergies.   Review of Systems Review of Systems  Constitutional: Positive for appetite change (due to increased anxiety) and fatigue.  Respiratory: Positive for cough and chest tightness.   Musculoskeletal: Positive for neck pain. Negative for neck stiffness.  Neurological: Positive for headaches.  All other systems reviewed and are negative.    Physical Exam Updated Vital Signs BP 113/63 (BP Location: Left Arm)   Pulse (!) 103   Temp 98.3 F (36.8 C) (Oral)   Ht 5\' 2"  (1.575 m)   Wt 208 lb (94.3 kg)   LMP 08/03/2016   SpO2 100%   BMI 38.04 kg/m   Physical Exam  Constitutional: She is oriented to person, place, and time. She appears well-developed and well-nourished. No distress.  HENT:  Head: Normocephalic and atraumatic.  Eyes: EOM are normal.  Neck: Neck supple.  Cardiovascular: Normal rate, regular rhythm and normal heart sounds.  Exam reveals no gallop and no friction rub.   No murmur heard. Pulmonary/Chest: Effort normal and breath sounds normal. No respiratory distress. She has no wheezes. She has no  rales.  Abdominal: She exhibits no distension.  Musculoskeletal: Normal range of motion.  Neurological: She is alert and oriented to person, place, and time.  Skin: Skin is warm and dry.  Pruritic rash to chest with excoriation. No signs of infection.   Psychiatric: Her behavior is normal. Her mood appears anxious.  Nursing note and vitals reviewed.    ED Treatments / Results  DIAGNOSTIC STUDIES: Oxygen Saturation is 100% on RA, nl by my interpretation.    COORDINATION OF CARE: 1:25 PM Discussed treatment plan with pt at bedside which includes ativan and pt agreed to plan.   Labs (all labs ordered are listed, but only abnormal results are displayed) Labs Reviewed -  No data to display  EKG  EKG Interpretation None       Radiology No results found.  Procedures Procedures (including critical care time)  Medications Ordered in ED Medications  LORazepam (ATIVAN) tablet 1 mg (1 mg Oral Given 08/17/16 1336)     Initial Impression / Assessment and Plan / ED Course  I have reviewed the triage vital signs and the nursing notes.  Pertinent labs & imaging results that were available during my care of the patient were reviewed by me and considered in my medical decision making (see chart for details).      Labs:   Imaging:  Consults:  Therapeutics: Ativan  Discharge Meds:   Assessment/Plan: Patient's presentation is most consistent with anxiety.  Patient has a history of the same, is currently being treated.  She has been seen in the emergency room numerous times with normal workups.  Patient having similar symptoms to previous.  I have very low suspicion for any other acute cause including dissection, PE, ACS, or any other life-threatening etiology at this time.  Patient was given 1 dose of Ativan here and discharged with outpatient follow-up.  Case management has scheduled her an appointment with [facility] later on in the month.  I spoke with patient's mother who agrees  that this is typical of her anxiety and will make sure she follows up as an outpatient.  Patient is given return precautions.    Final Clinical Impressions(s) / ED Diagnoses   Final diagnoses:  Panic attack    New Prescriptions Discharge Medication List as of 08/17/2016  1:34 PM     I personally performed the services described in this documentation, which was scribed in my presence. The recorded information has been reviewed and is accurate.    Okey Regal, PA-C 08/17/16 1500    Virgel Manifold, MD 08/18/16 [phone]

## 2016-08-17 NOTE — ED Triage Notes (Signed)
Pt. Stated, I think I have anxiety, Ive had it for awhile 3-4 months. It all started when I dreamed I was having a heart attack. Went to [facility] and they did nothing.

## 2016-08-17 NOTE — ED Notes (Signed)
Patient stating that she is having sharp pains in her chest and back. Patient requested EKG but writer informed her she could not have an EKG if there was no order from the doctor. Patient made aware that she is waiting for provider to sign up for her and waiting for orders.

## 2016-08-18 ENCOUNTER — Emergency Department (HOSPITAL_COMMUNITY)
Admission: EM | Admit: 2016-08-18 | Discharge: 2016-08-18 | Disposition: A | Discharge status: Home / Self Care | Payer: Medicaid Other | Attending: Emergency Medicine | Admitting: Emergency Medicine

## 2016-08-18 ENCOUNTER — Encounter (HOSPITAL_COMMUNITY): Payer: Self-pay

## 2016-08-18 ENCOUNTER — Other Ambulatory Visit: Payer: Self-pay

## 2016-08-18 ENCOUNTER — Encounter (INDEPENDENT_AMBULATORY_CARE_PROVIDER_SITE_OTHER): Payer: Self-pay | Admitting: Physician Assistant

## 2016-08-18 DIAGNOSIS — F419 Anxiety disorder, unspecified: Secondary | ICD-10-CM

## 2016-08-18 DIAGNOSIS — Z79899 Other long term (current) drug therapy: Secondary | ICD-10-CM | POA: Insufficient documentation

## 2016-08-18 DIAGNOSIS — R079 Chest pain, unspecified: Secondary | ICD-10-CM | POA: Insufficient documentation

## 2016-08-18 DIAGNOSIS — R45851 Suicidal ideations: Secondary | ICD-10-CM | POA: Insufficient documentation

## 2016-08-18 DIAGNOSIS — R51 Headache: Secondary | ICD-10-CM | POA: Insufficient documentation

## 2016-08-18 DIAGNOSIS — Z046 Encounter for general psychiatric examination, requested by authority: Secondary | ICD-10-CM | POA: Insufficient documentation

## 2016-08-18 DIAGNOSIS — F17210 Nicotine dependence, cigarettes, uncomplicated: Secondary | ICD-10-CM | POA: Insufficient documentation

## 2016-08-18 DIAGNOSIS — G8929 Other chronic pain: Secondary | ICD-10-CM | POA: Insufficient documentation

## 2016-08-18 LAB — COMPREHENSIVE METABOLIC PANEL
ALK PHOS: 60 U/L (ref 38–126)
ALK PHOS: 69 U/L (ref 38–126)
ALT: 70 U/L — AB (ref 14–54)
ALT: 79 U/L — ABNORMAL HIGH (ref 14–54)
ANION GAP: 8 (ref 5–15)
ANION GAP: 10 (ref 5–15)
AST: 35 U/L (ref 15–41)
AST: 36 U/L (ref 15–41)
Albumin: 4.3 g/dL (ref 3.5–5.0)
Albumin: 4.4 g/dL (ref 3.5–5.0)
BILIRUBIN TOTAL: 0.7 mg/dL (ref 0.3–1.2)
BUN: 10 mg/dL (ref 6–20)
BUN: 11 mg/dL (ref 6–20)
CALCIUM: 9.4 mg/dL (ref 8.9–10.3)
CHLORIDE: 109 mmol/L (ref 101–111)
CO2: 21 mmol/L — AB (ref 22–32)
CO2: 21 mmol/L — ABNORMAL LOW (ref 22–32)
CREATININE: 0.82 mg/dL (ref 0.44–1.00)
CREATININE: 0.92 mg/dL (ref 0.44–1.00)
Calcium: 9.7 mg/dL (ref 8.9–10.3)
Chloride: 108 mmol/L (ref 101–111)
GFR calc non Af Amer: >60 mL/min (ref >60)
GLUCOSE: 101 mg/dL — AB (ref 65–99)
Glucose, Bld: 112 mg/dL — ABNORMAL HIGH (ref 65–99)
Potassium: 3.7 mmol/L (ref 3.5–5.1)
Potassium: 3.7 mmol/L (ref 3.5–5.1)
SODIUM: 137 mmol/L (ref 135–145)
SODIUM: 140 mmol/L (ref 135–145)
TOTAL PROTEIN: 7.1 g/dL (ref 6.5–8.1)
TOTAL PROTEIN: 7.3 g/dL (ref 6.5–8.1)
Total Bilirubin: 0.2 mg/dL — ABNORMAL LOW (ref 0.3–1.2)

## 2016-08-18 LAB — CBC WITH DIFFERENTIAL/PLATELET
Basophils Absolute: 0 10*3/uL (ref 0.0–0.1)
Basophils Relative: 0 %
EOS ABS: 0.2 10*3/uL (ref 0.0–0.7)
Eosinophils Relative: 2 %
HEMATOCRIT: 43 % (ref 36.0–46.0)
HEMOGLOBIN: 14.9 g/dL (ref 12.0–15.0)
LYMPHS ABS: 2.8 10*3/uL (ref 0.7–4.0)
LYMPHS PCT: 30 %
MCH: 30 pg (ref 26.0–34.0)
MCHC: 34.7 g/dL (ref 30.0–36.0)
MCV: 86.7 fL (ref 78.0–100.0)
MONOS PCT: 9 %
Monocytes Absolute: 0.9 10*3/uL (ref 0.1–1.0)
NEUTROS PCT: 59 %
Neutro Abs: 5.5 10*3/uL (ref 1.7–7.7)
Platelets: 299 10*3/uL (ref 150–400)
RBC: 4.96 MIL/uL (ref 3.87–5.11)
RDW: 12.5 % (ref 11.5–15.5)
WBC: 9.4 10*3/uL (ref 4.0–10.5)

## 2016-08-18 LAB — URINALYSIS, ROUTINE W REFLEX MICROSCOPIC
BILIRUBIN URINE: NEGATIVE
Glucose, UA: NEGATIVE mg/dL
HGB URINE DIPSTICK: NEGATIVE
Ketones, ur: 5 mg/dL — AB
NITRITE: NEGATIVE
PH: 5 (ref 5.0–8.0)
Protein, ur: 30 mg/dL — AB
SPECIFIC GRAVITY, URINE: 1.03 (ref 1.005–1.030)

## 2016-08-18 LAB — RAPID URINE DRUG SCREEN, HOSP PERFORMED
AMPHETAMINES: NOT DETECTED
BARBITURATES: POSITIVE — AB
Benzodiazepines: POSITIVE — AB
COCAINE: NOT DETECTED
OPIATES: NOT DETECTED
TETRAHYDROCANNABINOL: NOT DETECTED

## 2016-08-18 LAB — LIPASE, BLOOD
LIPASE: 30 U/L (ref 11–51)
Lipase: 26 U/L (ref 11–51)

## 2016-08-18 LAB — CBC
HEMATOCRIT: 40.9 % (ref 36.0–46.0)
Hemoglobin: 14.2 g/dL (ref 12.0–15.0)
MCH: 30.1 pg (ref 26.0–34.0)
MCHC: 34.7 g/dL (ref 30.0–36.0)
MCV: 86.7 fL (ref 78.0–100.0)
PLATELETS: 301 10*3/uL (ref 150–400)
RBC: 4.72 MIL/uL (ref 3.87–5.11)
RDW: 12.4 % (ref 11.5–15.5)
WBC: 10.6 10*3/uL — AB (ref 4.0–10.5)

## 2016-08-18 LAB — POC URINE PREG, ED: Preg Test, Ur: NEGATIVE

## 2016-08-18 LAB — ETHANOL

## 2016-08-18 LAB — WET PREP, GENITAL
CLUE CELLS WET PREP: NONE SEEN
Sperm: NONE SEEN
Trich, Wet Prep: NONE SEEN
Yeast Wet Prep HPF POC: NONE SEEN

## 2016-08-18 LAB — TROPONIN I: Troponin I: <0.03 ng/mL (ref <0.03)

## 2016-08-18 MED ORDER — HYDROXYZINE HCL 10 MG PO TABS
10.0000 mg | ORAL_TABLET | Freq: Once | ORAL | Status: AC
  Administered 2016-08-18: 10 mg via ORAL
  Filled 2016-08-18: qty 1

## 2016-08-18 MED ORDER — NICOTINE 14 MG/24HR TD PT24
14.0000 mg | MEDICATED_PATCH | Freq: Once | TRANSDERMAL | Status: DC
  Administered 2016-08-18: 14 mg via TRANSDERMAL
  Filled 2016-08-18: qty 1

## 2016-08-18 MED ORDER — CEPHALEXIN 500 MG PO CAPS: 500.0000 mg | ORAL_CAPSULE | Freq: Two times a day (BID) | ORAL | 0 refills | Status: DC

## 2016-08-18 MED ORDER — CEPHALEXIN 500 MG PO CAPS: 500.0000 mg | ORAL_CAPSULE | Freq: Three times a day (TID) | ORAL | 0 refills | Status: DC

## 2016-08-18 MED ORDER — GI COCKTAIL ~~LOC~~
30.0000 mL | Freq: Once | ORAL | Status: AC
  Administered 2016-08-18: 30 mL via ORAL
  Filled 2016-08-18: qty 30

## 2016-08-18 MED ORDER — HYDROXYZINE HCL 25 MG PO TABS
25.0000 mg | ORAL_TABLET | Freq: Once | ORAL | Status: AC
  Administered 2016-08-18: 25 mg via ORAL
  Filled 2016-08-18: qty 1

## 2016-08-18 NOTE — ED Notes (Signed)
Patients EKG given to Rob (PA) and stated he would take care of it to give to MD.

## 2016-08-18 NOTE — ED Notes (Signed)
Pt states that she is ready to go and that she is currently not feeling like she wants to hurt herself, and wants to walk out the door,  pt is cooperative and slightly anxious.MD has been made aware of pt's request.

## 2016-08-18 NOTE — ED Triage Notes (Signed)
Pt c/o chest pressure and pain x 3 days. No SOB or numbness.  Also reports SI without specific plan. Pt very anxious. Denies HI/AVH. Wants to see behavioral health.

## 2016-08-18 NOTE — ED Provider Notes (Signed)
[facility] DEPT Provider Note   CSN: [id number] Arrival date & time: 08/18/16  1516     History   Chief Complaint Chief Complaint  Patient presents with  . Suicidal  . Chest Pain    HPI Beverly Torres is a 22 y.o. female.  HPI   21yo female presents with concern for chest pain and anxiety..  She has presented to the ED 13 times in the last 102mos, including presentations 7/7, 7/9, and 2 times yesterday 7/10 with chest pain.  Has had multiple presentations in the months prior for same. Describes a constant pressure with radiation to neck, at times is stabbing.  Reports she had a dream that she had an MI and is worried about this.   Reports severe anxiety. Denies family hx of heart disease, and denies ht, hlpd, DM.  She does report hx of smoking. NO recent surgeries or immobilization.  Reports she has suicidal ideation but no plan.  Reports cutting herself in the past.   Past Medical History:  Diagnosis Date  . Acid reflux   . Anxiety   . Anxiety   . Attention deficit disorder     Patient Active Problem List   Diagnosis Date Noted  . Anxiety 05/02/2012  . ADHD (attention deficit hyperactivity disorder), combined type 03/28/2012    Past Surgical History:  Procedure Laterality Date  . ADENOIDECTOMY    . TONSILLECTOMY    . WISDOM TOOTH EXTRACTION      OB History    No data available       Home Medications    Prior to Admission medications   Medication Sig Start Date End Date Taking? Authorizing Provider  busPIRone (BUSPAR) 7.5 MG tablet Take 1 tablet (7.5 mg total) by mouth 2 (two) times daily. 08/03/16  Yes Dorie Rank, MD  LORazepam (ATIVAN) 1 MG tablet Take 1 tablet (1 mg total) by mouth every 8 (eight) hours as needed for anxiety. 08/01/16  Yes Antonietta Breach, PA-C  omeprazole (PRILOSEC) 20 MG capsule Take 1 capsule (20 mg total) by mouth 2 (two) times daily. 08/14/16  Yes Tanna Furry, MD  ranitidine (ZANTAC) 150 MG tablet Take 150 mg by mouth 2 (two) times daily.    Yes [provider]  cephALEXin (KEFLEX) 500 MG capsule Take 1 capsule (500 mg total) by mouth 3 (three) times daily. 08/18/16 08/25/16  Gareth Morgan, MD  cetirizine (ZYRTEC) 10 MG tablet Take 1 tablet (10 mg total) by mouth daily. Patient not taking: Reported on 02/20/2016 06/12/15   Paretta-Leahey, Haze Boyden, NP  fluticasone (FLONASE) 50 MCG/ACT nasal spray Place 1 spray into both nostrils daily. Patient not taking: Reported on 08/03/2016 07/05/16   [name] [name] A, PA-C  hydrOXYzine (ATARAX/VISTARIL) 50 MG tablet Take 1 tablet (50 mg total) by mouth 3 (three) times daily as needed for anxiety. Patient not taking: Reported on 08/03/2016 06/12/15   Paretta-LeaheyHaze Boyden, NP    Family History Family History  Problem Relation Age of Onset  . Anxiety disorder Mother   . Drug abuse Father   . ADD / ADHD Father   . ADD / ADHD Sister   . Anxiety disorder Sister   . ADD / ADHD Brother   . ODD Brother   . Rheum arthritis Maternal Grandmother   . Kidney failure Maternal Grandmother   . Hypertension Maternal Grandfather   . Diabetes Maternal Grandfather   . Arthritis Maternal Grandfather   . Cancer Maternal Grandfather  Melanoma  . Diabetes Paternal Grandfather     Social History Social History  Substance Use Topics  . Smoking status: Current Every Day Smoker    Types: Cigarettes  . Smokeless tobacco: Never Used  . Alcohol use No     Allergies   Patient has no known allergies.   Review of Systems Review of Systems  Constitutional: Negative for fever.  HENT: Negative for sore throat.   Eyes: Negative for visual disturbance.  Respiratory: Positive for shortness of breath. Negative for cough.   Cardiovascular: Positive for chest pain.  Gastrointestinal: Negative for abdominal pain, nausea and vomiting.  Genitourinary: Negative for difficulty urinating.  Musculoskeletal: Negative for back pain and neck pain.  Skin: Negative for rash.  Neurological: Negative for  syncope and headaches.  Psychiatric/Behavioral: Positive for sleep disturbance and suicidal ideas. The patient is nervous/anxious.      Physical Exam Updated Vital Signs BP (!) 102/51 (BP Location: Left Arm)   Pulse 64   Temp 98.4 F (36.9 C) (Oral)   Resp 18   LMP 08/03/2016   SpO2 99%   Physical Exam  Constitutional: She is oriented to person, place, and time. She appears well-developed and well-nourished. No distress.  HENT:  Head: Normocephalic and atraumatic.  Eyes: Conjunctivae and EOM are normal.  Neck: Normal range of motion.  Cardiovascular: Normal rate, regular rhythm, normal heart sounds and intact distal pulses.  Exam reveals no gallop and no friction rub.   No murmur heard. Pulmonary/Chest: Effort normal and breath sounds normal. No respiratory distress. She has no wheezes. She has no rales.  Abdominal: Soft. She exhibits no distension. There is no tenderness. There is no guarding.  Musculoskeletal: She exhibits no edema or tenderness.  Neurological: She is alert and oriented to person, place, and time.  Skin: Skin is warm and dry. No rash noted. She is not diaphoretic. No erythema.  Psychiatric: She is not actively hallucinating. She expresses suicidal ideation. She expresses no homicidal ideation. She expresses no suicidal plans and no homicidal plans.  Nursing note and vitals reviewed.    ED Treatments / Results  Labs (all labs ordered are listed, but only abnormal results are displayed) Labs Reviewed  COMPREHENSIVE METABOLIC PANEL - Abnormal; Notable for the following:       Result Value   CO2 21 (*)    Glucose, Bld 112 (*)    ALT 70 (*)    All other components within normal limits  URINALYSIS, ROUTINE W REFLEX MICROSCOPIC - Abnormal; Notable for the following:    APPearance HAZY (*)    Ketones, ur 5 (*)    Protein, ur 30 (*)    Leukocytes, UA LARGE (*)    Bacteria, UA RARE (*)    Squamous Epithelial / LPF 6-30 (*)    All other components within  normal limits  RAPID URINE DRUG SCREEN, HOSP PERFORMED - Abnormal; Notable for the following:    Benzodiazepines POSITIVE (*)    Barbiturates POSITIVE (*)    All other components within normal limits  CBC WITH DIFFERENTIAL/PLATELET  LIPASE, BLOOD  ETHANOL  TROPONIN I  POC URINE PREG, ED    EKG  EKG Interpretation  Date/Time:  Wednesday August 18 2016 15:35:04 EDT Ventricular Rate:  107 PR Interval:    QRS Duration: 86 QT Interval:  349 QTC Calculation: 466 R Axis:   100 Text Interpretation:  Sinus tachycardia Borderline right axis deviation Confirmed by Nat Christen [phone]) on 08/19/2016 1:03:17 PM  Radiology No results found.  Procedures Procedures (including critical care time)  Medications Ordered in ED Medications  hydrOXYzine (ATARAX/VISTARIL) tablet 25 mg (25 mg Oral Given 08/18/16 1736)     Initial Impression / Assessment and Plan / ED Course  I have reviewed the triage vital signs and the nursing notes.  Pertinent labs & imaging results that were available during my care of the patient were reviewed by me and considered in my medical decision making (see chart for details).     21yo female presents with concern for chest pain and anxiety..  She has presented to the ED 13 times in the last 60mos, including presentations 7/7, 7/9, and 2 times yesterday 7/10 with chest pain.  Has had multiple presentations in the months prior for same.  EKG without acute changes, with sinus tachycardia likely in setting of anxiety. Labs obtained without acute abnormalities.  Discussed with patient, reassured. Consulted TTS given suicidal ideation reported to me. Gave vistaril for anxiety. She denied SI to TTS and on my recheck. Given passive SI previously, pt contracting for safety, feel she is safe for discharge and outpt follow up.   Final Clinical Impressions(s) / ED Diagnoses   Final diagnoses:  Chest pain, unspecified type  Anxiety    New Prescriptions Discharge  Medication List as of 08/18/2016  8:23 PM       Gareth Morgan, MD 08/19/16 1913

## 2016-08-18 NOTE — ED Notes (Signed)
Nicotine patch removed pt stated that she was having itching.

## 2016-08-18 NOTE — ED Notes (Signed)
Patient took the portable monitor out of the big monitor at bedside to bring to writer to show writer her vital signs. Writer was just in room less than thirty minutes ago to explain to patient what was going on and where she was at in her next steps for her care. Patient educated that she is not to touch equipment in room, as she could hurt herself or the monitor in doing so. RN also aware.

## 2016-08-18 NOTE — ED Provider Notes (Signed)
[facility] DEPT Provider Note   CSN: [id number] Arrival date & time: 08/17/16  2135     History   Chief Complaint Chief Complaint  Patient presents with  . Abdominal Pain  . Anxiety    HPI Beverly Torres is a 21 y.o. female.  Patient presents to the ED with multiple complaints.  1. Abdominal pain/dysuria.  She states that she has had these symptoms for the past several days.  She was seen earlier and [facility] for the same.  She states that she would like to be tested for STDs.  She denies fevers, chills, n/v/d.  She reports some associated dysuria.  2.  Patient complains of chest pain.  She states that this started when she got to the ER.  She has a history of panic attack, and states this felt similar to prior panic attack, but also wanted to be certain she wasn't having a heart attack.   The history is provided by the patient. No language interpreter was used.    Past Medical History:  Diagnosis Date  . Acid reflux   . Anxiety   . Anxiety   . Attention deficit disorder     Patient Active Problem List   Diagnosis Date Noted  . Anxiety 05/02/2012  . ADHD (attention deficit hyperactivity disorder), combined type 03/28/2012    Past Surgical History:  Procedure Laterality Date  . ADENOIDECTOMY    . TONSILLECTOMY    . WISDOM TOOTH EXTRACTION      OB History    No data available       Home Medications    Prior to Admission medications   Medication Sig Start Date End Date Taking? Authorizing Provider  busPIRone (BUSPAR) 7.5 MG tablet Take 1 tablet (7.5 mg total) by mouth 2 (two) times daily. 08/03/16   Dorie Rank, MD  cephALEXin (KEFLEX) 500 MG capsule Take 1 capsule (500 mg total) by mouth 2 (two) times daily. 08/18/16   Montine Circle, PA-C  cetirizine (ZYRTEC) 10 MG tablet Take 1 tablet (10 mg total) by mouth daily. Patient not taking: Reported on 02/20/2016 06/12/15   Paretta-Leahey, Haze Boyden, NP  fluticasone (FLONASE) 50 MCG/ACT nasal spray Place 1 spray into  both nostrils daily. Patient not taking: Reported on 08/03/2016 07/05/16   [name] [name] A, PA-C  hydrOXYzine (ATARAX/VISTARIL) 50 MG tablet Take 1 tablet (50 mg total) by mouth 3 (three) times daily as needed for anxiety. Patient not taking: Reported on 08/03/2016 06/12/15   Paretta-Leahey, Haze Boyden, NP  LORazepam (ATIVAN) 1 MG tablet Take 1 tablet (1 mg total) by mouth every 8 (eight) hours as needed for anxiety. 08/01/16   Antonietta Breach, PA-C  omeprazole (PRILOSEC) 20 MG capsule Take 1 capsule (20 mg total) by mouth 2 (two) times daily. 08/14/16   Tanna Furry, MD  ranitidine (ZANTAC) 150 MG tablet Take 150 mg by mouth 2 (two) times daily.    [provider]    Family History Family History  Problem Relation Age of Onset  . Anxiety disorder Mother   . Drug abuse Father   . ADD / ADHD Father   . ADD / ADHD Sister   . Anxiety disorder Sister   . ADD / ADHD Brother   . ODD Brother   . Rheum arthritis Maternal Grandmother   . Kidney failure Maternal Grandmother   . Hypertension Maternal Grandfather   . Diabetes Maternal Grandfather   . Arthritis Maternal Grandfather   . Cancer Maternal Grandfather  Melanoma  . Diabetes Paternal Grandfather     Social History Social History  Substance Use Topics  . Smoking status: Current Every Day Smoker    Types: Cigarettes  . Smokeless tobacco: Never Used  . Alcohol use No     Allergies   Patient has no known allergies.   Review of Systems Review of Systems  All other systems reviewed and are negative.    Physical Exam Updated Vital Signs BP 126/75 (BP Location: Right Arm)   Pulse 85   Temp 98.2 F (36.8 C) (Oral)   Resp 16   Ht 5\' 2"  (1.575 m)   Wt 94.4 kg (208 lb 3.2 oz)   LMP 08/03/2016   SpO2 100%   BMI 38.08 kg/m   Physical Exam  Constitutional: She is oriented to person, place, and time. She appears well-developed and well-nourished.  HENT:  Head: Normocephalic and atraumatic.  Eyes: Conjunctivae and EOM  are normal. Pupils are equal, round, and reactive to light.  Neck: Normal range of motion. Neck supple.  Cardiovascular: Normal rate and regular rhythm.  Exam reveals no gallop and no friction rub.   No murmur heard. Pulmonary/Chest: Effort normal and breath sounds normal. No respiratory distress. She has no wheezes. She has no rales. She exhibits no tenderness.  Abdominal: Soft. Bowel sounds are normal. She exhibits no distension and no mass. There is no tenderness. There is no rebound and no guarding.  Genitourinary:  Genitourinary Comments: Pelvic exam chaperoned by female ER tech, no right or left adnexal tenderness, no uterine tenderness, no vaginal discharge, no bleeding, no CMT or friability, no foreign body, no injury to the external genitalia, no other significant findings   Musculoskeletal: Normal range of motion. She exhibits no edema or tenderness.  Neurological: She is alert and oriented to person, place, and time.  Skin: Skin is warm and dry.  Psychiatric: She has a normal mood and affect. Her behavior is normal. Judgment and thought content normal.  Nursing note and vitals reviewed.    ED Treatments / Results  Labs (all labs ordered are listed, but only abnormal results are displayed) Labs Reviewed  WET PREP, GENITAL - Abnormal; Notable for the following:       Result Value   WBC, Wet Prep HPF POC MODERATE (*)    All other components within normal limits  COMPREHENSIVE METABOLIC PANEL - Abnormal; Notable for the following:    CO2 21 (*)    Glucose, Bld 101 (*)    ALT 79 (*)    Total Bilirubin 0.2 (*)    All other components within normal limits  CBC - Abnormal; Notable for the following:    WBC 10.6 (*)    All other components within normal limits  URINALYSIS, ROUTINE W REFLEX MICROSCOPIC - Abnormal; Notable for the following:    APPearance HAZY (*)    Specific Gravity, Urine 1.033 (*)    Bilirubin Urine SMALL (*)    Ketones, ur 5 (*)    Protein, ur 30 (*)     Leukocytes, UA SMALL (*)    Bacteria, UA RARE (*)    Squamous Epithelial / LPF 6-30 (*)    All other components within normal limits  LIPASE, BLOOD  GC/CHLAMYDIA PROBE AMP ([facility]) NOT AT [facility]    EKG  EKG Interpretation None       Radiology No results found.  Procedures Procedures (including critical care time)  Medications Ordered in ED Medications  hydrOXYzine (ATARAX/VISTARIL) tablet 10  mg (not administered)  gi cocktail (Maalox,Lidocaine,Donnatal) (30 mLs Oral Given 08/18/16 0128)     Initial Impression / Assessment and Plan / ED Course  I have reviewed the triage vital signs and the nursing notes.  Pertinent labs & imaging results that were available during my care of the patient were reviewed by me and considered in my medical decision making (see chart for details).     Patient with anxiety and dysuria.  No focal abdominal tenderness.  Patient is in NAD.  VSS normal.  Will treat for dysuria.  PCP follow-up.  Labs are reassuring.  Final Clinical Impressions(s) / ED Diagnoses   Final diagnoses:  Anxiety  Dysuria    New Prescriptions New Prescriptions   CEPHALEXIN (KEFLEX) 500 MG CAPSULE    Take 1 capsule (500 mg total) by mouth 2 (two) times daily.     Montine Circle, PA-C 08/18/16 0370    Randal Buba, April, MD 08/18/16 0730

## 2016-08-18 NOTE — ED Triage Notes (Addendum)
Pt c/o chest pressure and pain x 3 days. No SOB or numbness.  Denies SI/HI/AVH. Pt requests an EKG, Labs and declines [employer] resources.
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
## 2016-08-19 ENCOUNTER — Emergency Department (HOSPITAL_COMMUNITY)
Admission: EM | Admit: 2016-08-19 | Discharge: 2016-08-19 | Disposition: A | Discharge status: Home / Self Care | Payer: Medicaid Other | Attending: Emergency Medicine | Admitting: Emergency Medicine

## 2016-08-19 ENCOUNTER — Other Ambulatory Visit: Payer: Self-pay

## 2016-08-19 ENCOUNTER — Encounter (HOSPITAL_COMMUNITY): Payer: Self-pay

## 2016-08-19 ENCOUNTER — Encounter (HOSPITAL_COMMUNITY): Payer: Self-pay | Admitting: Emergency Medicine

## 2016-08-19 ENCOUNTER — Emergency Department (HOSPITAL_COMMUNITY)
Admission: EM | Admit: 2016-08-19 | Discharge: 2016-08-20 | Disposition: A | Discharge status: Home / Self Care | Payer: Medicaid Other | Attending: Emergency Medicine | Admitting: Emergency Medicine

## 2016-08-19 DIAGNOSIS — R0789 Other chest pain: Secondary | ICD-10-CM | POA: Insufficient documentation

## 2016-08-19 DIAGNOSIS — Z7982 Long term (current) use of aspirin: Secondary | ICD-10-CM | POA: Insufficient documentation

## 2016-08-19 DIAGNOSIS — F17210 Nicotine dependence, cigarettes, uncomplicated: Secondary | ICD-10-CM | POA: Insufficient documentation

## 2016-08-19 DIAGNOSIS — Z79899 Other long term (current) drug therapy: Secondary | ICD-10-CM | POA: Insufficient documentation

## 2016-08-19 DIAGNOSIS — G8929 Other chronic pain: Secondary | ICD-10-CM

## 2016-08-19 DIAGNOSIS — F902 Attention-deficit hyperactivity disorder, combined type: Secondary | ICD-10-CM | POA: Insufficient documentation

## 2016-08-19 DIAGNOSIS — R079 Chest pain, unspecified: Secondary | ICD-10-CM

## 2016-08-19 DIAGNOSIS — F419 Anxiety disorder, unspecified: Secondary | ICD-10-CM | POA: Insufficient documentation

## 2016-08-19 LAB — GC/CHLAMYDIA PROBE AMP (~~LOC~~) NOT AT ARMC
CHLAMYDIA, DNA PROBE: NEGATIVE
Neisseria Gonorrhea: NEGATIVE

## 2016-08-19 LAB — D-DIMER, QUANTITATIVE: D-Dimer, Quant: <0.27 ug/mL-FEU (ref 0.00–0.50)

## 2016-08-19 MED ORDER — IBUPROFEN 800 MG PO TABS
800.0000 mg | ORAL_TABLET | Freq: Once | ORAL | Status: AC
  Administered 2016-08-19: 800 mg via ORAL
  Filled 2016-08-19: qty 1

## 2016-08-19 MED ORDER — ACETAMINOPHEN 325 MG PO TABS
650.0000 mg | ORAL_TABLET | Freq: Once | ORAL | Status: AC
  Administered 2016-08-19: 650 mg via ORAL
  Filled 2016-08-19: qty 2

## 2016-08-19 MED ORDER — GI COCKTAIL ~~LOC~~
30.0000 mL | Freq: Once | ORAL | Status: AC
  Administered 2016-08-19: 30 mL via ORAL
  Filled 2016-08-19: qty 30

## 2016-08-19 NOTE — ED Notes (Signed)
Bed: [facility] Expected date:  Expected time:  Means of arrival:  Comments: 

## 2016-08-19 NOTE — ED Notes (Signed)
Patient is at nurse first desk.  Says her ears are throbbing and head is throbbing and stabbing pains in her chest.

## 2016-08-19 NOTE — ED Notes (Signed)
Pt continues to ask for repeat EKG and repeat vitals and pacing around the waiting room.

## 2016-08-19 NOTE — ED Triage Notes (Signed)
Pt just DC from [facility], seen for chest wall pain which has been present X several months. Pt unhappy with dx there

## 2016-08-19 NOTE — ED Notes (Signed)
Bed: [facility] Expected date:  Expected time:  Means of arrival:  Comments: 

## 2016-08-19 NOTE — ED Notes (Signed)
The patient is repeatedly asking for staff to recheck her vitals and complains about the wait time and that we don't care about her. Reassured the patient that she will get a room and that there are two people in the waiting room ahead of her.

## 2016-08-19 NOTE — ED Notes (Signed)
Pt refuses to sit in her bed. She states that shes too anxious to sit

## 2016-08-19 NOTE — ED Notes (Signed)
Pt ambulatory and independent at discharge.  Verbalized understanding of discharge instructions 

## 2016-08-19 NOTE — ED Provider Notes (Signed)
[place] DEPT Provider Note   CSN: [id number] Arrival date & time: 08/18/16  2253     History   Chief Complaint Chief Complaint  Patient presents with  . Anxiety  . Psychiatric Evaluation    HPI Beverly Torres is a 21 y.o. female.  21 year old female with a history of anxiety and PTSD presents to the emergency department for persistent chest pain. She is well-known to the emergency department with 3 visits yesterday and one visit earlier today. She describes a sharp, poking pain in her left chest. This is reproducible on palpation. She has also had a generalized pressure headache since discharge. No medications taken prior to arrival for symptoms. She was given a tablet of Vistaril during her previous encounter which, she believes, made her more anxious. She has follow-up with an outpatient psychiatric facility in one week.      Past Medical History:  Diagnosis Date  . Acid reflux   . Anxiety   . Anxiety   . Attention deficit disorder     Patient Active Problem List   Diagnosis Date Noted  . Anxiety 05/02/2012  . ADHD (attention deficit hyperactivity disorder), combined type 03/28/2012    Past Surgical History:  Procedure Laterality Date  . ADENOIDECTOMY    . TONSILLECTOMY    . WISDOM TOOTH EXTRACTION      OB History    No data available       Home Medications    Prior to Admission medications   Medication Sig Start Date End Date Taking? Authorizing Provider  busPIRone (BUSPAR) 7.5 MG tablet Take 1 tablet (7.5 mg total) by mouth 2 (two) times daily. 08/03/16  Yes Dorie Rank, MD  LORazepam (ATIVAN) 1 MG tablet Take 1 tablet (1 mg total) by mouth every 8 (eight) hours as needed for anxiety. 08/01/16  Yes Antonietta Breach, PA-C  omeprazole (PRILOSEC) 20 MG capsule Take 1 capsule (20 mg total) by mouth 2 (two) times daily. 08/14/16  Yes Tanna Furry, MD  ranitidine (ZANTAC) 150 MG tablet Take 150 mg by mouth 2 (two) times daily.   Yes [provider]    cephALEXin (KEFLEX) 500 MG capsule Take 1 capsule (500 mg total) by mouth 3 (three) times daily. 08/18/16 08/25/16  Gareth Morgan, MD  cetirizine (ZYRTEC) 10 MG tablet Take 1 tablet (10 mg total) by mouth daily. Patient not taking: Reported on 02/20/2016 06/12/15   Paretta-Leahey, Haze Boyden, NP  fluticasone (FLONASE) 50 MCG/ACT nasal spray Place 1 spray into both nostrils daily. Patient not taking: Reported on 08/03/2016 07/05/16   [name] [name] A, PA-C  hydrOXYzine (ATARAX/VISTARIL) 50 MG tablet Take 1 tablet (50 mg total) by mouth 3 (three) times daily as needed for anxiety. Patient not taking: Reported on 08/03/2016 06/12/15   Paretta-LeaheyHaze Boyden, NP    Family History Family History  Problem Relation Age of Onset  . Anxiety disorder Mother   . Drug abuse Father   . ADD / ADHD Father   . ADD / ADHD Sister   . Anxiety disorder Sister   . ADD / ADHD Brother   . ODD Brother   . Rheum arthritis Maternal Grandmother   . Kidney failure Maternal Grandmother   . Hypertension Maternal Grandfather   . Diabetes Maternal Grandfather   . Arthritis Maternal Grandfather   . Cancer Maternal Grandfather        Melanoma  . Diabetes Paternal Grandfather     Social History Social History  Substance Use Topics  .  Smoking status: Current Every Day Smoker    Types: Cigarettes  . Smokeless tobacco: Never Used  . Alcohol use No     Allergies   Patient has no known allergies.   Review of Systems Review of Systems Ten systems reviewed and are negative for acute change, except as noted in the HPI.    Physical Exam Updated Vital Signs BP (!) 100/55 (BP Location: Left Arm)   Pulse 77   Temp 97.9 F (36.6 C)   Resp 16   LMP 08/03/2016   SpO2 100%   Physical Exam  Constitutional: She is oriented to person, place, and time. She appears well-developed and well-nourished. No distress.  Nontoxic and in NAD  HENT:  Head: Normocephalic and atraumatic.  Eyes: Conjunctivae and EOM are normal.  No scleral icterus.  Neck: Normal range of motion.  Cardiovascular: Normal rate, regular rhythm and intact distal pulses.   Pulmonary/Chest: Effort normal. No respiratory distress. She has no wheezes. She has no rales.  Lungs CTAB  Musculoskeletal: Normal range of motion.  Neurological: She is alert and oriented to person, place, and time. She exhibits normal muscle tone. Coordination normal.  Ambulatory with steady gait. GCS 15.  Skin: Skin is warm and dry. No rash noted. She is not diaphoretic. No erythema. No pallor.  Psychiatric: Her behavior is normal. Her mood appears anxious. She expresses no homicidal and no suicidal ideation.  Nursing note and vitals reviewed.    ED Treatments / Results  Labs (all labs ordered are listed, but only abnormal results are displayed) Labs Reviewed  D-DIMER, QUANTITATIVE (NOT AT [facility])    EKG  EKG Interpretation None       Radiology No results found.  Procedures Procedures (including critical care time)  Medications Ordered in ED Medications  acetaminophen (TYLENOL) tablet 650 mg (650 mg Oral Given 08/19/16 0118)     Initial Impression / Assessment and Plan / ED Course  I have reviewed the triage vital signs and the nursing notes.  Pertinent labs & imaging results that were available during my care of the patient were reviewed by me and considered in my medical decision making (see chart for details).     21 year old female presents to the emergency department for persistent chest pain. She has been seen multiple times in the past for similar complaints. Laboratory workup earlier today reassuring. D-dimer added as this has not been completed in the past. Dimer negative. Symptoms atypical for PE.  Patient with known history of anxiety as well as reflux. Strong belief that a combination of these medical conditions are responsible for her persistent pain. Doubt emergent process. The patient is being followed by an outpatient  psychiatrist. She has been instructed to keep this appointment. No reports of SI during encounter today. Patient discharged in stable condition.   Final Clinical Impressions(s) / ED Diagnoses   Final diagnoses:  Chronic chest pain    New Prescriptions Discharge Medication List as of 08/19/2016  2:47 AM       Antonietta Breach, PA-C 08/19/16 0303    Varney Biles, MD 08/19/16 4034

## 2016-08-19 NOTE — ED Notes (Signed)
Pt at [facility] asking how much longer even though this RN already told her a room has been assigned. Wants her vitals rechecked and I told her no b/c vitals were just done.

## 2016-08-19 NOTE — ED Provider Notes (Signed)
[place] [facility] DEPT Provider Note   CSN: [id number] Arrival date & time: 08/19/16  1528  By signing my name below, I, Mayer Masker, attest that this documentation has been prepared under the direction and in the presence of Hadley Soileau, PA-C. Electronically Signed: Mayer Masker, Scribe. 08/19/16. 6:02 PM.  History   Chief Complaint Chief Complaint  Patient presents with  . Anxiety  The history is provided by the patient. No language interpreter was used.    HPI Comments: Beverly Torres is a 21 y.o. female with PMHx of chronic chest pains and anxiety who presents to the Emergency Department complaining of constant, sharp left-sided chest wall tenderness that radiates to her back since she woke up this afternoon. She states this pain is worse when she raises her arms. Pt was seen for this same issue two times yesterday with extensive negative workups. She states her chest tenderness today was not as bad yesterday as it is today. She has associated diaphoresis when she sleeps or walks around, pressure in her left arm, numbness/tingling, head pressure, and she states "everything is brighter". She states she had SOB when she was having an anxiety disorder 3 months ago. She describes her back pain as if someone is sitting on her back. She denies any new or recent activities or exercises. She denies weakness. Pt has no PMHx or FMHx of MI or cardiac disease. She takes hydralazine daily for her anxiety.   She also complains of sinus pain and congestion for a couple of months. She has associated productive cough and rhinorrhea. She was recently diagnosed with a UTI but denies any Abx use due to not being able to get the prescription.  Past Medical History:  Diagnosis Date  . Acid reflux   . Anxiety   . Anxiety   . Attention deficit disorder     Patient Active Problem List   Diagnosis Date Noted  . Anxiety 05/02/2012  . ADHD (attention deficit hyperactivity disorder), combined type 03/28/2012     Past Surgical History:  Procedure Laterality Date  . ADENOIDECTOMY    . TONSILLECTOMY    . WISDOM TOOTH EXTRACTION      OB History    No data available       Home Medications    Prior to Admission medications   Medication Sig Start Date End Date Taking? Authorizing Provider  busPIRone (BUSPAR) 7.5 MG tablet Take 1 tablet (7.5 mg total) by mouth 2 (two) times daily. 08/03/16   Dorie Rank, MD  cephALEXin (KEFLEX) 500 MG capsule Take 1 capsule (500 mg total) by mouth 3 (three) times daily. 08/18/16 08/25/16  Gareth Morgan, MD  cetirizine (ZYRTEC) 10 MG tablet Take 1 tablet (10 mg total) by mouth daily. Patient not taking: Reported on 02/20/2016 06/12/15   Paretta-Leahey, Haze Boyden, NP  fluticasone (FLONASE) 50 MCG/ACT nasal spray Place 1 spray into both nostrils daily. Patient not taking: Reported on 08/03/2016 07/05/16   [name] [name] A, PA-C  hydrOXYzine (ATARAX/VISTARIL) 50 MG tablet Take 1 tablet (50 mg total) by mouth 3 (three) times daily as needed for anxiety. Patient not taking: Reported on 08/03/2016 06/12/15   Paretta-Leahey, Haze Boyden, NP  LORazepam (ATIVAN) 1 MG tablet Take 1 tablet (1 mg total) by mouth every 8 (eight) hours as needed for anxiety. 08/01/16   Antonietta Breach, PA-C  omeprazole (PRILOSEC) 20 MG capsule Take 1 capsule (20 mg total) by mouth 2 (two) times daily. 08/14/16   Tanna Furry, MD  ranitidine (ZANTAC) 150  MG tablet Take 150 mg by mouth 2 (two) times daily.    [provider]    Family History Family History  Problem Relation Age of Onset  . Anxiety disorder Mother   . Drug abuse Father   . ADD / ADHD Father   . ADD / ADHD Sister   . Anxiety disorder Sister   . ADD / ADHD Brother   . ODD Brother   . Rheum arthritis Maternal Grandmother   . Kidney failure Maternal Grandmother   . Hypertension Maternal Grandfather   . Diabetes Maternal Grandfather   . Arthritis Maternal Grandfather   . Cancer Maternal Grandfather        Melanoma  . Diabetes  Paternal Grandfather     Social History Social History  Substance Use Topics  . Smoking status: Current Every Day Smoker    Types: Cigarettes  . Smokeless tobacco: Never Used  . Alcohol use No     Allergies   Patient has no known allergies.   Review of Systems Review of Systems  Constitutional: Positive for diaphoresis.  HENT: Positive for congestion and sinus pain. Negative for rhinorrhea.   Respiratory: Positive for cough and shortness of breath.   Musculoskeletal: Positive for back pain and myalgias.  Neurological: Positive for numbness and headaches. Negative for weakness.  Psychiatric/Behavioral: The patient is nervous/anxious.      Physical Exam Updated Vital Signs BP (!) 143/80 (BP Location: Left Arm)   Pulse 81   Temp 98 F (36.7 C) (Oral)   Resp 20   LMP 08/03/2016   SpO2 98%   Physical Exam  Constitutional: No distress.  Disheveled. Appears unkept.   HENT:  Head: Normocephalic.  Right Ear: External ear normal. Tympanic membrane is erythematous. Tympanic membrane is not injected and not perforated.  Left Ear: Tympanic membrane and external ear normal. Tympanic membrane is not injected, not perforated and not erythematous.  Nose: Nose normal.  Eyes: Conjunctivae are normal.  Neck: Neck supple.  Cardiovascular: Normal rate, regular rhythm, normal heart sounds and intact distal pulses.  Exam reveals no gallop and no friction rub.   No murmur heard. Pulmonary/Chest: Effort normal and breath sounds normal. No respiratory distress. She has no wheezes. She has no rales.  Lungs are clear to auscultation bilaterally. No acute distress. Multiple discrete lesions, consistent with insect bites across the anterior chest.  Abdominal: Soft. She exhibits no distension.  Musculoskeletal: Normal range of motion. She exhibits no edema or deformity.  Neurological: She is alert.  Ambulatory without difficulty. Able to move all extremities.  Skin: Skin is warm. Capillary  refill takes less than 2 seconds. No rash noted.  Psychiatric: Her behavior is normal. Her mood appears anxious. She expresses no homicidal and no suicidal ideation. She expresses no suicidal plans and no homicidal plans.  Nursing note and vitals reviewed.  ED Treatments / Results  DIAGNOSTIC STUDIES: Oxygen Saturation is 100% on RA, normal by my interpretation.    COORDINATION OF CARE: 6:02 PM Discussed treatment plan with pt at bedside and pt agreed to plan. Labs (all labs ordered are listed, but only abnormal results are displayed) Labs Reviewed - No data to display  EKG  EKG Interpretation None       Radiology No results found.  Procedures Procedures (including critical care time)  Medications Ordered in ED Medications  ibuprofen (ADVIL,MOTRIN) tablet 800 mg (800 mg Oral Given 08/19/16 1844)  gi cocktail (Maalox,Lidocaine,Donnatal) (30 mLs Oral Given 08/19/16 1844)  Initial Impression / Assessment and Plan / ED Course  I have reviewed the triage vital signs and the nursing notes.  Pertinent labs & imaging results that were available during my care of the patient were reviewed by me and considered in my medical decision making (see chart for details).     Patient presenting with a history of multiple medical complaints. She reports left-sided chest pain worse with movement of the left arm. Non-tachy. Afebrile. EKG unremarkable. Reproducible tenderness to palpation. The patient has previously undergone an extensive work up, including 2 visits within the last 24 hours, for the same complaint. The patient has a history of anxiety and is noted to be pacing in the room and requesting nursing staff to take her blood pressure every few minutes because she is concerned she is having an acute MI. Provided extensive reassurance to the patient regarding her history, physical exam, and EKG findings along with her lab work completed within the last 24 hours. At this time, with shared  decision making with the patient, I do not feel the need to repeat her blood work, or chest x-ray which was last completed on 7/6. The patient is requesting pain medication and a GI cocktail. I will give her dose of ibuprofen and a GI cocktail during this visit. The patient has an appointment with primary care around the 24th of this month. Encouraged her to keep that appointment that she is not currently established with primary care. Encouraged her to finish her course of antibiotics for her UTI. Vital signs stable. No acute distress. The patient is stable for discharge at this time.   Final Clinical Impressions(s) / ED Diagnoses   Final diagnoses:  Anxiety  Chest wall pain    New Prescriptions New Prescriptions   No medications on file  I personally performed the services described in this documentation, which was scribed in my presence. The recorded information has been reviewed and is accurate.    Joanne Gavel, PA-C 08/19/16 1845    Dorie Rank, MD 08/20/16 [phone]

## 2016-08-19 NOTE — ED Provider Notes (Signed)
[place] DEPT Provider Note   CSN: [id number] Arrival date & time: 08/19/16  2028  By signing my name below, I, Marcello Moores, attest that this documentation has been prepared under the direction and in the presence of Horton, Barbette Hair, MD. Electronically Signed: Marcello Moores, ED Scribe. 08/20/16. 12:23 AM.  History   Chief Complaint Chief Complaint  Patient presents with  . Chest Wall Pain   The history is provided by the patient. No language interpreter was used.   HPI Comments: Beverly Torres is a 21 y.o. female who presents to the Emergency Department complaining of a "poking" left chest wall pain that began 3 months ago. She rates her pain as a 7/10. The pt was seen 2x yesterday at [facility] and wanted a second opinion of her pain. She has previously been seen multiple times and evaluated. Evaluations to include chest x-ray, lab work, and a d-dimer yesterday was negative. She has had a full work-up that has been reassuring. Palpating on the area exacerbates her pain. The pt has a PMHx of anxiety, and PTSD. She denies IVDU and alcohol abuse. She also denies SOB, diaphoresis, and fever.   Past Medical History:  Diagnosis Date  . Acid reflux   . Anxiety   . Anxiety   . Attention deficit disorder     Patient Active Problem List   Diagnosis Date Noted  . Anxiety 05/02/2012  . ADHD (attention deficit hyperactivity disorder), combined type 03/28/2012    Past Surgical History:  Procedure Laterality Date  . ADENOIDECTOMY    . TONSILLECTOMY    . WISDOM TOOTH EXTRACTION      OB History    No data available       Home Medications    Prior to Admission medications   Medication Sig Start Date End Date Taking? Authorizing Provider  busPIRone (BUSPAR) 7.5 MG tablet Take 1 tablet (7.5 mg total) by mouth 2 (two) times daily. 08/03/16   Dorie Rank, MD  cephALEXin (KEFLEX) 500 MG capsule Take 1 capsule (500 mg total) by mouth 3 (three) times daily. 08/18/16 08/25/16   Gareth Morgan, MD  cetirizine (ZYRTEC) 10 MG tablet Take 1 tablet (10 mg total) by mouth daily. Patient not taking: Reported on 02/20/2016 06/12/15   Paretta-Leahey, Haze Boyden, NP  fluticasone (FLONASE) 50 MCG/ACT nasal spray Place 1 spray into both nostrils daily. Patient not taking: Reported on 08/03/2016 07/05/16   [name] [name] A, PA-C  hydrOXYzine (ATARAX/VISTARIL) 50 MG tablet Take 1 tablet (50 mg total) by mouth 3 (three) times daily as needed for anxiety. Patient not taking: Reported on 08/03/2016 06/12/15   Paretta-Leahey, Haze Boyden, NP  LORazepam (ATIVAN) 1 MG tablet Take 1 tablet (1 mg total) by mouth every 8 (eight) hours as needed for anxiety. 08/01/16   Antonietta Breach, PA-C  naproxen (NAPROSYN) 500 MG tablet Take 1 tablet (500 mg total) by mouth 2 (two) times daily. 08/20/16   Horton, Barbette Hair, MD  omeprazole (PRILOSEC) 20 MG capsule Take 1 capsule (20 mg total) by mouth 2 (two) times daily. 08/14/16   Tanna Furry, MD  ranitidine (ZANTAC) 150 MG tablet Take 150 mg by mouth 2 (two) times daily.    [provider]    Family History Family History  Problem Relation Age of Onset  . Anxiety disorder Mother   . Drug abuse Father   . ADD / ADHD Father   . ADD / ADHD Sister   . Anxiety disorder Sister   .  ADD / ADHD Brother   . ODD Brother   . Rheum arthritis Maternal Grandmother   . Kidney failure Maternal Grandmother   . Hypertension Maternal Grandfather   . Diabetes Maternal Grandfather   . Arthritis Maternal Grandfather   . Cancer Maternal Grandfather        Melanoma  . Diabetes Paternal Grandfather     Social History Social History  Substance Use Topics  . Smoking status: Current Every Day Smoker    Types: Cigarettes  . Smokeless tobacco: Never Used  . Alcohol use No     Allergies   Patient has no known allergies.   Review of Systems Review of Systems  Constitutional: Negative for diaphoresis and fever.  Respiratory: Negative for shortness of breath.     Cardiovascular: Positive for chest pain. Negative for leg swelling.  Gastrointestinal: Negative for abdominal pain, nausea and vomiting.  Psychiatric/Behavioral: The patient is nervous/anxious.   All other systems reviewed and are negative.    Physical Exam Updated Vital Signs BP (!) 122/57 (BP Location: Right Arm)   Pulse 65   Temp 98.4 F (36.9 C) (Oral)   Resp 20   Ht 5\' 2"  (1.575 m)   Wt 95.3 kg (210 lb)   LMP 08/03/2016   SpO2 99%   BMI 38.41 kg/m   Physical Exam  Constitutional: She is oriented to person, place, and time. She appears well-developed and well-nourished.  Anxious appearing, overweight  HENT:  Head: Normocephalic and atraumatic.  Cardiovascular: Normal rate, regular rhythm and normal heart sounds.   No murmur heard. Pulmonary/Chest: Effort normal and breath sounds normal. No respiratory distress. She has no wheezes. She exhibits tenderness.  Tenderness palpation left chest wall without crepitus  Abdominal: Soft. There is no tenderness.  Musculoskeletal: She exhibits no edema.  Neurological: She is alert and oriented to person, place, and time.  Skin: Skin is warm and dry.  Psychiatric: She has a normal mood and affect.  Nursing note and vitals reviewed.    ED Treatments / Results   DIAGNOSTIC STUDIES: Oxygen Saturation is 100% on RA, normal by my interpretation.   COORDINATION OF CARE: 12:23 AM-Discussed next steps with pt. Pt verbalized understanding and is agreeable with the plan.   Labs (all labs ordered are listed, but only abnormal results are displayed) Labs Reviewed - No data to display  EKG  EKG Interpretation  Date/Time:  Thursday August 19 2016 20:32:54 EDT Ventricular Rate:  85 PR Interval:  102 QRS Duration: 82 QT Interval:  364 QTC Calculation: 433 R Axis:   98 Text Interpretation:  Sinus rhythm with marked sinus arrhythmia with short PR Rightward axis Borderline ECG Confirmed by Thayer Jew [phone]) on 08/19/2016  11:53:30 PM       Radiology No results found.  Procedures Procedures (including critical care time)  Medications Ordered in ED Medications  naproxen (NAPROSYN) tablet 500 mg (not administered)  hydrOXYzine (ATARAX/VISTARIL) tablet 50 mg (not administered)     Initial Impression / Assessment and Plan / ED Course  I have reviewed the triage vital signs and the nursing notes.  Pertinent labs & imaging results that were available during my care of the patient were reviewed by me and considered in my medical decision making (see chart for details).     Patient presents with persistent, chronic chest pain. She is nontoxic on exam. Vital signs reassuring. EKG is unchanged. Chest x-ray reviewed from 7/7 and reassuring. Physical exam without indication for repeat imaging. She has reproducible  pain on exam. She has recently had lab work to include a d-dimer to rule out blood clots. I attempted to reassure the patient. She is very anxious about her chest pain. She is not taking anything at home for her pain. Will discharge with naproxen. Follow-up provided with cardiology.  After history, exam, and medical workup I feel the patient has been appropriately medically screened and is safe for discharge home. Pertinent diagnoses were discussed with the patient. Patient was given return precautions.   Final Clinical Impressions(s) / ED Diagnoses   Final diagnoses:  Chest wall pain    New Prescriptions New Prescriptions   NAPROXEN (NAPROSYN) 500 MG TABLET    Take 1 tablet (500 mg total) by mouth 2 (two) times daily.   I personally performed the services described in this documentation, which was scribed in my presence. The recorded information has been reviewed and is accurate.     Merryl Hacker, MD 08/20/16 [phone]

## 2016-08-19 NOTE — ED Notes (Signed)
Patient states that she believes all the nurses are talking about her.  She also believes that certain people come to the hospital and the nurses choose for that patient to die.  She thinks she might be selected to die by staff and that is why she is not being pulled back to a room immediately.

## 2016-08-19 NOTE — ED Notes (Signed)
Pt at [facility] requesting update.

## 2016-08-19 NOTE — ED Triage Notes (Signed)
Pt brought in my EMS with complaints of chest wall pain and anxiety.  Pt was here twice yesterday for same.  Ambulatory. In no acute distress.

## 2016-08-19 NOTE — Discharge Instructions (Signed)
Please keep your appointment with primary care later this month. When pain is worse with movement of her arm, you can apply ice to areas that are sore for 15-20 minutes up to 3 times a day. You can also take 800 mg of ibuprofen every 8 hours with food to help with symptoms. If you develop new or worsening symptoms, please return to the emergency department for reevaluation.

## 2016-08-20 ENCOUNTER — Emergency Department (HOSPITAL_COMMUNITY)
Admission: EM | Admit: 2016-08-20 | Discharge: 2016-08-21 | Disposition: A | Discharge status: Other Acute Inpatient Hospital | Payer: Medicaid Other | Attending: Emergency Medicine | Admitting: Emergency Medicine

## 2016-08-20 ENCOUNTER — Other Ambulatory Visit: Payer: Self-pay

## 2016-08-20 ENCOUNTER — Encounter (HOSPITAL_COMMUNITY): Payer: Self-pay

## 2016-08-20 ENCOUNTER — Emergency Department (HOSPITAL_COMMUNITY): Payer: Self-pay

## 2016-08-20 ENCOUNTER — Emergency Department (HOSPITAL_COMMUNITY)
Admission: EM | Admit: 2016-08-20 | Discharge: 2016-08-20 | Disposition: A | Discharge status: Home / Self Care | Payer: Self-pay | Attending: Emergency Medicine | Admitting: Emergency Medicine

## 2016-08-20 DIAGNOSIS — F17210 Nicotine dependence, cigarettes, uncomplicated: Secondary | ICD-10-CM | POA: Insufficient documentation

## 2016-08-20 DIAGNOSIS — J069 Acute upper respiratory infection, unspecified: Secondary | ICD-10-CM

## 2016-08-20 DIAGNOSIS — F411 Generalized anxiety disorder: Secondary | ICD-10-CM

## 2016-08-20 DIAGNOSIS — R21 Rash and other nonspecific skin eruption: Secondary | ICD-10-CM | POA: Insufficient documentation

## 2016-08-20 DIAGNOSIS — F410 Panic disorder [episodic paroxysmal anxiety] without agoraphobia: Secondary | ICD-10-CM | POA: Insufficient documentation

## 2016-08-20 DIAGNOSIS — R0789 Other chest pain: Secondary | ICD-10-CM | POA: Insufficient documentation

## 2016-08-20 DIAGNOSIS — F902 Attention-deficit hyperactivity disorder, combined type: Secondary | ICD-10-CM | POA: Insufficient documentation

## 2016-08-20 DIAGNOSIS — Z79899 Other long term (current) drug therapy: Secondary | ICD-10-CM | POA: Insufficient documentation

## 2016-08-20 LAB — ETHANOL

## 2016-08-20 LAB — URINALYSIS, ROUTINE W REFLEX MICROSCOPIC
BILIRUBIN URINE: NEGATIVE
GLUCOSE, UA: NEGATIVE mg/dL
HGB URINE DIPSTICK: NEGATIVE
KETONES UR: 5 mg/dL — AB
NITRITE: NEGATIVE
PROTEIN: 30 mg/dL — AB
Specific Gravity, Urine: 1.028 (ref 1.005–1.030)
pH: 5 (ref 5.0–8.0)

## 2016-08-20 LAB — COMPREHENSIVE METABOLIC PANEL
ALT: 59 U/L — ABNORMAL HIGH (ref 14–54)
ANION GAP: 7 (ref 5–15)
AST: 30 U/L (ref 15–41)
Albumin: 4.4 g/dL (ref 3.5–5.0)
Alkaline Phosphatase: 64 U/L (ref 38–126)
BUN: 11 mg/dL (ref 6–20)
CHLORIDE: 110 mmol/L (ref 101–111)
CO2: 22 mmol/L (ref 22–32)
CREATININE: 0.95 mg/dL (ref 0.44–1.00)
Calcium: 9.4 mg/dL (ref 8.9–10.3)
Glucose, Bld: 105 mg/dL — ABNORMAL HIGH (ref 65–99)
POTASSIUM: 3.5 mmol/L (ref 3.5–5.1)
SODIUM: 139 mmol/L (ref 135–145)
Total Bilirubin: 0.6 mg/dL (ref 0.3–1.2)
Total Protein: 6.6 g/dL (ref 6.5–8.1)

## 2016-08-20 LAB — SALICYLATE LEVEL

## 2016-08-20 LAB — RAPID URINE DRUG SCREEN, HOSP PERFORMED
AMPHETAMINES: NOT DETECTED
Barbiturates: POSITIVE — AB
Benzodiazepines: NOT DETECTED
COCAINE: NOT DETECTED
OPIATES: NOT DETECTED
Tetrahydrocannabinol: NOT DETECTED

## 2016-08-20 LAB — ACETAMINOPHEN LEVEL

## 2016-08-20 LAB — I-STAT BETA HCG BLOOD, ED (MC, WL, AP ONLY): I-stat hCG, quantitative: <5 m[IU]/mL (ref <5)

## 2016-08-20 LAB — I-STAT TROPONIN, ED: Troponin i, poc: 0 ng/mL (ref 0.00–0.08)

## 2016-08-20 LAB — CBC
HCT: 41.7 % (ref 36.0–46.0)
Hemoglobin: 14.2 g/dL (ref 12.0–15.0)
MCH: 29.6 pg (ref 26.0–34.0)
MCHC: 34.1 g/dL (ref 30.0–36.0)
MCV: 87.1 fL (ref 78.0–100.0)
PLATELETS: 294 10*3/uL (ref 150–400)
RBC: 4.79 MIL/uL (ref 3.87–5.11)
RDW: 12.4 % (ref 11.5–15.5)
WBC: 9.8 10*3/uL (ref 4.0–10.5)

## 2016-08-20 MED ORDER — ACETAMINOPHEN 325 MG PO TABS
650.0000 mg | ORAL_TABLET | ORAL | Status: DC | PRN
  Administered 2016-08-20: 650 mg via ORAL
  Filled 2016-08-20: qty 2

## 2016-08-20 MED ORDER — HYDROXYZINE HCL 50 MG PO TABS
50.0000 mg | ORAL_TABLET | Freq: Three times a day (TID) | ORAL | Status: DC | PRN
Start: 2016-08-20 — End: 2016-08-21

## 2016-08-20 MED ORDER — ZIPRASIDONE MESYLATE 20 MG IM SOLR
20.0000 mg | Freq: Once | INTRAMUSCULAR | Status: AC
  Administered 2016-08-20: 20 mg via INTRAMUSCULAR
  Filled 2016-08-20: qty 20

## 2016-08-20 MED ORDER — HYDROXYZINE HCL 25 MG PO TABS
50.0000 mg | ORAL_TABLET | Freq: Once | ORAL | Status: AC
  Administered 2016-08-20: 50 mg via ORAL
  Filled 2016-08-20: qty 2

## 2016-08-20 MED ORDER — NAPROXEN 500 MG PO TABS: 500.0000 mg | ORAL_TABLET | Freq: Two times a day (BID) | ORAL | 0 refills | Status: DC

## 2016-08-20 MED ORDER — LORATADINE 10 MG PO TABS
10.0000 mg | ORAL_TABLET | Freq: Every day | ORAL | Status: DC
  Administered 2016-08-20 – 2016-08-21 (×2): 10 mg via ORAL
  Filled 2016-08-20 (×2): qty 1

## 2016-08-20 MED ORDER — FAMOTIDINE 20 MG PO TABS
20.0000 mg | ORAL_TABLET | Freq: Two times a day (BID) | ORAL | Status: DC
  Administered 2016-08-20 – 2016-08-21 (×2): 20 mg via ORAL
  Filled 2016-08-20 (×2): qty 1

## 2016-08-20 MED ORDER — HYDROXYZINE HCL 25 MG PO TABS
25.0000 mg | ORAL_TABLET | Freq: Once | ORAL | Status: AC
  Administered 2016-08-20: 25 mg via ORAL
  Filled 2016-08-20: qty 1

## 2016-08-20 MED ORDER — CEPHALEXIN 250 MG PO CAPS
500.0000 mg | ORAL_CAPSULE | Freq: Three times a day (TID) | ORAL | Status: DC
  Administered 2016-08-20 – 2016-08-21 (×2): 500 mg via ORAL
  Filled 2016-08-20 (×2): qty 2

## 2016-08-20 MED ORDER — CLOTRIMAZOLE 1 % EX CREA
TOPICAL_CREAM | Freq: Once | CUTANEOUS | Status: AC
  Administered 2016-08-20: 23:00:00 via TOPICAL
  Filled 2016-08-20: qty 15

## 2016-08-20 MED ORDER — BUSPIRONE HCL 15 MG PO TABS
7.5000 mg | ORAL_TABLET | Freq: Two times a day (BID) | ORAL | Status: DC
  Administered 2016-08-20 – 2016-08-21 (×2): 7.5 mg via ORAL
  Filled 2016-08-20 (×3): qty 1

## 2016-08-20 MED ORDER — NAPROXEN 250 MG PO TABS: 500.0000 mg | ORAL_TABLET | Freq: Once | ORAL | Status: DC

## 2016-08-20 MED ORDER — NAPROXEN 250 MG PO TABS
500.0000 mg | ORAL_TABLET | Freq: Once | ORAL | Status: AC
  Administered 2016-08-20: 500 mg via ORAL
  Filled 2016-08-20: qty 2

## 2016-08-20 NOTE — ED Notes (Signed)
TTS at bedside. 

## 2016-08-20 NOTE — ED Notes (Addendum)
Pt very anxious and tearful. Pt grabbing her chest stating that her chest hurts and it hurts to breathe. This RN encouraged pt to relax and focus on deep breathing. Pt still pacing around hallway. MD aware.

## 2016-08-20 NOTE — ED Notes (Signed)
Pt has been changed into paper scrubs, staffing called for sitter, and pt has been wanded and cleared by security.

## 2016-08-20 NOTE — ED Notes (Signed)
pt reporting she felt as if her concerns were not addressed, Beverly Torres, Surveyor, quantity speaking with the pt re: pts concerns, pts vitals reassessed per pts request, pt vitals WNL,

## 2016-08-20 NOTE — ED Provider Notes (Signed)
[facility] DEPT Provider Note   CSN: [id number] Arrival date & time: 08/20/16  1740     History   Chief Complaint Chief Complaint  Patient presents with  . Anxiety  . IVC    HPI MEARL HAREWOOD is a 21 y.o. female.  HPI Patient was placed on involuntary commitment paperwork by the patient's mother. Patient has a history of anxiety. She has been seen in the emergency room numerous times over the past 2 months for issues with anxiety, chest pain, abdominal pain.  Patient was actually seen in the emergency room today for recurrent chest wall pain. She was treated and released. Was in addition to 2 visits in the emergency room yesterday. Patient went back home and her mother placed on involuntary commitment paperwork. She is here for evaluation regarding her recurrent anxiety psychosomatic complaints. Patient denies any suicidal or homicidal ideation Past Medical History:  Diagnosis Date  . Acid reflux   . Anxiety   . Anxiety   . Attention deficit disorder     Patient Active Problem List   Diagnosis Date Noted  . Anxiety 05/02/2012  . ADHD (attention deficit hyperactivity disorder), combined type 03/28/2012    Past Surgical History:  Procedure Laterality Date  . ADENOIDECTOMY    . TONSILLECTOMY    . WISDOM TOOTH EXTRACTION      OB History    No data available       Home Medications    Prior to Admission medications   Medication Sig Start Date End Date Taking? Authorizing Provider  busPIRone (BUSPAR) 7.5 MG tablet Take 1 tablet (7.5 mg total) by mouth 2 (two) times daily. 08/03/16   Dorie Rank, MD  cephALEXin (KEFLEX) 500 MG capsule Take 1 capsule (500 mg total) by mouth 3 (three) times daily. 08/18/16 08/25/16  Gareth Morgan, MD  cetirizine (ZYRTEC) 10 MG tablet Take 1 tablet (10 mg total) by mouth daily. Patient not taking: Reported on 02/20/2016 06/12/15   Paretta-Leahey, Haze Boyden, NP  fluticasone (FLONASE) 50 MCG/ACT nasal spray Place 1 spray into both nostrils  daily. Patient not taking: Reported on 08/03/2016 07/05/16   [name] [name] A, PA-C  hydrOXYzine (ATARAX/VISTARIL) 50 MG tablet Take 1 tablet (50 mg total) by mouth 3 (three) times daily as needed for anxiety. Patient not taking: Reported on 08/03/2016 06/12/15   Paretta-Leahey, Haze Boyden, NP  LORazepam (ATIVAN) 1 MG tablet Take 1 tablet (1 mg total) by mouth every 8 (eight) hours as needed for anxiety. 08/01/16   Antonietta Breach, PA-C  naproxen (NAPROSYN) 500 MG tablet Take 1 tablet (500 mg total) by mouth 2 (two) times daily. 08/20/16   Horton, Barbette Hair, MD  omeprazole (PRILOSEC) 20 MG capsule Take 1 capsule (20 mg total) by mouth 2 (two) times daily. 08/14/16   Tanna Furry, MD    Family History Family History  Problem Relation Age of Onset  . Anxiety disorder Mother   . Drug abuse Father   . ADD / ADHD Father   . ADD / ADHD Sister   . Anxiety disorder Sister   . ADD / ADHD Brother   . ODD Brother   . Rheum arthritis Maternal Grandmother   . Kidney failure Maternal Grandmother   . Hypertension Maternal Grandfather   . Diabetes Maternal Grandfather   . Arthritis Maternal Grandfather   . Cancer Maternal Grandfather        Melanoma  . Diabetes Paternal Grandfather     Social History Social History  Substance Use  Topics  . Smoking status: Current Every Day Smoker    Types: Cigarettes  . Smokeless tobacco: Never Used  . Alcohol use No     Allergies   Patient has no known allergies.   Review of Systems Review of Systems  All other systems reviewed and are negative.    Physical Exam Updated Vital Signs BP (!) 109/49 (BP Location: Right Arm)   Pulse 68   Temp 98.6 F (37 C) (Oral)   Resp 18   LMP 08/03/2016 (Exact Date)   SpO2 100%   Physical Exam  Constitutional: She appears well-developed and well-nourished. No distress.  HENT:  Head: Normocephalic and atraumatic.  Right Ear: External ear normal.  Left Ear: External ear normal.  Eyes: Conjunctivae are normal. Right  eye exhibits no discharge. Left eye exhibits no discharge. No scleral icterus.  Neck: Neck supple. No tracheal deviation present.  Cardiovascular: Normal rate, regular rhythm and intact distal pulses.   Pulmonary/Chest: Effort normal and breath sounds normal. No stridor. No respiratory distress. She has no wheezes. She has no rales.  Abdominal: Soft. Bowel sounds are normal. She exhibits no distension. There is no tenderness. There is no rebound and no guarding.  Musculoskeletal: She exhibits no edema or tenderness.  Neurological: She is alert. She has normal strength. No cranial nerve deficit (no facial droop, extraocular movements intact, no slurred speech) or sensory deficit. She exhibits normal muscle tone. She displays no seizure activity. Coordination normal.  Skin: Skin is warm and dry. No rash noted.  Psychiatric: Her mood appears anxious. Her speech is not slurred. She is agitated. She is not combative. She expresses impulsivity and inappropriate judgment. She does not exhibit a depressed mood. She expresses no homicidal and no suicidal ideation. She is communicative.  Nursing note and vitals reviewed.    ED Treatments / Results  Labs (all labs ordered are listed, but only abnormal results are displayed) Labs Reviewed  COMPREHENSIVE METABOLIC PANEL - Abnormal; Notable for the following:       Result Value   Glucose, Bld 105 (*)    ALT 59 (*)    All other components within normal limits  ACETAMINOPHEN LEVEL - Abnormal; Notable for the following:    Acetaminophen (Tylenol), Serum <10 (*)    All other components within normal limits  RAPID URINE DRUG SCREEN, HOSP PERFORMED - Abnormal; Notable for the following:    Barbiturates POSITIVE (*)    All other components within normal limits  ETHANOL  SALICYLATE LEVEL  CBC  I-STAT BETA HCG BLOOD, ED ([facility], [facility], [facility] ONLY)     Radiology Dg Chest 2 View  Result Date: 08/20/2016 CLINICAL DATA:  Chest pain. EXAM: CHEST  2 VIEW COMPARISON:   Radiographs of August 14, 2016. FINDINGS: The heart size and mediastinal contours are within normal limits. Both lungs are clear. No pneumothorax or pleural effusion is noted. The visualized skeletal structures are unremarkable. IMPRESSION: No active cardiopulmonary disease. Electronically Signed   By: Marijo Conception, M.D.   On: 08/20/2016 12:22    Procedures Procedures (including critical care time)  Medications Ordered in ED Medications  hydrOXYzine (ATARAX/VISTARIL) tablet 25 mg (not administered)  acetaminophen (TYLENOL) tablet 650 mg (not administered)  busPIRone (BUSPAR) tablet 7.5 mg (not administered)  cephALEXin (KEFLEX) capsule 500 mg (not administered)  loratadine (CLARITIN) tablet 10 mg (not administered)  hydrOXYzine (ATARAX/VISTARIL) tablet 50 mg (not administered)  famotidine (PEPCID) tablet 20 mg (not administered)     Initial Impression / Assessment  and Plan / ED Course  I have reviewed the triage vital signs and the nursing notes.  Pertinent labs & imaging results that were available during my care of the patient were reviewed by me and considered in my medical decision making (see chart for details).   patient has been having recurrent issues with various psychosomatic complaints. She has been reassured multiple times that we are not finding any signs of any worrisome condition. Patient appears medically stable. I will consult with psychiatry.  Final Clinical Impressions(s) / ED Diagnoses   Final diagnoses:  Panic attack  Anxiety state      Dorie Rank, MD 08/20/16 2046
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
## 2016-08-20 NOTE — ED Notes (Signed)
Belongings placed in locker #3.  

## 2016-08-20 NOTE — ED Notes (Signed)
ED Provider at bedside. 

## 2016-08-20 NOTE — ED Notes (Signed)
Pt given copy of medical clearance policy sheet. Once handed to pt, pt immediately crumpled it up and threw in trash. Pt still demanding to talk to doctor. Pt stated that she wants to look at her results and make sure her labs are ok for herself.

## 2016-08-20 NOTE — ED Notes (Addendum)
Pt pacing back and forth in [facility] hallway. Pt states "I just need the doctor to come back here and see me and tell me if my results are OK so I can leave. I'm not staying in this shit hole you guys have back here." Pt not re-directable.

## 2016-08-20 NOTE — ED Notes (Signed)
Pt on the phone with mom. Requesting mom bring something for her to eat. Pt stated that her mom said she couldn't.

## 2016-08-20 NOTE — ED Notes (Signed)
Pt states she is waiting for her ride

## 2016-08-20 NOTE — Discharge Instructions (Signed)
you were seen today for chest pain.  You have had multiple visits for chest pain and a full workup that has been reassuring.  It appears you have reproducible chest wall pain. Take naproxen as needed for pain. If you continue to have symptoms or other concerns, you can follow-up with cardiology for specialist evaluation.

## 2016-08-20 NOTE — ED Notes (Signed)
Pt in triage yelling and cussing, demanding to see a doctor stating that she needs her results and she has an infection. Pt states she is anxious and she is leaving to go smoke. Informed patient she is IVC, pt states she wants to leave, pt pacing all around triage area, unable to get patient to sit down.

## 2016-08-20 NOTE — ED Provider Notes (Signed)
[facility] DEPT Provider Note   CSN: [id number] Arrival date & time: 08/20/16  1144     History   Chief Complaint Chief Complaint  Patient presents with  . Chest Pain    HPI [name] is a 21 y.o. female presenting with continued chest wall pain on the left which moves over her left shoulder and her entire left back. She reports that she hasn't tried anything for the pain and her mom has just picked up her naproxen and Keflex for UTI from a week ago. She reports continued dysuria with burning on urination and suprapubic discomfort when she urinates. She also reports an episode of watery/loose stool this morning. She denies fever but reports chills.  HPI  Past Medical History:  Diagnosis Date  . Acid reflux   . Anxiety   . Anxiety   . Attention deficit disorder     Patient Active Problem List   Diagnosis Date Noted  . Anxiety 05/02/2012  . ADHD (attention deficit hyperactivity disorder), combined type 03/28/2012    Past Surgical History:  Procedure Laterality Date  . ADENOIDECTOMY    . TONSILLECTOMY    . WISDOM TOOTH EXTRACTION      OB History    No data available       Home Medications    Prior to Admission medications   Medication Sig Start Date End Date Taking? Authorizing Provider  busPIRone (BUSPAR) 7.5 MG tablet Take 1 tablet (7.5 mg total) by mouth 2 (two) times daily. 08/03/16   Dorie Rank, MD  cephALEXin (KEFLEX) 500 MG capsule Take 1 capsule (500 mg total) by mouth 3 (three) times daily. 08/18/16 08/25/16  Gareth Morgan, MD  cetirizine (ZYRTEC) 10 MG tablet Take 1 tablet (10 mg total) by mouth daily. Patient not taking: Reported on 02/20/2016 06/12/15   Paretta-Leahey, Haze Boyden, NP  fluticasone (FLONASE) 50 MCG/ACT nasal spray Place 1 spray into both nostrils daily. Patient not taking: Reported on 08/03/2016 07/05/16   [name] [name] A, PA-C  hydrOXYzine (ATARAX/VISTARIL) 50 MG tablet Take 1 tablet (50 mg total) by mouth 3 (three) times daily as  needed for anxiety. Patient not taking: Reported on 08/03/2016 06/12/15   Paretta-Leahey, Haze Boyden, NP  LORazepam (ATIVAN) 1 MG tablet Take 1 tablet (1 mg total) by mouth every 8 (eight) hours as needed for anxiety. 08/01/16   Antonietta Breach, PA-C  naproxen (NAPROSYN) 500 MG tablet Take 1 tablet (500 mg total) by mouth 2 (two) times daily. 08/20/16   Horton, Barbette Hair, MD  omeprazole (PRILOSEC) 20 MG capsule Take 1 capsule (20 mg total) by mouth 2 (two) times daily. 08/14/16   Tanna Furry, MD    Family History Family History  Problem Relation Age of Onset  . Anxiety disorder Mother   . Drug abuse Father   . ADD / ADHD Father   . ADD / ADHD Sister   . Anxiety disorder Sister   . ADD / ADHD Brother   . ODD Brother   . Rheum arthritis Maternal Grandmother   . Kidney failure Maternal Grandmother   . Hypertension Maternal Grandfather   . Diabetes Maternal Grandfather   . Arthritis Maternal Grandfather   . Cancer Maternal Grandfather        Melanoma  . Diabetes Paternal Grandfather     Social History Social History  Substance Use Topics  . Smoking status: Current Every Day Smoker    Types: Cigarettes  . Smokeless tobacco: Never Used  . Alcohol use No  Allergies   Patient has no known allergies.   Review of Systems Review of Systems  Constitutional: Negative for chills and fever.  HENT: Positive for congestion. Negative for ear pain and sore throat.   Eyes: Negative for pain and visual disturbance.  Respiratory: Positive for cough and shortness of breath. Negative for wheezing and stridor.   Cardiovascular: Positive for chest pain. Negative for palpitations and leg swelling.  Gastrointestinal: Positive for diarrhea. Negative for abdominal distention, abdominal pain, blood in stool and vomiting.       Non-bloody loose stool this am  Genitourinary: Positive for dysuria and frequency. Negative for flank pain and hematuria.  Musculoskeletal: Positive for back pain. Negative for  arthralgias, joint swelling, myalgias, neck pain and neck stiffness.  Skin: Negative for color change, pallor and rash.  Neurological: Negative for seizures and syncope.     Physical Exam Updated Vital Signs BP (!) 109/56 (BP Location: Left Arm)   Pulse 64   Temp 98.3 F (36.8 C) (Oral)   Resp 18   LMP 08/03/2016 (Exact Date)   SpO2 100%   Physical Exam  Constitutional: She appears well-developed and well-nourished. No distress.  Afebrile, nontoxic-appearing, sitting comfortably in bed in no acute distress.  HENT:  Head: Normocephalic and atraumatic.  Eyes: Conjunctivae and EOM are normal. Right eye exhibits no discharge. Left eye exhibits no discharge. No scleral icterus.  Neck: Normal range of motion. Neck supple.  Cardiovascular: Normal rate, regular rhythm and normal heart sounds.  Exam reveals no gallop and no friction rub.   No murmur heard. Pulmonary/Chest: Effort normal and breath sounds normal. No respiratory distress. She has no wheezes. She has no rales. She exhibits tenderness.  Abdominal: Soft. She exhibits no distension and no mass. There is no tenderness. There is no rebound and no guarding.  Musculoskeletal: Normal range of motion. She exhibits tenderness. She exhibits no edema or deformity.  Patient reports tenderness along the entire left back musculature, shoulder and left chest  Neurological: She is alert.  Skin: Skin is warm and dry. Rash noted. She is not diaphoretic. No erythema. No pallor.  Patient has small papules on her chest bilaterally no vesicles, nontender.  Psychiatric: She has a normal mood and affect.  Nursing note and vitals reviewed.    ED Treatments / Results  Labs (all labs ordered are listed, but only abnormal results are displayed) Labs Reviewed  URINALYSIS, ROUTINE W REFLEX MICROSCOPIC - Abnormal; Notable for the following:       Result Value   APPearance CLOUDY (*)    Ketones, ur 5 (*)    Protein, ur 30 (*)    Leukocytes, UA  MODERATE (*)    Bacteria, UA RARE (*)    Squamous Epithelial / LPF 6-30 (*)    All other components within normal limits  I-STAT TROPOININ, ED    EKG  EKG Interpretation None     EKG reviewed with Dr. Johnney Killian. Did not transfer over on EPIC  Radiology Dg Chest 2 View  Result Date: 08/20/2016 CLINICAL DATA:  Chest pain. EXAM: CHEST  2 VIEW COMPARISON:  Radiographs of August 14, 2016. FINDINGS: The heart size and mediastinal contours are within normal limits. Both lungs are clear. No pneumothorax or pleural effusion is noted. The visualized skeletal structures are unremarkable. IMPRESSION: No active cardiopulmonary disease. Electronically Signed   By: Marijo Conception, M.D.   On: 08/20/2016 12:22    Procedures Procedures (including critical care time)  Medications Ordered in ED  Medications - No data to display   Initial Impression / Assessment and Plan / ED Course  I have reviewed the triage vital signs and the nursing notes.  Pertinent labs & imaging results that were available during my care of the patient were reviewed by me and considered in my medical decision making (see chart for details).    Patient presenting with continued complaint of chest wall pain. She was seen multiple times for the same with negative workup including dimer 2 days ago, EKG, and chest x-ray. Patient was seen with complaints of chest pain or tightness 5 times over the last week and discharged with negative workup.  Today her chest x-ray negative, EKG is unchanged and troponin negative I do not suspect ACS as etiology of patient's symptoms or PE. PERC negative  She reported productive cough. Reassuring exam, lungs are clear to auscultation bilaterally, no wheezing. She is afebrile, nontoxic-appearing.  Chest pain is reproducible on palpation along the sternal border bilaterally. Bilateral chest papules are more consistent with acne than zoster.   Advised patient to take her naproxen for symptoms and  follow up with primary care. Also advised to take her antibiotic for UTI which was prescribed to her overall week ago. Advised to stay well- hydrated and to follow up with PCP if her loose stools persist, she develops a fever or new concerning symptoms in the meantime. Abdomen is soft and non-tender.   Patient is well-appearing and stable for discharge. She appears anxious about her health and was temporarily reassured about her negative troponin and EKG. Attempted to explain everything and answer questions and urged patient to follow up with her primary care provider for any further ongoing chronic conditions.  Discussed strict return precautions and advised to return to the emergency department if experiencing any new or worsening symptoms. Instructions were understood and patient agreed with discharge plan.  Final Clinical Impressions(s) / ED Diagnoses   Final diagnoses:  Chest wall pain    New Prescriptions Discharge Medication List as of 08/20/2016  2:39 PM       Emeline General, PA-C 08/20/16 2124    Charlesetta Shanks, MD 08/26/16 1701

## 2016-08-20 NOTE — Discharge Instructions (Signed)
As discussed, take the naproxen that was prescribed to you which will help with inflammation and pain of your chest wall and back. Make sure that you take the antibiotic that was prescribed for your UTI a week ago in its entirety even if you feel better.  Stay well hydrated drinking enough water to keep your urine clear. Follow-up with your primary care provider as soon as possible.  Return if symptoms worsen or you experience new concerning symptoms in the meantime.

## 2016-08-20 NOTE — ED Notes (Signed)
Pt left holding discharge papers and reports "I've got my papers and I'm leaving".  Pt reports she had a bowel movement and was wanting the PA-C to see it because it was diarrhea.  Pt states "I think there was blood in it" but reports she only glanced at it and wasn't sure.  PA notified but pt left before PA could speak to pt again.

## 2016-08-20 NOTE — ED Triage Notes (Signed)
Pt presents with continued mid-sternal chest pain that radiates to L chest and L scapula.  Pt reports she has been seen here x 2 and discharged with chest wall pain.  Pt reports today, the pain worsened, was not doing anything in particular.  +shortness of breath, pt reports productive cough with white and brown phlegm.

## 2016-08-20 NOTE — ED Notes (Addendum)
Pt concerned about a rash in groin area. Pt reports that it is itchy and raw on both sides. MD aware.

## 2016-08-20 NOTE — ED Notes (Signed)
Pt given turkey sandwich

## 2016-08-20 NOTE — ED Notes (Signed)
Pt has been here multiple times this week for similar complaints of chest pain and anxiety. Pt arrives very anxious and states she wants to be sure there is nothing wrong with her and that she wont die today. Today her complaint is upper abdominal pain, headache and feeling weak. According to police, the patients mother is taking out IVC paperwork on her right now. Pt denies SI/HI she is just convinced something is wrong with her. Pt will not stay seated in the triage chair, appearance is disheveled and is not bathing appropriately because she is afraid of being in the shower alone "I just get lightheaded." Pt is ambulatory independently.

## 2016-08-21 ENCOUNTER — Inpatient Hospital Stay (HOSPITAL_COMMUNITY)
Admission: AD | Admit: 2016-08-21 | Discharge: 2016-08-27 | DRG: 885 | Disposition: A | Discharge status: Home / Self Care | Payer: Federal, State, Local not specified - Other | Attending: Psychiatry | Admitting: Psychiatry

## 2016-08-21 ENCOUNTER — Encounter (HOSPITAL_COMMUNITY): Payer: Self-pay

## 2016-08-21 DIAGNOSIS — Z813 Family history of other psychoactive substance abuse and dependence: Secondary | ICD-10-CM | POA: Diagnosis not present

## 2016-08-21 DIAGNOSIS — F902 Attention-deficit hyperactivity disorder, combined type: Secondary | ICD-10-CM | POA: Diagnosis present

## 2016-08-21 DIAGNOSIS — J029 Acute pharyngitis, unspecified: Secondary | ICD-10-CM | POA: Diagnosis present

## 2016-08-21 DIAGNOSIS — R1031 Right lower quadrant pain: Secondary | ICD-10-CM | POA: Diagnosis present

## 2016-08-21 DIAGNOSIS — K219 Gastro-esophageal reflux disease without esophagitis: Secondary | ICD-10-CM | POA: Diagnosis present

## 2016-08-21 DIAGNOSIS — F411 Generalized anxiety disorder: Secondary | ICD-10-CM | POA: Diagnosis not present

## 2016-08-21 DIAGNOSIS — Z840 Family history of diseases of the skin and subcutaneous tissue: Secondary | ICD-10-CM | POA: Diagnosis not present

## 2016-08-21 DIAGNOSIS — G4700 Insomnia, unspecified: Secondary | ICD-10-CM | POA: Diagnosis present

## 2016-08-21 DIAGNOSIS — Z91018 Allergy to other foods: Secondary | ICD-10-CM

## 2016-08-21 DIAGNOSIS — Z833 Family history of diabetes mellitus: Secondary | ICD-10-CM | POA: Diagnosis not present

## 2016-08-21 DIAGNOSIS — M25551 Pain in right hip: Secondary | ICD-10-CM | POA: Diagnosis present

## 2016-08-21 DIAGNOSIS — Z841 Family history of disorders of kidney and ureter: Secondary | ICD-10-CM

## 2016-08-21 DIAGNOSIS — B369 Superficial mycosis, unspecified: Secondary | ICD-10-CM | POA: Diagnosis present

## 2016-08-21 DIAGNOSIS — F419 Anxiety disorder, unspecified: Secondary | ICD-10-CM | POA: Diagnosis present

## 2016-08-21 DIAGNOSIS — F410 Panic disorder [episodic paroxysmal anxiety] without agoraphobia: Secondary | ICD-10-CM | POA: Diagnosis not present

## 2016-08-21 DIAGNOSIS — Z818 Family history of other mental and behavioral disorders: Secondary | ICD-10-CM | POA: Diagnosis not present

## 2016-08-21 DIAGNOSIS — F17210 Nicotine dependence, cigarettes, uncomplicated: Secondary | ICD-10-CM | POA: Diagnosis not present

## 2016-08-21 DIAGNOSIS — Z791 Long term (current) use of non-steroidal anti-inflammatories (NSAID): Secondary | ICD-10-CM | POA: Diagnosis not present

## 2016-08-21 DIAGNOSIS — Z8261 Family history of arthritis: Secondary | ICD-10-CM

## 2016-08-21 DIAGNOSIS — Z8249 Family history of ischemic heart disease and other diseases of the circulatory system: Secondary | ICD-10-CM | POA: Diagnosis not present

## 2016-08-21 DIAGNOSIS — Z6837 Body mass index (BMI) 37.0-37.9, adult: Secondary | ICD-10-CM

## 2016-08-21 DIAGNOSIS — Z79899 Other long term (current) drug therapy: Secondary | ICD-10-CM | POA: Diagnosis not present

## 2016-08-21 DIAGNOSIS — F203 Undifferentiated schizophrenia: Principal | ICD-10-CM | POA: Diagnosis present

## 2016-08-21 DIAGNOSIS — J45909 Unspecified asthma, uncomplicated: Secondary | ICD-10-CM | POA: Diagnosis present

## 2016-08-21 DIAGNOSIS — F459 Somatoform disorder, unspecified: Secondary | ICD-10-CM | POA: Diagnosis present

## 2016-08-21 DIAGNOSIS — F1310 Sedative, hypnotic or anxiolytic abuse, uncomplicated: Secondary | ICD-10-CM | POA: Diagnosis present

## 2016-08-21 DIAGNOSIS — R0789 Other chest pain: Secondary | ICD-10-CM | POA: Diagnosis present

## 2016-08-21 DIAGNOSIS — F329 Major depressive disorder, single episode, unspecified: Secondary | ICD-10-CM | POA: Diagnosis present

## 2016-08-21 LAB — GLUCOSE, CAPILLARY: GLUCOSE-CAPILLARY: 116 mg/dL — AB (ref 65–99)

## 2016-08-21 MED ORDER — AEROCHAMBER PLUS FLO-VU LARGE MISC
1.0000 | Freq: Once | Status: DC
  Filled 2016-08-21: qty 1

## 2016-08-21 MED ORDER — CEPHALEXIN 500 MG PO CAPS
500.0000 mg | ORAL_CAPSULE | Freq: Three times a day (TID) | ORAL | Status: DC
  Administered 2016-08-21 – 2016-08-27 (×15): 500 mg via ORAL
  Filled 2016-08-21 (×24): qty 1

## 2016-08-21 MED ORDER — ALBUTEROL SULFATE HFA 108 (90 BASE) MCG/ACT IN AERS
2.0000 | INHALATION_SPRAY | RESPIRATORY_TRACT | Status: DC | PRN
  Administered 2016-08-21: 2 via RESPIRATORY_TRACT
  Filled 2016-08-21: qty 6.7

## 2016-08-21 MED ORDER — ALUM & MAG HYDROXIDE-SIMETH 200-200-20 MG/5ML PO SUSP
30.0000 mL | ORAL | Status: DC | PRN
  Administered 2016-08-23: 30 mL via ORAL
  Filled 2016-08-21: qty 30

## 2016-08-21 MED ORDER — NYSTATIN 100000 UNIT/GM EX CREA
TOPICAL_CREAM | Freq: Two times a day (BID) | CUTANEOUS | Status: DC
  Administered 2016-08-21 – 2016-08-24 (×3): via TOPICAL
  Filled 2016-08-21: qty 15

## 2016-08-21 MED ORDER — LORATADINE 10 MG PO TABS
10.0000 mg | ORAL_TABLET | Freq: Every day | ORAL | Status: DC
  Administered 2016-08-22 – 2016-08-27 (×6): 10 mg via ORAL
  Filled 2016-08-21 (×8): qty 1

## 2016-08-21 MED ORDER — ACETAMINOPHEN 325 MG PO TABS
650.0000 mg | ORAL_TABLET | ORAL | Status: DC | PRN
  Administered 2016-08-21 – 2016-08-22 (×2): 650 mg via ORAL
  Filled 2016-08-21 (×2): qty 2

## 2016-08-21 MED ORDER — HYDROXYZINE HCL 50 MG PO TABS
50.0000 mg | ORAL_TABLET | Freq: Three times a day (TID) | ORAL | Status: DC | PRN
  Administered 2016-08-21 – 2016-08-26 (×10): 50 mg via ORAL
  Filled 2016-08-21: qty 10
  Filled 2016-08-21 (×10): qty 1

## 2016-08-21 MED ORDER — TRAZODONE HCL 50 MG PO TABS
50.0000 mg | ORAL_TABLET | Freq: Every evening | ORAL | Status: DC | PRN
  Administered 2016-08-21 – 2016-08-26 (×6): 50 mg via ORAL
  Filled 2016-08-21 (×3): qty 1
  Filled 2016-08-21: qty 7
  Filled 2016-08-21 (×4): qty 1

## 2016-08-21 MED ORDER — FLUTICASONE PROPIONATE 50 MCG/ACT NA SUSP
2.0000 | Freq: Every day | NASAL | Status: DC
  Administered 2016-08-22 – 2016-08-25 (×4): 2 via NASAL
  Filled 2016-08-21: qty 16

## 2016-08-21 MED ORDER — BUSPIRONE HCL 15 MG PO TABS
7.5000 mg | ORAL_TABLET | Freq: Two times a day (BID) | ORAL | Status: DC
  Administered 2016-08-22: 7.5 mg via ORAL
  Filled 2016-08-21 (×6): qty 1

## 2016-08-21 MED ORDER — LORAZEPAM 0.5 MG PO TABS
0.5000 mg | ORAL_TABLET | Freq: Four times a day (QID) | ORAL | Status: DC | PRN
  Administered 2016-08-21 – 2016-08-26 (×9): 0.5 mg via ORAL
  Filled 2016-08-21 (×10): qty 1

## 2016-08-21 MED ORDER — FLUTICASONE PROPIONATE 50 MCG/ACT NA SUSP
2.0000 | Freq: Every day | NASAL | Status: DC
  Administered 2016-08-21: 2 via NASAL
  Filled 2016-08-21: qty 16

## 2016-08-21 MED ORDER — MAGNESIUM HYDROXIDE 400 MG/5ML PO SUSP
30.0000 mL | Freq: Every day | ORAL | Status: DC | PRN
  Administered 2016-08-23: 30 mL via ORAL
  Filled 2016-08-21: qty 30

## 2016-08-21 MED ORDER — FAMOTIDINE 20 MG PO TABS
20.0000 mg | ORAL_TABLET | Freq: Two times a day (BID) | ORAL | Status: DC
  Administered 2016-08-21 – 2016-08-27 (×12): 20 mg via ORAL
  Filled 2016-08-21 (×17): qty 1

## 2016-08-21 MED ORDER — ALBUTEROL SULFATE HFA 108 (90 BASE) MCG/ACT IN AERS
2.0000 | INHALATION_SPRAY | RESPIRATORY_TRACT | Status: DC | PRN
  Administered 2016-08-21 – 2016-08-26 (×9): 2 via RESPIRATORY_TRACT
  Filled 2016-08-21: qty 6.7

## 2016-08-21 MED ORDER — IBUPROFEN 800 MG PO TABS
800.0000 mg | ORAL_TABLET | Freq: Once | ORAL | Status: AC
  Administered 2016-08-21: 800 mg via ORAL
  Filled 2016-08-21: qty 1

## 2016-08-21 NOTE — ED Notes (Signed)
Pt continually coming to nurse's station asking questions. Pt very anxious. Pt pacing back and forth in hallway. Pt requested to see the MD multiple times. Pt requested to have her VS taken. Pt requesting to have her heart listened to. Pt has multiple questions and concerns about her health. MD aware. This RN answering pt's questions and reassuring her that her results are OK. This RN had a therapeutic discussion with pt in attempt to calm her down.

## 2016-08-21 NOTE — ED Notes (Signed)
Pt took Geodon with moderate difficulty. Pt continued to back away from needle, causing the initial stick to be unsuccessful. Pt had to be stuck a second time to complete administration. Sitter assistance was necessary during administration for consolation.

## 2016-08-21 NOTE — ED Notes (Addendum)
Pt coming up to this RN cussing and being loud in the hallway. Pt standing by Pod F doors looking out of the doors waiting for a doctor to pass by. Security notified and called to [facility]. Pt reports that "if I die back here my mother is going to sue the fuck out of you." Pt concerned that she is going to die back here. Security standing by currently. Pt verbally aggressive and upset.

## 2016-08-21 NOTE — ED Notes (Signed)
Pt eating sandwich given.  

## 2016-08-21 NOTE — ED Notes (Signed)
Pt's valuables inventoried and placed in locker #3. Pt's cellphone sent to safe - envelope [id number].  Witnessed by [place], RN.

## 2016-08-21 NOTE — Progress Notes (Signed)
Patient is a 21 year old female, admitted IVC via [place]. Patient has been frequently calling EMS and 911, stating she is have chest pain. Patient has been worked up with multiple chest X-rays, EKGs and other tests. Results WDL. Patient was IVC'd by her mother who is concerned about her behavior. Patient denies SI/HI/AVH. She does state her pain level to be 9/10, in her chest. VS WDL. No diaphoresis, or other cardiac symptoms noted.  On skin assessment multiple tattoos noted to arms and legs. Patient has odorous rash to groin, currently being treated with antifungal creme. Patient also noted to have significant acne outbreak to chest and buttocks. One, small open wound to left chest, appears to be an infected pimple.  Piercing noted to right nose and belly button. Paperwork completed. Personal belongings secured in locker. Oriented to unit. Q 15 minute checks initiated

## 2016-08-21 NOTE — BHH Counselor (Signed)
Writer to do reassessment but per [employer], pt is sleeping. Pt is sleeping very little, so writer will postpone reassessment. RN will call TTS when pt awake and able to participate in reassessment.   Arnold Long, [facility] Therapeutic Triage Specialist

## 2016-08-21 NOTE — ED Notes (Signed)
Pt accepted to [facility] 403-2 - Dr Parke Poisson.

## 2016-08-21 NOTE — Tx Team (Signed)
Initial Treatment Plan 08/21/2016 3:51 PM Beverly Torres [facility] BHA:[id number]    PATIENT STRESSORS: Financial difficulties Health problems   PATIENT STRENGTHS: Communication skills   PATIENT IDENTIFIED PROBLEMS: "Anxiety"  "Better more restful sleep"                   DISCHARGE CRITERIA:  Ability to meet basic life and health needs Medical problems require only outpatient monitoring  PRELIMINARY DISCHARGE PLAN: Outpatient therapy  PATIENT/FAMILY INVOLVEMENT: This treatment plan has been presented to and reviewed with the patient, Beverly Torres.  The patient has been given the opportunity to ask questions and make suggestions.  Cheri Kearns, RN 08/21/2016, 3:51 PM

## 2016-08-21 NOTE — ED Provider Notes (Signed)
  Beverly Torres is a 21 y.o. female under IVC by her mother with a history of anxiety. Patient has been seen numerous times the emergency department for this. Patient seen earlier in the evening by Dr. Tomi Bamberger and medically cleared. I was called to evaluate the patient secondary to her anxiety about numerous symptoms. She reports chest tightness, shortness of breath, nasal congestion, feeling sweaty, headache.  She states this is not anxiety. She does endorse cough, congestion and additional URI symptoms. No treatment of this prior to arrival.  Patient reports she is an everyday smoker.   Physical Exam  BP (!) 107/58 (BP Location: Right Arm)   Pulse 62   Temp 98.4 F (36.9 C) (Oral)   Resp 18   LMP 08/03/2016 (Exact Date)   SpO2 100%   Physical Exam  Constitutional: She appears well-developed and well-nourished. No distress.  HENT:  Head: Normocephalic.  Eyes: Conjunctivae are normal. No scleral icterus.  Neck: Normal range of motion.  Cardiovascular: Normal rate and intact distal pulses.   Pulmonary/Chest: Effort normal. No tachypnea. She has wheezes ( Fine, expiratory).  Musculoskeletal: Normal range of motion.  Neurological: She is alert.  Skin: Skin is warm and dry.  Psychiatric: Her mood appears anxious.  Nursing note and vitals reviewed.    ED Course  Procedures  Panic attack  Anxiety state  Upper respiratory tract infection, unspecified type   MDM Patient with symptoms of URI. She is clearly very anxious. Her physical exam is reassuring. She is without tachycardia. She is afebrile. She has no hypoxia.  Patient will be given albuterol inhaler, Flonase and acetaminophen.  No evidence of meningitis. Patient without cardiac history. She is young and otherwise healthy.   7:38 AM At shift change, ECG is pending.  Dr. Tamera Punt was notified and will follow-up on ECG.  Doubt ACS.       Beverly Torres, [name] 08/21/16 [place], MD 08/21/16 [phone]

## 2016-08-21 NOTE — Progress Notes (Addendum)
Patient came back from dinner and stated she didn't feel well and was feeling dizzy. Patient instructed to sit in front of nurses station, for staff observation and patient given fluids to drink. Patient educated about not getting up and moving if she was still dizzy, or to notify staff, to allow assist, if needed. Nurse left momentarily and when she came to check on patient, patient reported, "I went to my room' to the bathroom. I just fell in the bathroom. I passed out." Patient stated, "I hit my head", pointing to her left forehead area. No redness, wound, or swelling noted. Patient also reports that her neck hurts. No swelling, redness, heat. ROM WDL. Neuro checks WDL, pushes/grips, WDL. VS WDL. MD notified and order received to continue to observe patient and to continue to encourage fluids. Denies CP at this time. Will continue to assess for any effects from the fall.

## 2016-08-21 NOTE — ED Notes (Signed)
[place] so may reach [employer] d/t IVC paperwork needs to be completed by deputy "section B".

## 2016-08-21 NOTE — Progress Notes (Signed)
Pending admission at [facility], per [facility].   Patient has been recommended psychiatric inpatient treatment per Lindon Romp NP, on 08/20/16.  Patient has been referred for IP treatment at the following facilities: [facility], [facility], [place], and [facility].  At capacity today: [facility], [facility], [place], [place], [place], [place], and [facility], [place], [place], [place] - are not placement options for patient due to pt has medicaid.  CSW in disposition will continue to seek placement for patient.  Verlon Setting, [facility] Disposition staff 08/21/2016 8:57 AM

## 2016-08-21 NOTE — ED Notes (Signed)
Documentation of Section B on IVC papers completed by [facility]. Deputy called for [facility] to transport pt. Dr Tamera Punt advised will complete EMTALA shortly.

## 2016-08-21 NOTE — ED Notes (Signed)
PA [place] speaking with patient currently. Pt has c/o her hands being "splotchy" and that she has been waiting here for hours waiting to speak with the doctor. Pt mentioned chest pain and some shortness of breath.

## 2016-08-21 NOTE — ED Notes (Signed)
Pt aware accepted to [facility] - in agreement w/tx plan - on phone now w/her mother advising of plan.

## 2016-08-21 NOTE — ED Notes (Signed)
Pt's IVC paperwork faxed to [facility]. Copy of IVC paperwork placed in medical records. Original IVC paperwork placed in red folder for magistrate.

## 2016-08-21 NOTE — ED Notes (Signed)
Pt stopped pacing and went into room and shut lights off. Pt currently standing in room watching tv and eating crackers.

## 2016-08-21 NOTE — Progress Notes (Addendum)
Patient began complaining of, "Really bad chest pain", this afternoon. Patient was clutching her chest and bending over. VS taken and WDL. No diaphoresis, or other objective cardiac symptoms noted.NP notified and EKGs from last visits reviewed, as were chest X-rays. Patient placed on chair in front of the nurses station for closer observation. Patient given tylenol and vistaril and a hot pack.  Patient then began crying and pacing in the halls. MD notified and MD assessed patient.  New order for ativan given. Patient visibly calmed. Continue to assess.

## 2016-08-21 NOTE — ED Notes (Signed)
Pt asleep with sitter at bedside. Respirations even & unlabored.

## 2016-08-21 NOTE — ED Provider Notes (Signed)
ED ECG REPORT   Date: 08/21/2016  Rate: 66  Rhythm: normal sinus rhythm  QRS Axis: normal  Intervals: normal  ST/T Wave abnormalities: nonspecific ST/T changes  Conduction Disutrbances:none  Narrative Interpretation:   Old EKG Reviewed: changes noted, T wave flattening, otherwise unchanged  I have personally reviewed the EKG tracing and agree with the computerized printout as noted.    Malvin Johns, MD 08/21/16 [phone]

## 2016-08-21 NOTE — ED Provider Notes (Signed)
The patient has been accepted by [facility], Dr. Parke Poisson. Patient will be transferred.   Fredia Sorrow, MD 08/21/16 1150

## 2016-08-21 NOTE — ED Notes (Addendum)
Pt woke up from her sleep with wet eyes and reports that she has a headache, feels sweaty, a little chest pain and her vision is blurry. Requesting to see a doctor. Pt stuck her head out of Pod F and questioned Pod A tech if there was a doctor. Pt stating "nobody in this hospital fucking cares. Something is wrong with me and it's not anxiety. I'm sick of people saying it's anxiety." Pt pacing in the hallways again. This RN told pt that she would notify EDP when she returned to her room. PA [place] aware.

## 2016-08-22 DIAGNOSIS — F410 Panic disorder [episodic paroxysmal anxiety] without agoraphobia: Secondary | ICD-10-CM

## 2016-08-22 MED ORDER — NICOTINE POLACRILEX 2 MG MT GUM
2.0000 mg | CHEWING_GUM | OROMUCOSAL | Status: DC | PRN
  Administered 2016-08-22 – 2016-08-27 (×7): 2 mg via ORAL
  Filled 2016-08-22: qty 1

## 2016-08-22 MED ORDER — SERTRALINE HCL 50 MG PO TABS
50.0000 mg | ORAL_TABLET | Freq: Every day | ORAL | Status: DC
  Administered 2016-08-23 – 2016-08-26 (×4): 50 mg via ORAL
  Filled 2016-08-22 (×7): qty 1

## 2016-08-22 MED ORDER — BUSPIRONE HCL 10 MG PO TABS
10.0000 mg | ORAL_TABLET | Freq: Two times a day (BID) | ORAL | Status: DC
  Administered 2016-08-22 – 2016-08-23 (×2): 10 mg via ORAL
  Filled 2016-08-22 (×6): qty 1

## 2016-08-22 NOTE — Progress Notes (Signed)
Patient demonstrating anxiety with multiple somatic complaints of chest discomfort, bruising of upper extremities due to blood draw, restlessness. Asked patient if she would like an aromatherapy treatment, lavender oil diluted in additional carrier oil applied to both hands and back of neck. Patient expressed thanks, stated that she enjoyed the aroma.

## 2016-08-22 NOTE — BHH Group Notes (Signed)
[facility] Group Notes: Guided Meditation  Date:  08/22/2016  Time:  4:25 PM  Type of Therapy:  Psychoeducational Skills  Participation Level:  Did Not Attend  Participation Quality:  N/A  Affect:  N/A  Cognitive:  N/A  Insight:  None  Engagement in Group:  None  Modes of Intervention:  Discussion and Education  Summary of Progress/Problems: Patient was invited to group   Margaretmary Bayley, Romell Wolden E 08/22/2016, 4:25 PM

## 2016-08-22 NOTE — Progress Notes (Signed)
Patient ID: Beverly Torres, female   DOB: Dec 19, 1995, 21 y.o.   MRN: [id number]  Patient remains somatically focused throughout the day. This morning patient reported chest pain, shortness of breath, and blurred vision. Her gait is steady and she is able to fill out her daily inventory sheet with no apparent difficulty. She denies SI/HI and A/V hallucinations however patient is greatly preoccupied with her somatic symptoms. She reports sleep is poor, appetite is fair, energy level is low, and concentration is good. She rates depression 3/10, hopelessness 9/10, and anxiety 5/10. Although she reports low energy level, objectively patient has been up walking the milieu most of the day. When she sits it is only for a few moments before she is up walking again or standing at the nurses station. Patient has to be redirected several times throughout the day and limits set. Patient is a high fall risk however non-compliant with safety measures put in place as evidenced by patient not wearing her yellow, fall risk socks. She is given a few pairs throughout the day and educated however patient has not been compliant. She also took of her medication identification band and threw it away in the trash. Writer instructed patient to retrieve wristband and patient did. Writer has placed this in her medication drawer for keeping. Patient marks almost all withdrawal symptoms on her daily inventory sheet however patient denies withdrawal from substances. Patient is inappropriately holding her breast throughout most of the day. Nursing staff try to redirect patient but she reports, "my chest hurts." She is disheveled in appearance as evidenced by her clothing that has food stains and her abdomen exposed from underneath her shirt. Patient again is informed that she needs to cover her midriff and to remove her hand from her breast. She is non-compliant with this instruction. An EKG was obtained and given to MD Cobos. Patient was content  that an EKG was performed and has not spoke about her chest pain for several hours now. However, shortly after the EKG was performed, patient came to writer complaining of "a lot of blood in my poop. It only happens when I wipe." Writer examined patient's stool and there was a scant amount of blood visualized. Patient was offered that writer speak to MD about medication to soften stool however patient refused. Patient then reported, "it's giving me pain in my back." Patient is calm and in no apparent distress when reporting these symptoms. Staff have offered support, encouragement, and redirection to patient throughout the day. Q15 minute safety checks and 1:1 observation orders are maintained.

## 2016-08-22 NOTE — Progress Notes (Signed)
Patient ID: Beverly Torres, female   DOB: 20-Feb-1995, 21 y.o.   MRN: [id number]  Patient currently laying in the bed in her room at this time. Her respirations are even and unlabored. Patient appears in no apparent distress at this time. Q15 minute safety checks are maintained.

## 2016-08-22 NOTE — H&P (Signed)
Psychiatric Admission Assessment Adult  Patient Identification: Beverly Torres MRN:  [id number] Date of Evaluation:  08/22/2016 Chief Complaint:  Anxiety Disorder Principal Diagnosis: Anxiety disorder Diagnosis:   Patient Active Problem List   Diagnosis Date Noted  . Anxiety disorder [F41.9] 08/21/2016  . Anxiety [F41.9] 05/02/2012  . ADHD (attention deficit hyperactivity disorder), combined type [F90.2] 03/28/2012   History of Present Illness:Per ED Note-Beverly Torres is a 21 y.o. female under IVC by her mother with a history of anxiety. Patient has been seen numerous times the emergency department for this. Patient seen earlier in the evening by Dr. Tomi Bamberger and medically cleared. I was called to evaluate the patient secondary to her anxiety about numerous symptoms. She reports chest tightness, shortness of breath, nasal congestion, feeling sweaty, headache.  She states this is not anxiety. She does endorse cough, congestion and additional URI symptoms. No treatment of this prior to arrival.  Patient reports she is an everyday smoker.   On Evaluation: Beverly Torres is awake, alert and oriented. Seen resting in bed. Reports I am here because I come to the hospital too much. Reports "  I am still have some mild chest pain"  (pointing to center of chest) denies that she is prescribed medications daily or that she is follow-by psychiatry. patient reports concerns with free floating anxiety. patient reports symptoms of worries. Support, encouragement and reassurance was provided.   Associated Signs/Symptoms: Depression Symptoms:  depressed mood, difficulty concentrating, (Hypo) Manic Symptoms:  Distractibility, Impulsivity, Irritable Mood, Anxiety Symptoms:  Excessive Worry, Panic Symptoms, Social Anxiety, Psychotic Symptoms:  Paranoia, PTSD Symptoms: NA Total Time spent with patient: 30 minutes  Past Psychiatric History:   Is the patient at risk to self? Yes.    Has the patient been a risk  to self in the past 6 months? Yes.    Has the patient been a risk to self within the distant past? Yes.    Is the patient a risk to others? No.  Has the patient been a risk to others in the past 6 months? No.  Has the patient been a risk to others within the distant past? No.   Prior Inpatient Therapy:   Prior Outpatient Therapy:    Alcohol Screening: Patient refused Alcohol Screening Tool: Yes 1. How often do you have a drink containing alcohol?: Never 9. Have you or someone else been injured as a result of your drinking?: No 10. Has a relative or friend or a doctor or another health worker been concerned about your drinking or suggested you cut down?: No Alcohol Use Disorder Identification Test Final Score (AUDIT): 0 Brief Intervention: Patient declined brief intervention Substance Abuse History in the last 12 months:  No. Consequences of Substance Abuse: NA Previous Psychotropic Medications: no Psychological Evaluations: no Past Medical History:  Past Medical History:  Diagnosis Date  . Acid reflux   . Anxiety   . Anxiety   . Attention deficit disorder     Past Surgical History:  Procedure Laterality Date  . ADENOIDECTOMY    . TONSILLECTOMY    . WISDOM TOOTH EXTRACTION     Family History:  Family History  Problem Relation Age of Onset  . Anxiety disorder Mother   . Drug abuse Father   . ADD / ADHD Father   . ADD / ADHD Sister   . Anxiety disorder Sister   . ADD / ADHD Brother   . ODD Brother   . Rheum arthritis Maternal Grandmother   .  Kidney failure Maternal Grandmother   . Hypertension Maternal Grandfather   . Diabetes Maternal Grandfather   . Arthritis Maternal Grandfather   . Cancer Maternal Grandfather        Melanoma  . Diabetes Paternal Grandfather    Family Psychiatric  History: Tobacco Screening: Have you used any form of tobacco in the last 30 days? (Cigarettes, Smokeless Tobacco, Cigars, and/or Pipes): No Social History:  History  Alcohol Use No      History  Drug Use No    Additional Social History:                           Allergies:   Allergies  Allergen Reactions  . Strawberry (Diagnostic)    Lab Results:  Results for orders placed or performed during the hospital encounter of 08/21/16 (from the past 48 hour(s))  Glucose, capillary     Status: Abnormal   Collection Time: 08/21/16  2:23 PM  Result Value Ref Range   Glucose-Capillary 116 (H) 65 - 99 mg/dL   Comment 1 Notify RN    Comment 2 Document in Chart     Blood Alcohol level:  Lab Results  Component Value Date   ETH <5 08/20/2016   ETH <5 18/29/9371    Metabolic Disorder Labs:  No results found for: HGBA1C, MPG No results found for: PROLACTIN No results found for: CHOL, TRIG, HDL, CHOLHDL, VLDL, LDLCALC  Current Medications: Current Facility-Administered Medications  Medication Dose Route Frequency Provider Last Rate Last Dose  . acetaminophen (TYLENOL) tablet 650 mg  650 mg Oral Q4H PRN Okonkwo, Justina A, NP   650 mg at 08/22/16 0102  . albuterol (PROVENTIL HFA;VENTOLIN HFA) 108 (90 Base) MCG/ACT inhaler 2 puff  2 puff Inhalation Q4H PRN Okonkwo, Justina A, NP   2 puff at 08/21/16 2309  . alum & mag hydroxide-simeth (MAALOX/MYLANTA) 200-200-20 MG/5ML suspension 30 mL  30 mL Oral Q4H PRN Okonkwo, Justina A, NP      . busPIRone (BUSPAR) tablet 7.5 mg  7.5 mg Oral BID Okonkwo, Justina A, NP      . cephALEXin (KEFLEX) capsule 500 mg  500 mg Oral TID Lu Duffel, Justina A, NP   500 mg at 08/21/16 1648  . famotidine (PEPCID) tablet 20 mg  20 mg Oral BID Okonkwo, Justina A, NP   20 mg at 08/21/16 1726  . fluticasone (FLONASE) 50 MCG/ACT nasal spray 2 spray  2 spray Each Nare Daily Okonkwo, Justina A, NP      . hydrOXYzine (ATARAX/VISTARIL) tablet 50 mg  50 mg Oral TID PRN Lu Duffel, Justina A, NP   50 mg at 08/21/16 2355  . loratadine (CLARITIN) tablet 10 mg  10 mg Oral Daily Okonkwo, Justina A, NP      . LORazepam (ATIVAN) tablet 0.5 mg  0.5 mg Oral  Q6H PRN Cobos, Myer Peer, MD   0.5 mg at 08/21/16 1643  . magnesium hydroxide (MILK OF MAGNESIA) suspension 30 mL  30 mL Oral Daily PRN Okonkwo, Justina A, NP      . nystatin cream (MYCOSTATIN)   Topical BID Starkes, Takia S, FNP      . traZODone (DESYREL) tablet 50 mg  50 mg Oral QHS PRN Lu Duffel, Justina A, NP   50 mg at 08/21/16 2110   PTA Medications: Prescriptions Prior to Admission  Medication Sig Dispense Refill Last Dose  . busPIRone (BUSPAR) 7.5 MG tablet Take 1 tablet (7.5 mg total) by mouth  2 (two) times daily. 60 tablet 0 Past Week at Unknown time  . cephALEXin (KEFLEX) 500 MG capsule Take 1 capsule (500 mg total) by mouth 3 (three) times daily. 21 capsule 0 not started  . cetirizine (ZYRTEC) 10 MG tablet Take 1 tablet (10 mg total) by mouth daily. (Patient not taking: Reported on 02/20/2016) 30 tablet 6 Completed Course at Unknown time  . fluticasone (FLONASE) 50 MCG/ACT nasal spray Place 1 spray into both nostrils daily. (Patient not taking: Reported on 08/03/2016) 16 g 2 Completed Course at Unknown time  . hydrOXYzine (ATARAX/VISTARIL) 50 MG tablet Take 1 tablet (50 mg total) by mouth 3 (three) times daily as needed for anxiety. (Patient not taking: Reported on 08/03/2016) 90 tablet 2 Completed Course at Unknown time  . LORazepam (ATIVAN) 1 MG tablet Take 1 tablet (1 mg total) by mouth every 8 (eight) hours as needed for anxiety. 12 tablet 0 Past Week at Unknown time  . naproxen (NAPROSYN) 500 MG tablet Take 1 tablet (500 mg total) by mouth 2 (two) times daily. 30 tablet 0   . omeprazole (PRILOSEC) 20 MG capsule Take 1 capsule (20 mg total) by mouth 2 (two) times daily. 60 capsule 0 Past Week at Unknown time    Musculoskeletal: Strength & Muscle Tone: within normal limits Gait & Station: normal Patient leans: N/A  Psychiatric Specialty Exam: Physical Exam  Vitals reviewed. Constitutional: She is oriented to person, place, and time. She appears well-developed.  Cardiovascular:  Normal rate.   Neurological: She is alert and oriented to person, place, and time.  Psychiatric: She has a normal mood and affect. Her behavior is normal.    Review of Systems  Psychiatric/Behavioral: The patient is nervous/anxious.     Blood pressure 107/60, pulse 82, temperature 98 F (36.7 C), temperature source Oral, resp. rate 16, height 5\' 2"  (1.575 m), weight 92.5 kg (204 lb), last menstrual period 08/03/2016, SpO2 100 %.Body mass index is 37.31 kg/m.  General Appearance: Guarded  Eye Contact:  Fair  Speech:  Clear and Coherent  Volume:  Normal  Mood:  Anxious  Affect:  Blunt, Depressed and Flat  Thought Process:  Linear  Orientation:  Full (Time, Place, and Person)  Thought Content:  Rumination  Suicidal Thoughts:  No  Homicidal Thoughts:  No  Memory:  Immediate;   Fair Recent;   Fair Remote;   Fair  Judgement:  Fair  Insight:  Fair  Psychomotor Activity:  Decreased  Concentration:  Concentration: Fair  Recall:  [employer] of Knowledge:  Fair  Language:  Fair  Akathisia:  No  Handed:  Right  AIMS (if indicated):     Assets:  Communication Skills Desire for Improvement Resilience Social Support  ADL's:  Intact  Cognition:  WNL  Sleep:  Number of Hours: 3.25      I agree with current treatment plan on 08/22/2016, Patient seen face-to-face for psychiatric evaluation follow-up, chart reviewed and case discussed with the MD Cobse. Reviewed the information documented and agree with the treatment plan.  Treatment Plan Summary: Daily contact with patient to assess and evaluate symptoms and progress in treatment and Medication management   See SRA for medication management   Will continue to monitor vitals ,medication compliance and treatment side effects while patient is here.  Reviewed labs:,BAL - 0, UDS +  Barbiturates CSW will start working on disposition.  Patient to participate in therapeutic milieu   Observation Level/Precautions:  15 minute checks   Laboratory:  CBC Chemistry Profile HbAIC UDS tsh added  Psychotherapy:  Individual and group session  Medications:  See above  Consultations:  psychiatry  Discharge Concerns:  Safety, stabilization, and risk of access to medication and medication stabilization   Estimated LOS:5-7days  Other:     Physician Treatment Plan for Primary Diagnosis: Anxiety disorder Long Term Goal(s): Improvement in symptoms so as ready for discharge  Short Term Goals: Ability to identify changes in lifestyle to reduce recurrence of condition will improve, Ability to verbalize feelings will improve and Ability to identify and develop effective coping behaviors will improve  Physician Treatment Plan for Secondary Diagnosis: Principal Problem:   Anxiety disorder  Long Term Goal(s): Improvement in symptoms so as ready for discharge  Short Term Goals: Ability to demonstrate self-control will improve, Ability to identify and develop effective coping behaviors will improve and Ability to identify triggers associated with substance abuse/mental health issues will improve  I certify that inpatient services furnished can reasonably be expected to improve the patient's condition.    Derrill Center, NP 7/15/20189:05 AM

## 2016-08-22 NOTE — Progress Notes (Signed)
Patient ID: Beverly Torres, female   DOB: [birth year], 21 y.o.   MRN: [id number]  Nursing 1:1 Note-  Patient is currently standing at the medication window talking to writer about nicotine gum. She is calm at this time, respirations are even and unlabored. She continues to report pain in her chest and requests to speak with MD Cobos again. 1:1 observation is initiated at this time for safety. Q15 minute safety checks are maintained as well.

## 2016-08-22 NOTE — [facility] Group Notes (Signed)
[place] [facility] Group Notes: (Clinical Social Work)   08/22/2016      Type of Therapy:  Group Therapy   Participation Level:  Did Not Attend despite MHT prompting   Selmer Dominion, LCSW 08/22/2016, 12:24 PM
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
## 2016-08-22 NOTE — Progress Notes (Signed)
Pt sts she cant sleep.  Pt asking for vitals to be taken again. PRN sleep med given.  Vitals taken Pt continues on 1:1 observation
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
## 2016-08-22 NOTE — Progress Notes (Addendum)
Patient ID: Beverly Torres, female   DOB: [birth year], 21 y.o.   MRN: [id number]  1:1 Nursing Note- Patient is currently sitting in a chair in the hallway getting her vital signs taken. Patient reports her abdomen "hurts." She is calm at this time and no facial expression is made when she speaks of this pain. She continued to be somatically focused with little insight into her preoccupation. Staff continued to redirect patient. Q15 minute safety checks and 1:1 observation is continued for safety.

## 2016-08-22 NOTE — Progress Notes (Signed)
1:1 note:  Pt on unit with 2 visitors.  Pt is smiling and talking to visitors, staff and other pt's.  Pt is inappropriate with clothing and exposes her stomach often by pulling her shirt up.  Pt is preoccupied with constant requests from staff.  Pt needs continual redirection. Q 15 min safety and 1:1 observation continues for pt safety.

## 2016-08-22 NOTE — Progress Notes (Signed)
Adult Psychoeducational Group Note  Date:  08/22/2016 Time:  8:58 PM  Group Topic/Focus:  Wrap-Up Group:   The focus of this group is to help patients review their daily goal of treatment and discuss progress on daily workbooks.  Participation Level:  Did Not Attend  Participation Quality: None  Affect:  Tearful  Cognitive:  Lacking  Insight: None  Engagement in Group:  None  Modes of Intervention:  none  Additional Comments:  Pt did not attend wrap-up group.  Donato Heinz 08/22/2016, 8:58 PM

## 2016-08-22 NOTE — Progress Notes (Signed)
Patient ID: Beverly Torres, female   DOB: 04-08-1995, 21 y.o.   MRN: [id number]  1:1 Nursing Note- Patient is currently in the alcove in the hallway making a phone call. She appears in no current distress at this time. Her respirations are even and unlabored. Her behavior is appropriate at this time. Q15 minute safety checks are maintained and 1:1 observation is maintained as well.

## 2016-08-22 NOTE — Progress Notes (Signed)
Writer has observed patient walking the halls off and on, in the dayroom briefly watching tv and walking the halls or standing at nursing station. She compalins to Probation officer that she doesn' t feel well and her chest is hurting. Vitals taken and bp is slightly low, fluids given and encouraged to use relaxation breathing techniques. She continues to come to nursing station with her hand up to her chest c/o pain. Writer gave visteril for anxiety and sleep aid. Patient sat up in dayroom watching tv interacting with another patient off and on. Will monitor effectiveness of medication given. Safety maintained on unit with 15 min checks.

## 2016-08-23 DIAGNOSIS — Z818 Family history of other mental and behavioral disorders: Secondary | ICD-10-CM

## 2016-08-23 DIAGNOSIS — F419 Anxiety disorder, unspecified: Secondary | ICD-10-CM

## 2016-08-23 DIAGNOSIS — Z813 Family history of other psychoactive substance abuse and dependence: Secondary | ICD-10-CM

## 2016-08-23 LAB — TSH: TSH: 3.649 u[IU]/mL (ref 0.350–4.500)

## 2016-08-23 LAB — LIPASE, BLOOD: LIPASE: 26 U/L (ref 11–51)

## 2016-08-23 LAB — AMYLASE: AMYLASE: 45 U/L (ref 28–100)

## 2016-08-23 MED ORDER — MUPIROCIN CALCIUM 2 % EX CREA
TOPICAL_CREAM | Freq: Two times a day (BID) | CUTANEOUS | Status: DC
  Administered 2016-08-23 – 2016-08-24 (×2): via TOPICAL
  Filled 2016-08-23: qty 15

## 2016-08-23 MED ORDER — LORAZEPAM 1 MG PO TABS
1.0000 mg | ORAL_TABLET | ORAL | Status: AC | PRN
  Administered 2016-08-23: 1 mg via ORAL
  Filled 2016-08-23: qty 1

## 2016-08-23 MED ORDER — IBUPROFEN 600 MG PO TABS
600.0000 mg | ORAL_TABLET | Freq: Two times a day (BID) | ORAL | Status: DC | PRN
  Administered 2016-08-23 – 2016-08-27 (×7): 600 mg via ORAL
  Filled 2016-08-23 (×7): qty 1

## 2016-08-23 MED ORDER — ZIPRASIDONE MESYLATE 20 MG IM SOLR: 10.0000 mg | Freq: Once | INTRAMUSCULAR | Status: DC | PRN

## 2016-08-23 MED ORDER — OLANZAPINE 10 MG PO TBDP: 10.0000 mg | ORAL_TABLET | Freq: Once | ORAL | Status: DC

## 2016-08-23 MED ORDER — ZIPRASIDONE MESYLATE 20 MG IM SOLR
10.0000 mg | Freq: Once | INTRAMUSCULAR | Status: DC | PRN
  Filled 2016-08-23: qty 20

## 2016-08-23 MED ORDER — ARIPIPRAZOLE 5 MG PO TABS
5.0000 mg | ORAL_TABLET | Freq: Every day | ORAL | Status: DC
  Filled 2016-08-23 (×2): qty 1

## 2016-08-23 MED ORDER — TRAMADOL HCL 50 MG PO TABS
50.0000 mg | ORAL_TABLET | Freq: Three times a day (TID) | ORAL | Status: DC | PRN
  Administered 2016-08-23: 50 mg via ORAL
  Filled 2016-08-23: qty 1

## 2016-08-23 MED ORDER — DIPHENHYDRAMINE HCL 50 MG/ML IJ SOLN: 25.0000 mg | Freq: Four times a day (QID) | INTRAMUSCULAR | Status: DC | PRN

## 2016-08-23 MED ORDER — LORAZEPAM 1 MG PO TABS: 1.0000 mg | ORAL_TABLET | ORAL | Status: DC | PRN

## 2016-08-23 MED ORDER — DIPHENHYDRAMINE HCL 50 MG/ML IJ SOLN
INTRAMUSCULAR | Status: AC
  Administered 2016-08-23: 50 mg
  Filled 2016-08-23: qty 1

## 2016-08-23 MED ORDER — DIPHENHYDRAMINE HCL 50 MG/ML IJ SOLN
25.0000 mg | Freq: Once | INTRAMUSCULAR | Status: DC
  Filled 2016-08-23: qty 0.5

## 2016-08-23 MED ORDER — OLANZAPINE 10 MG PO TBDP: 10.0000 mg | ORAL_TABLET | Freq: Three times a day (TID) | ORAL | Status: DC | PRN

## 2016-08-23 MED ORDER — KETOROLAC TROMETHAMINE 10 MG PO TABS
10.0000 mg | ORAL_TABLET | Freq: Once | ORAL | Status: DC
  Filled 2016-08-23 (×2): qty 1

## 2016-08-23 NOTE — BHH Group Notes (Signed)
[place] LCSW Group Therapy  08/23/2016 1:15 pm  Type of Therapy: Process Group Therapy  Participation Level:  Active  Participation Quality:  Appropriate  Affect:  Flat  Cognitive:  Oriented  Insight:  Improving  Engagement in Group:  Limited  Engagement in Therapy:  Limited  Modes of Intervention:  Activity, Clarification, Education, Problem-solving and Support  Summary of Progress/Problems: Today's group addressed the issue of overcoming obstacles.  Patients were asked to identify their biggest obstacle post d/c that stands in the way of their on-going success, and then problem solve as to how to manage this. Invited.  Chose to not attend.  Beverly Torres B 08/23/2016   3:10 PM

## 2016-08-23 NOTE — BH Assessment (Signed)
Medication Adjustment   Treatment Plan Summary: Daily contact with patient to assess and evaluate symptoms and progress in treatment and Medication management. Plan discussed with MD Larena Sox  Buspar 10mg  for anxiety  Continue  Zoloft 50 mg  for mood stabilization. Started Abilify 5 mg PO QHS for mood stabilization Continue with Trazodone 50 mg for insomnia Will continue to monitor vitals ,medication compliance and treatment side effects while patient is here.  Reviewed labs: TSH 3.69,BAL - , UDS - Barbiturates CSW will start working on disposition.  Patient to participate in therapeutic milieu

## 2016-08-23 NOTE — Progress Notes (Signed)
1:1 Note: Patient maintained on constant supervision for safety.  Patient continues to be disruptive on the unit.  Patient is verbally abusive towards staff.  Patient redirected several times on the unit.  Patient continues to be labile, somatic and tearful.  Patient pacing the hallway and blocking the entrance to the unit.  Patient forced her way out of the unit when other patient were returning from dinner.  AC and security were able to return patient back to the unit after several encouragements and redirection.  Patient continues to demand to be sent to the ER.  Staff continues to offer support and encouragement as needed.

## 2016-08-23 NOTE — Progress Notes (Signed)
Recreation Therapy Notes  Date: 08/23/2016 Time: 10:00am Location: 500 Hall Dayroom  Group Topic: Coping Skills and Self-Esteem  Goal Area(s) Addresses:  Pt will be able to identify traits about themselves that are unique. Pt will be able to identify coping skills for different emotions.  Intervention: Game  Activity: Cards. Pt will receive two cards and depending on the number they have they will have to answer the question or perform the action that has been previously identified by recreation therapy intern. The question pertain to coping skills and self-esteem.  Education: Radiographer, therapeutic, Self-Esteem, Discharge Planning  Education Outcome: Acknowledges understanding   Clinical Observations/Feedback: Pt did not attend group.  Donovan Kail, Recreation Therapy Intern

## 2016-08-23 NOTE — Progress Notes (Signed)
Recreation Therapy Notes  INPATIENT RECREATION THERAPY ASSESSMENT  Patient Details Name: Beverly Torres MRN: [id number] DOB: Dec 16, 1995 Today's Date: 08/23/2016  Pt reported that she was admitted because she called the EMS too many times.  Patient Stressors:    Coping Skills:   Isolate, Arguments, Avoidance, Exercise, Art/Dance, Talking, Music, Sports  Personal Challenges:    Leisure Interests (2+):  Art - Draw, Art - Piercing Others (Jewelery)  Awareness of Community Resources:  Yes  Community Resources:  Library, Chief Executive Officer  Current Use: Yes  If no, Barriers?:    Patient Strengths:  good at drawing and reading  Patient Identified Areas of Improvement:  "the paint I'm feeling"  Current Recreation Participation:  5x a week  Patient Goal for Hospitalization:  "I don't know. I'm not getting anything helpful."  [place] of Residence:  [place] of Residence:  [place]   Current [place] (including self-harm):  No  Current [place]:  No  Consent to Intern Participation: Yes  Donovan Kail, Recreation Therapy Intern  Donovan Kail 08/23/2016, 12:43 PM

## 2016-08-23 NOTE — Progress Notes (Signed)
1:1 note:  Pt remains in room with sitter.  Pt is sleeping.  Pt on 1:1 observation be sitter.  Pt remains safe on unit.

## 2016-08-23 NOTE — Tx Team (Signed)
Interdisciplinary Treatment and Diagnostic Plan Update  08/23/2016 Time of Session: 8:37 AM  Beverly Torres [facility] MRN: [id number]  Principal Diagnosis: Anxiety disorder  Secondary Diagnoses: Principal Problem:   Anxiety disorder   Current Medications:  Current Facility-Administered Medications  Medication Dose Route Frequency Provider Last Rate Last Dose  . acetaminophen (TYLENOL) tablet 650 mg  650 mg Oral Q4H PRN Okonkwo, Justina A, NP   650 mg at 08/22/16 0102  . albuterol (PROVENTIL HFA;VENTOLIN HFA) 108 (90 Base) MCG/ACT inhaler 2 puff  2 puff Inhalation Q4H PRN Okonkwo, Justina A, NP   2 puff at 08/22/16 2130  . alum & mag hydroxide-simeth (MAALOX/MYLANTA) 200-200-20 MG/5ML suspension 30 mL  30 mL Oral Q4H PRN Okonkwo, Justina A, NP      . busPIRone (BUSPAR) tablet 10 mg  10 mg Oral BID Cobos, Myer Peer, MD   10 mg at 08/22/16 1707  . cephALEXin (KEFLEX) capsule 500 mg  500 mg Oral TID Lu Duffel, Justina A, NP   500 mg at 08/22/16 1707  . famotidine (PEPCID) tablet 20 mg  20 mg Oral BID Okonkwo, Justina A, NP   20 mg at 08/22/16 1707  . fluticasone (FLONASE) 50 MCG/ACT nasal spray 2 spray  2 spray Each Nare Daily Okonkwo, Justina A, NP   2 spray at 08/22/16 1228  . hydrOXYzine (ATARAX/VISTARIL) tablet 50 mg  50 mg Oral TID PRN Hughie Closs A, NP   50 mg at 08/22/16 2128  . loratadine (CLARITIN) tablet 10 mg  10 mg Oral Daily Okonkwo, Justina A, NP   10 mg at 08/22/16 0944  . LORazepam (ATIVAN) tablet 0.5 mg  0.5 mg Oral Q6H PRN Cobos, Myer Peer, MD   0.5 mg at 08/22/16 1526  . magnesium hydroxide (MILK OF MAGNESIA) suspension 30 mL  30 mL Oral Daily PRN Okonkwo, Justina A, NP      . nicotine polacrilex (NICORETTE) gum 2 mg  2 mg Oral PRN Derrill Center, NP   2 mg at 08/22/16 2128  . nystatin cream (MYCOSTATIN)   Topical BID Nanci Pina, FNP      . sertraline (ZOLOFT) tablet 50 mg  50 mg Oral Daily Cobos, Fernando A, MD      . traZODone (DESYREL) tablet 50 mg  50 mg Oral QHS PRN  Lu Duffel, Justina A, NP   50 mg at 08/22/16 2218    PTA Medications: Prescriptions Prior to Admission  Medication Sig Dispense Refill Last Dose  . busPIRone (BUSPAR) 7.5 MG tablet Take 1 tablet (7.5 mg total) by mouth 2 (two) times daily. 60 tablet 0 Past Week at Unknown time  . cephALEXin (KEFLEX) 500 MG capsule Take 1 capsule (500 mg total) by mouth 3 (three) times daily. 21 capsule 0 not started  . cetirizine (ZYRTEC) 10 MG tablet Take 1 tablet (10 mg total) by mouth daily. (Patient not taking: Reported on 02/20/2016) 30 tablet 6 Completed Course at Unknown time  . fluticasone (FLONASE) 50 MCG/ACT nasal spray Place 1 spray into both nostrils daily. (Patient not taking: Reported on 08/03/2016) 16 g 2 Completed Course at Unknown time  . hydrOXYzine (ATARAX/VISTARIL) 50 MG tablet Take 1 tablet (50 mg total) by mouth 3 (three) times daily as needed for anxiety. (Patient not taking: Reported on 08/03/2016) 90 tablet 2 Completed Course at Unknown time  . LORazepam (ATIVAN) 1 MG tablet Take 1 tablet (1 mg total) by mouth every 8 (eight) hours as needed for anxiety. 12 tablet 0 Past Week at  Unknown time  . naproxen (NAPROSYN) 500 MG tablet Take 1 tablet (500 mg total) by mouth 2 (two) times daily. 30 tablet 0   . omeprazole (PRILOSEC) 20 MG capsule Take 1 capsule (20 mg total) by mouth 2 (two) times daily. 60 capsule 0 Past Week at Unknown time    Patient Stressors: Financial difficulties Health problems  Patient Strengths: Communication skills  Treatment Modalities: Medication Management, Group therapy, Case management,  1 to 1 session with clinician, Psychoeducation, Recreational therapy.   Physician Treatment Plan for Primary Diagnosis: Anxiety disorder Long Term Goal(s): Improvement in symptoms so as ready for discharge  Short Term Goals: Ability to identify changes in lifestyle to reduce recurrence of condition will improve Ability to verbalize feelings will improve Ability to identify and  develop effective coping behaviors will improve Ability to demonstrate self-control will improve Ability to identify and develop effective coping behaviors will improve Ability to identify triggers associated with substance abuse/mental health issues will improve  Medication Management: Evaluate patient's response, side effects, and tolerance of medication regimen.  Therapeutic Interventions: 1 to 1 sessions, Unit Group sessions and Medication administration.  Evaluation of Outcomes: Progressing  Physician Treatment Plan for Secondary Diagnosis: Principal Problem:   Anxiety disorder   Long Term Goal(s): Improvement in symptoms so as ready for discharge  Short Term Goals: Ability to identify changes in lifestyle to reduce recurrence of condition will improve Ability to verbalize feelings will improve Ability to identify and develop effective coping behaviors will improve Ability to demonstrate self-control will improve Ability to identify and develop effective coping behaviors will improve Ability to identify triggers associated with substance abuse/mental health issues will improve  Medication Management: Evaluate patient's response, side effects, and tolerance of medication regimen.  Therapeutic Interventions: 1 to 1 sessions, Unit Group sessions and Medication administration.  Evaluation of Outcomes: Progressing   RN Treatment Plan for Primary Diagnosis: Anxiety disorder Long Term Goal(s): Knowledge of disease and therapeutic regimen to maintain health will improve  Short Term Goals: Ability to demonstrate self-control and Ability to identify and develop effective coping behaviors will improve  Medication Management: RN will administer medications as ordered by provider, will assess and evaluate patient's response and provide education to patient for prescribed medication. RN will report any adverse and/or side effects to prescribing provider.  Therapeutic Interventions: 1 on  1 counseling sessions, Psychoeducation, Medication administration, Evaluate responses to treatment, Monitor vital signs and CBGs as ordered, Perform/monitor CIWA, COWS, AIMS and Fall Risk screenings as ordered, Perform wound care treatments as ordered.  Evaluation of Outcomes: Progressing   Recreational Therapy Treatment Plan for Primary Diagnosis: Anxiety disorder Long Term Goal(s): Patient will participate in recreation therapy treatment in at least 2 group sessions without prompting from LRT  Short Term Goals: Patient will demonstrate increased ability to follow instructions, as demonstrated by ability to follow LRT instructions on first prompt during recreation therapy group sessions  Treatment Modalities: Group and Pet Therapy  Therapeutic Interventions: Psychoeducation  Evaluation of Outcomes: Progressing  LCSW Treatment Plan for Primary Diagnosis: Anxiety disorder Long Term Goal(s): Safe transition to appropriate next level of care at discharge, Engage patient in therapeutic group addressing interpersonal concerns.  Short Term Goals: Engage patient in aftercare planning with referrals and resources  Therapeutic Interventions: Assess for all discharge needs, 1 to 1 time with Social worker, Explore available resources and support systems, Assess for adequacy in community support network, Educate family and significant other(s) on suicide prevention, Complete Psychosocial Assessment, Interpersonal group therapy.  Evaluation of Outcomes: Met  Return home with mother, follow up [facility]   Progress in Treatment: Attending groups: Yes Participating in groups: Yes Taking medication as prescribed: Yes Toleration medication: Yes, no side effects reported at this time Family/Significant other contact made: Yes Patient understands diagnosis: No Somatically focused Discussing patient identified problems/goals with staff: Yes Medical problems stabilized or resolved: Yes Denies  suicidal/homicidal ideation: Yes Issues/concerns per patient self-inventory: None Other: N/A  New problem(s) identified: None identified at this time.   New Short Term/Long Term Goal(s): None identified at this time.   Discharge Plan or Barriers:   Reason for Continuation of Hospitalization: Anxiety Delusions  Depression Medication stabilization   Estimated Length of Stay: 3-5 days  Attendees: Patient: 08/23/2016  8:37 AM  Physician: Ursula Alert, MD 08/23/2016  8:37 AM  Nursing: Sena Hitch, RN 08/23/2016  8:37 AM  RN Care Manager: Lars Pinks, RN 08/23/2016  8:37 AM  Social Worker: Ripley Fraise 08/23/2016  8:37 AM  Recreational Therapist: Victorino Sparrow, LRT/CTRS 08/23/2016  8:37 AM  Other: Norberto Sorenson 08/23/2016  8:37 AM  Other:  08/23/2016  8:37 AM    Scribe for Treatment Team:  Roque Lias LCSW 08/23/2016 8:37 AM

## 2016-08-23 NOTE — Progress Notes (Signed)
1:1 Note  Pt initially denied any anxiety, pain, AVH, SI or depression then later complained of L. side pain and severe anxiety 5 minutes later. Pt did not attend wrap-up group. Pt observed pacing on the hallway. 1:1 staff is present and interacting with Pt at this time. 1:1 monitoring continues for Pt's safety.

## 2016-08-23 NOTE — BHH Suicide Risk Assessment (Addendum)
[place] INPATIENT:  Family/Significant Other Suicide Prevention Education  Suicide Prevention Education:  Education Completed; No one has been identified by the patient as the family member/significant other with whom the patient will be residing, and identified as the person(s) who will aid the patient in the event of a mental health crisis (suicidal ideations/suicide attempt).  With written consent from the patient, the family member/significant other has been provided the following suicide prevention education, prior to the and/or following the discharge of the patient.  The suicide prevention education provided includes the following:  Suicide risk factors  Suicide prevention and interventions  National Suicide Hotline telephone number  [facility] assessment telephone number  [facility] Emergency Assistance [place] and/or Residential Mobile Crisis Unit telephone number  Request made of family/significant other to:  Remove weapons (e.g., guns, rifles, knives), all items previously/currently identified as safety concern.    Remove drugs/medications (over-the-counter, prescriptions, illicit drugs), all items previously/currently identified as a safety concern.  The family member/significant other verbalizes understanding of the suicide prevention education information provided.  The family member/significant other agrees to remove the items of safety concern listed above. The patient did not endorse SI at the time of admission, nor did the patient c/o SI during the stay here.  SPE not required. However, I did talk to mother, Nelida Gores, [phone], about crises plan and treatment team recommendations.  [name] assured me that her fiance would lock up his farearm in the lock box, and would keep the key on his person at all times.  [place] 08/23/2016, 3:04 PM

## 2016-08-23 NOTE — Progress Notes (Signed)
Received request from nursing supervisor requesting a consult for patient for c/o of pain and agitation to go the hospital. Patient seen, chart reviewed, vital sign reviewed. Patient were standing by the entrance door to the [address] in an anxious/tearful mood/affect. On assessment, patient reported overbearing pain to her RLQ that has been ongoing all day today. Patient rated the pain as severe, "never like any pain I've ever had". At first, patient stated that she also noted blood in her stool which she described as dark stool. Upon further inquiry, patient then stated that she has been straining today and have not really had any BM today. Patient appears to be somatized and unable to be reoriented to reality. Patient stated that she wants to go to the hospital to be checked out because she doesn't want to die. Patient's mind was fixated on another patient whom she described as " he almost died" on the unit due to severe pain and not eating. Multiple attempts were made to encourage patient to take medication for pain which she finally agreed to.   Impression: Patient's vitals were stable; (T98.3 (oral), BP106/72, P65, R20, O2sat100%), lungs are clear to auscultation, S1S2 wnl, all system were negative, except for RLQ abdominal pain. Abdomen is without any mass, tender to light palpation on all 4 quadrants, positive bowel sound x4.   Plan: -Rx for Tramadol 50mg  po tid prn for moderate to severe pain ordered.  -Patient's lab is being checked for amilase and lipase at this time as previously ordered -D/C Acetaminophen 650mg , continue all other medications without change.

## 2016-08-23 NOTE — Progress Notes (Signed)
1:1 Note: Patient maintained on constant supervision for safety.  Patient presents with flat affect and depressed mood.  Medication given as prescribed.  Patient denies suicidal thoughts, auditory and visual hallucinations.  Patient offered support and encouragement as needed.  Patient is safe on the unit with supervision.

## 2016-08-23 NOTE — Progress Notes (Signed)
1:1 note.  Pt asleep.  1:1 maintained for pt safety

## 2016-08-23 NOTE — Progress Notes (Signed)
[facility] MD Progress Note  08/23/2016 9:50 AM Beverly Torres  MRN:  [id number] Subjective:  Beverly Torres reports " I am feeling so tired today."  Objective: Beverly Torres is awake, alert and oriented. Seen standing at the nursing station. Patient reports attending group session, however reports she wasn't paying attention.  Denies suicidal or homicidal ideation during this assessment. Denies auditory or visual hallucination and does not appear to be responding to internal stimuli.  Beverly Torres has concerns with her recent EKG and Labs. Chart was reviewed Beverly Torres for reassurance. TSH 3.649. Patient reports her depression 9/10 only because she is in the hospital. Support, encouragement and reassurance was provided.    Principal Problem: Anxiety disorder Diagnosis:   Patient Active Problem List   Diagnosis Date Noted  . Anxiety disorder [F41.9] 08/21/2016  . Anxiety [F41.9] 05/02/2012  . ADHD (attention deficit hyperactivity disorder), combined type [F90.2] 03/28/2012   Total Time spent with patient: 30 minutes  Past Psychiatric History:   Past Medical History:  Past Medical History:  Diagnosis Date  . Acid reflux   . Anxiety   . Anxiety   . Attention deficit disorder     Past Surgical History:  Procedure Laterality Date  . ADENOIDECTOMY    . TONSILLECTOMY    . WISDOM TOOTH EXTRACTION     Family History:  Family History  Problem Relation Age of Onset  . Anxiety disorder Mother   . Drug abuse Father   . ADD / ADHD Father   . ADD / ADHD Sister   . Anxiety disorder Sister   . ADD / ADHD Brother   . ODD Brother   . Rheum arthritis Maternal Grandmother   . Kidney failure Maternal Grandmother   . Hypertension Maternal Grandfather   . Diabetes Maternal Grandfather   . Arthritis Maternal Grandfather   . Cancer Maternal Grandfather        Melanoma  . Diabetes Paternal Grandfather    Family Psychiatric  History:  Social History:  History  Alcohol Use No     History  Drug Use No    Social  History   Social History  . Marital status: Single    Spouse name: N/A  . Number of children: N/A  . Years of education: N/A   Social History Main Topics  . Smoking status: Current Every Day Smoker    Types: Cigarettes  . Smokeless tobacco: Never Used  . Alcohol use No  . Drug use: No  . Sexual activity: No   Other Topics Concern  . None   Social History Narrative  . None   Additional Social History:                         Sleep: Fair  Appetite:  Fair  Current Medications: Current Facility-Administered Medications  Medication Dose Route Frequency Provider Last Rate Last Dose  . acetaminophen (TYLENOL) tablet 650 mg  650 mg Oral Q4H PRN Okonkwo, Justina A, NP   650 mg at 08/22/16 0102  . albuterol (PROVENTIL HFA;VENTOLIN HFA) 108 (90 Base) MCG/ACT inhaler 2 puff  2 puff Inhalation Q4H PRN Okonkwo, Justina A, NP   2 puff at 08/23/16 0938  . alum & mag hydroxide-simeth (MAALOX/MYLANTA) 200-200-20 MG/5ML suspension 30 mL  30 mL Oral Q4H PRN Okonkwo, Justina A, NP      . busPIRone (BUSPAR) tablet 10 mg  10 mg Oral BID Cobos, Myer Peer, MD   10 mg at 08/23/16 0937  .  cephALEXin (KEFLEX) capsule 500 mg  500 mg Oral TID Lu Duffel, Justina A, NP   500 mg at 08/23/16 0936  . famotidine (PEPCID) tablet 20 mg  20 mg Oral BID Okonkwo, Justina A, NP   20 mg at 08/23/16 0937  . fluticasone (FLONASE) 50 MCG/ACT nasal spray 2 spray  2 spray Each Nare Daily Okonkwo, Justina A, NP   2 spray at 08/23/16 0937  . hydrOXYzine (ATARAX/VISTARIL) tablet 50 mg  50 mg Oral TID PRN Hughie Closs A, NP   50 mg at 08/22/16 2128  . loratadine (CLARITIN) tablet 10 mg  10 mg Oral Daily Okonkwo, Justina A, NP   10 mg at 08/23/16 0937  . LORazepam (ATIVAN) tablet 0.5 mg  0.5 mg Oral Q6H PRN Cobos, Myer Peer, MD   0.5 mg at 08/22/16 1526  . magnesium hydroxide (MILK OF MAGNESIA) suspension 30 mL  30 mL Oral Daily PRN Okonkwo, Justina A, NP      . mupirocin cream (BACTROBAN) 2 %   Topical BID  Derrill Center, NP      . nicotine polacrilex (NICORETTE) gum 2 mg  2 mg Oral PRN Derrill Center, NP   2 mg at 08/22/16 2128  . nystatin cream (MYCOSTATIN)   Topical BID Nanci Pina, FNP      . sertraline (ZOLOFT) tablet 50 mg  50 mg Oral Daily Cobos, Myer Peer, MD   50 mg at 08/23/16 0937  . traZODone (DESYREL) tablet 50 mg  50 mg Oral QHS PRN Lu Duffel, Justina A, NP   50 mg at 08/22/16 2218    Lab Results:  Results for orders placed or performed during the hospital encounter of 08/21/16 (from the past 48 hour(s))  Glucose, capillary     Status: Abnormal   Collection Time: 08/21/16  2:23 PM  Result Value Ref Range   Glucose-Capillary 116 (H) 65 - 99 mg/dL   Comment 1 Notify RN    Comment 2 Document in Chart   TSH     Status: None   Collection Time: 08/23/16  6:22 AM  Result Value Ref Range   TSH 3.649 0.350 - 4.500 uIU/mL    Comment: Performed by a 3rd Generation assay with a functional sensitivity of <=0.01 uIU/mL. Performed at [facility], [place] [address] [place], [place] [address]     Blood Alcohol level:  Lab Results  Component Value Date   ETH <5 08/20/2016   ETH <5 84/69/6295    Metabolic Disorder Labs: No results found for: HGBA1C, MPG No results found for: PROLACTIN No results found for: CHOL, TRIG, HDL, CHOLHDL, VLDL, LDLCALC  Physical Findings: AIMS:  , ,  ,  ,    CIWA:    COWS:     Musculoskeletal: Strength & Muscle Tone: within normal limits Gait & Station: normal Patient leans: N/A  Psychiatric Specialty Exam: Physical Exam  Nursing note and vitals reviewed. Constitutional: She appears well-developed.  Respiratory: Effort normal.  Neurological: She is alert.  Psychiatric: She has a normal mood and affect. Her behavior is normal.    Review of Systems  Skin: Positive for rash (chest and breast-batroban was prdx).  Psychiatric/Behavioral: Positive for depression. The patient is nervous/anxious.     Blood pressure (!)  129/96, pulse 82, temperature 98.7 F (37.1 C), temperature source Oral, resp. rate 20, height 5\' 2"  (1.575 m), weight 92.5 kg (204 lb), last menstrual period 08/03/2016, SpO2 100 %.Body mass index is 37.31 kg/m.  General  Appearance: Disheveled  Eye Contact:  Good  Speech:  Clear and Coherent  Volume:  Normal  Mood:  Anxious, Depressed and Dysphoric  Affect:  Congruent  Thought Process:  Coherent  Orientation:  Full (Time, Place, and Person)  Thought Content:  Paranoid Ideation and Rumination  Suicidal Thoughts:  No  Homicidal Thoughts:  No  Memory:  Immediate;   Fair Recent;   Fair Remote;   Fair  Judgement:  Fair  Insight:  Fair  Psychomotor Activity:  Normal  Concentration:  Concentration: Fair  Recall:  [employer] of Knowledge:  Fair  Language:  Good  Akathisia:  No  Handed:  Right  AIMS (if indicated):     Assets:  Communication Skills Housing Leisure Time Physical Health Social Support  ADL's:  Intact  Cognition:  WNL  Sleep:  Number of Hours: 5.75     I agree with current treatment plan on 08/23/2016, Patient seen face-to-face for psychiatric evaluation follow-up, chart reviewed. Reviewed the information documented and agree with the treatment plan.  Treatment Plan Summary: Daily contact with patient to assess and evaluate symptoms and progress in treatment and Medication management   Continue with Buspar 10mg  and Zoloft 50 mg  for mood stabilization. Continue with Trazodone 50 mg for insomnia Will continue to monitor vitals ,medication compliance and treatment side effects while patient is here.  Reviewed labs: TSH 3.69,BAL - , UDS - Barbiturates CSW will start working on disposition.  Patient to participate in therapeutic milieu  Derrill Center, NP 08/23/2016, 9:50 AM

## 2016-08-23 NOTE — Progress Notes (Signed)
1:1 Note: Patient maintained on constant supervision for safety.  Patient is irritable, labile and agitated.  Demanding and asking to be sent to the hospital.  Patient with a lot of somatic complaints.  Medication given as prescribed.  Staff observed patient on the phone several times yelling and screaming.  Patient reports that her needs are not being met.  Both MD and RNP assessed patient multiple times.  Patient threatened to kick down the door if staff refuses to send patient to the ER.  Patient offered several PRN but refused.  Patient continues to need constant supervision and redirection on the unit.

## 2016-08-23 NOTE — Progress Notes (Signed)
Adult Psychoeducational Group Note  Date:  08/23/2016 Time:  8:49 PM  Group Topic/Focus:  Pt did not attend wrap-up group.

## 2016-08-24 ENCOUNTER — Inpatient Hospital Stay (HOSPITAL_COMMUNITY): Payer: Federal, State, Local not specified - Other

## 2016-08-24 ENCOUNTER — Encounter (HOSPITAL_COMMUNITY): Payer: Self-pay | Admitting: Emergency Medicine

## 2016-08-24 DIAGNOSIS — G4700 Insomnia, unspecified: Secondary | ICD-10-CM

## 2016-08-24 DIAGNOSIS — F17210 Nicotine dependence, cigarettes, uncomplicated: Secondary | ICD-10-CM

## 2016-08-24 DIAGNOSIS — F411 Generalized anxiety disorder: Secondary | ICD-10-CM

## 2016-08-24 DIAGNOSIS — R109 Unspecified abdominal pain: Secondary | ICD-10-CM

## 2016-08-24 LAB — COMPREHENSIVE METABOLIC PANEL
ALT: 58 U/L — AB (ref 14–54)
ANION GAP: 8 (ref 5–15)
AST: 35 U/L (ref 15–41)
Albumin: 4.2 g/dL (ref 3.5–5.0)
Alkaline Phosphatase: 60 U/L (ref 38–126)
BUN: 17 mg/dL (ref 6–20)
CHLORIDE: 106 mmol/L (ref 101–111)
CO2: 22 mmol/L (ref 22–32)
Calcium: 9.1 mg/dL (ref 8.9–10.3)
Creatinine, Ser: 0.98 mg/dL (ref 0.44–1.00)
Glucose, Bld: 122 mg/dL — ABNORMAL HIGH (ref 65–99)
POTASSIUM: 3.7 mmol/L (ref 3.5–5.1)
SODIUM: 136 mmol/L (ref 135–145)
Total Bilirubin: 0.2 mg/dL — ABNORMAL LOW (ref 0.3–1.2)
Total Protein: 6.7 g/dL (ref 6.5–8.1)

## 2016-08-24 LAB — CBC
HEMATOCRIT: 41 % (ref 36.0–46.0)
HEMOGLOBIN: 14.3 g/dL (ref 12.0–15.0)
MCH: 30.4 pg (ref 26.0–34.0)
MCHC: 34.9 g/dL (ref 30.0–36.0)
MCV: 87 fL (ref 78.0–100.0)
Platelets: 291 10*3/uL (ref 150–400)
RBC: 4.71 MIL/uL (ref 3.87–5.11)
RDW: 12.3 % (ref 11.5–15.5)
WBC: 10.2 10*3/uL (ref 4.0–10.5)

## 2016-08-24 LAB — URINALYSIS, ROUTINE W REFLEX MICROSCOPIC
BILIRUBIN URINE: NEGATIVE
Glucose, UA: NEGATIVE mg/dL
Hgb urine dipstick: NEGATIVE
KETONES UR: NEGATIVE mg/dL
Nitrite: NEGATIVE
PROTEIN: NEGATIVE mg/dL
Specific Gravity, Urine: 1.02 (ref 1.005–1.030)
pH: 5 (ref 5.0–8.0)

## 2016-08-24 LAB — POC URINE PREG, ED: Preg Test, Ur: NEGATIVE

## 2016-08-24 MED ORDER — ARIPIPRAZOLE 2 MG PO TABS
2.0000 mg | ORAL_TABLET | Freq: Two times a day (BID) | ORAL | Status: DC
  Administered 2016-08-25 – 2016-08-27 (×5): 2 mg via ORAL
  Filled 2016-08-24 (×8): qty 1

## 2016-08-24 MED ORDER — IOPAMIDOL (ISOVUE-300) INJECTION 61%
INTRAVENOUS | Status: AC
  Filled 2016-08-24: qty 100

## 2016-08-24 MED ORDER — DIPHENHYDRAMINE HCL 25 MG PO CAPS
50.0000 mg | ORAL_CAPSULE | Freq: Once | ORAL | Status: AC
  Administered 2016-08-24: 50 mg via ORAL
  Filled 2016-08-24: qty 2

## 2016-08-24 MED ORDER — IOPAMIDOL (ISOVUE-300) INJECTION 61%
100.0000 mL | Freq: Once | INTRAVENOUS | Status: AC | PRN
  Administered 2016-08-24: 100 mL via INTRAVENOUS

## 2016-08-24 NOTE — Treatment Plan (Signed)
Discussed in length with patient that she needed a mood stabilizer/antipsychotic to help with her ruminated thoughts in regards to her having physical complaints, her increasing anxiety. Patient willing to try Abilify 2 mg twice a day NP to do a physical exam on patient. Collateral obtained by nurse per mom, patient has a lot of psychosomatic symptoms, has had multiple workups which are negative.

## 2016-08-24 NOTE — Progress Notes (Signed)
1:1 Note Pt has been complaining of her right lower abdomen, pt assessed by NP and refered to the ED. Pt is on her way to the ED acompanied by staff, will continue to monitor.

## 2016-08-24 NOTE — Progress Notes (Signed)
1:1 Note: Pt at this time is in bed resting with eyes closed. Pt does not look to be in any distress at this time. 1:1 staff is present in room with Pt at this time. 1:1 monitoring continues for Pt's safety. 15-minute safety checks also continues at this time. 

## 2016-08-24 NOTE — Progress Notes (Signed)
1:1 Note Pt is in bed a sleep, pt has been sleeping all morning, will not get up for meds. No unwanted behavior observed or reported, staff remain with the pt' will continue to monitor.

## 2016-08-24 NOTE — ED Triage Notes (Signed)
Patient C/o RLQ pain that started "48 hours ago" with nausea.  Patient sent from [facility] by [facility] to be cleared for abd pain.

## 2016-08-24 NOTE — ED Notes (Signed)
Patient approached this nurse complaining of "itching all over" and "more pain in my chest." Patient states "my mom thinks I could be allergic to that IV stuff." No new rash noted upon inspection. PIV site remains clean and intact. Patient VSS. Patient lung sounds clear. MD notified. Awaiting new orders.

## 2016-08-24 NOTE — ED Notes (Signed)
Bed: WLPT1 Expected date:  Expected time:  Means of arrival:  Comments: 

## 2016-08-24 NOTE — ED Provider Notes (Signed)
[facility] DEPT Provider Note   CSN: [id number] Arrival date & time: 08/21/16  1301     History   Chief Complaint Chief Complaint  Patient presents with  . RLQ pain  . Nausea    HPI Beverly Torres is a 21 y.o. female.  The history is provided by the patient.  Abdominal Pain   This is a new problem. The current episode started 2 days ago. The problem occurs constantly. The problem has been gradually worsening. The pain is located in the RLQ and periumbilical region. The quality of the pain is sharp and pressure-like. The pain is moderate. Associated symptoms include hematochezia and nausea. Pertinent negatives include diarrhea, melena, vomiting, constipation and dysuria. The symptoms are aggravated by certain positions. Nothing relieves the symptoms.   LMP 08/03/16. Last sexual relation was one month ago. No vaginal bleeding or discharge.  Past Medical History:  Diagnosis Date  . Acid reflux   . Anxiety   . Anxiety   . Attention deficit disorder     Patient Active Problem List   Diagnosis Date Noted  . Anxiety disorder 08/21/2016  . Anxiety 05/02/2012  . ADHD (attention deficit hyperactivity disorder), combined type 03/28/2012    Past Surgical History:  Procedure Laterality Date  . ADENOIDECTOMY    . TONSILLECTOMY    . WISDOM TOOTH EXTRACTION      OB History    No data available       Home Medications    Prior to Admission medications   Medication Sig Start Date End Date Taking? Authorizing Provider  busPIRone (BUSPAR) 7.5 MG tablet Take 1 tablet (7.5 mg total) by mouth 2 (two) times daily. 08/03/16   Dorie Rank, MD  cephALEXin (KEFLEX) 500 MG capsule Take 1 capsule (500 mg total) by mouth 3 (three) times daily. 08/18/16 08/25/16  Gareth Morgan, MD  cetirizine (ZYRTEC) 10 MG tablet Take 1 tablet (10 mg total) by mouth daily. Patient not taking: Reported on 02/20/2016 06/12/15   Paretta-Leahey, Haze Boyden, NP  fluticasone (FLONASE) 50 MCG/ACT nasal spray Place 1  spray into both nostrils daily. Patient not taking: Reported on 08/03/2016 07/05/16   [name] [name] A, PA-C  hydrOXYzine (ATARAX/VISTARIL) 50 MG tablet Take 1 tablet (50 mg total) by mouth 3 (three) times daily as needed for anxiety. Patient not taking: Reported on 08/03/2016 06/12/15   Paretta-Leahey, Haze Boyden, NP  LORazepam (ATIVAN) 1 MG tablet Take 1 tablet (1 mg total) by mouth every 8 (eight) hours as needed for anxiety. 08/01/16   Antonietta Breach, PA-C  naproxen (NAPROSYN) 500 MG tablet Take 1 tablet (500 mg total) by mouth 2 (two) times daily. 08/20/16   Horton, Barbette Hair, MD  omeprazole (PRILOSEC) 20 MG capsule Take 1 capsule (20 mg total) by mouth 2 (two) times daily. 08/14/16   Tanna Furry, MD    Family History Family History  Problem Relation Age of Onset  . Anxiety disorder Mother   . Drug abuse Father   . ADD / ADHD Father   . ADD / ADHD Sister   . Anxiety disorder Sister   . ADD / ADHD Brother   . ODD Brother   . Rheum arthritis Maternal Grandmother   . Kidney failure Maternal Grandmother   . Hypertension Maternal Grandfather   . Diabetes Maternal Grandfather   . Arthritis Maternal Grandfather   . Cancer Maternal Grandfather        Melanoma  . Diabetes Paternal Grandfather     Social History Social  History  Substance Use Topics  . Smoking status: Current Every Day Smoker    Types: Cigarettes  . Smokeless tobacco: Never Used  . Alcohol use No     Allergies   Strawberry (diagnostic)   Review of Systems Review of Systems  Gastrointestinal: Positive for abdominal pain, hematochezia and nausea. Negative for constipation, diarrhea, melena and vomiting.  Genitourinary: Negative for dysuria.  All other systems are reviewed and are negative for acute change except as noted in the HPI   Physical Exam Updated Vital Signs BP 126/64 (BP Location: Left Arm)   Pulse (!) 107   Temp 98.7 F (37.1 C) (Oral)   Resp 18   Ht 5\' 2"  (1.575 m)   Wt 92.5 kg (204 lb)   LMP  08/03/2016 (Exact Date)   SpO2 99%   BMI 37.31 kg/m   Physical Exam  Constitutional: She is oriented to person, place, and time. She appears well-developed and well-nourished. No distress.  HENT:  Head: Normocephalic and atraumatic.  Nose: Nose normal.  Eyes: Pupils are equal, round, and reactive to light. Conjunctivae and EOM are normal. Right eye exhibits no discharge. Left eye exhibits no discharge. No scleral icterus.  Neck: Normal range of motion. Neck supple.  Cardiovascular: Normal rate and regular rhythm.  Exam reveals no gallop and no friction rub.   No murmur heard. Pulmonary/Chest: Effort normal and breath sounds normal. No stridor. No respiratory distress. She has no rales.  Abdominal: Soft. She exhibits no distension. There is tenderness in the right lower quadrant and periumbilical area. There is no rigidity, no rebound, no guarding and no CVA tenderness.  Musculoskeletal: She exhibits no edema or tenderness.  Neurological: She is alert and oriented to person, place, and time.  Skin: Skin is warm and dry. No rash noted. She is not diaphoretic. No erythema.  Psychiatric: She has a normal mood and affect.  Vitals reviewed.    ED Treatments / Results  Labs (all labs ordered are listed, but only abnormal results are displayed) Labs Reviewed  GLUCOSE, CAPILLARY - Abnormal; Notable for the following:       Result Value   Glucose-Capillary 116 (*)    All other components within normal limits  COMPREHENSIVE METABOLIC PANEL - Abnormal; Notable for the following:    Glucose, Bld 122 (*)    ALT 58 (*)    Total Bilirubin 0.2 (*)    All other components within normal limits  URINALYSIS, ROUTINE W REFLEX MICROSCOPIC - Abnormal; Notable for the following:    Leukocytes, UA LARGE (*)    Bacteria, UA RARE (*)    Squamous Epithelial / LPF 0-5 (*)    All other components within normal limits  TSH  LIPASE, BLOOD  AMYLASE  CBC  POC URINE PREG, ED  POC URINE PREG, ED     EKG  EKG Interpretation None       Radiology Ct Abdomen Pelvis W Contrast  Result Date: 08/24/2016 CLINICAL DATA:  Right lower quadrant pain that started 48 hours ago. EXAM: CT ABDOMEN AND PELVIS WITH CONTRAST TECHNIQUE: Multidetector CT imaging of the abdomen and pelvis was performed using the standard protocol following bolus administration of intravenous contrast. CONTRAST:  182mL ISOVUE-300 IOPAMIDOL (ISOVUE-300) INJECTION 61% COMPARISON:  None. FINDINGS: Lower chest:  Negative. Hepatobiliary: No focal liver abnormality.Hepatic steatosis. No evidence of biliary obstruction or stone. Pancreas: Unremarkable. Spleen: Unremarkable. Adrenals/Urinary Tract: Negative adrenals. No hydronephrosis or stone. Unremarkable bladder. Stomach/Bowel:  No obstruction. No appendicitis. Vascular/Lymphatic: No acute  vascular abnormality. No mass or adenopathy. Reproductive:No pathologic findings. Other: No ascites or pneumoperitoneum. Musculoskeletal: No acute abnormalities. 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 Slight anterolisthesis at L5-S1. IMPRESSION: 1. No acute finding.  No appendicitis. 2. Hepatic steatosis. 3. 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 Slight anterolisthesis at L5-S1. Electronically Signed   By: Monte Fantasia M.D.   On: 08/24/2016 19:41    Procedures Procedures (including critical care time)  Medications Ordered in ED Medications  alum & mag hydroxide-simeth (MAALOX/MYLANTA) 200-200-20 MG/5ML suspension 30 mL (30 mLs Oral Given 08/23/16 1306)  magnesium hydroxide (MILK OF MAGNESIA) suspension 30 mL (30 mLs Oral Given 08/23/16 1307)  traZODone (DESYREL) tablet 50 mg (50 mg Oral Given 08/23/16 2042)  albuterol (PROVENTIL HFA;VENTOLIN HFA) 108 (90 Base) MCG/ACT inhaler 2 puff (2 puffs Inhalation Given 08/24/16 1438)  cephALEXin (KEFLEX) capsule 500 mg (500 mg Oral Given 08/24/16 2107)  famotidine (PEPCID) tablet 20 mg (20 mg Oral Given 08/24/16 2107)   fluticasone (FLONASE) 50 MCG/ACT nasal spray 2 spray (2 sprays Each Nare Given 08/24/16 1202)  hydrOXYzine (ATARAX/VISTARIL) tablet 50 mg (50 mg Oral Given 08/23/16 1709)  loratadine (CLARITIN) tablet 10 mg (10 mg Oral Given 08/24/16 1200)  nystatin cream (MYCOSTATIN) ( Topical Given 08/24/16 1203)  LORazepam (ATIVAN) tablet 0.5 mg (0.5 mg Oral Given 08/24/16 2109)  nicotine polacrilex (NICORETTE) gum 2 mg (2 mg Oral Given 08/24/16 1553)  sertraline (ZOLOFT) tablet 50 mg (50 mg Oral Given 08/24/16 1202)  mupirocin cream (BACTROBAN) 2 % ( Topical Given 08/24/16 1202)  ibuprofen (ADVIL,MOTRIN) tablet 600 mg (600 mg Oral Given 08/24/16 1159)  ketorolac (TORADOL) tablet 10 mg (10 mg Oral Not Given 08/23/16 1708)  LORazepam (ATIVAN) tablet 1 mg (1 mg Oral Given 08/23/16 2042)    And  ziprasidone (GEODON) injection 10 mg (not administered)  diphenhydrAMINE (BENADRYL) injection 25 mg (25 mg Intramuscular Not Given 08/23/16 1715)  ARIPiprazole (ABILIFY) tablet 2 mg (not administered)  iopamidol (ISOVUE-300) 61 % injection (not administered)  diphenhydrAMINE (BENADRYL) 50 MG/ML injection (50 mg  Given 08/23/16 1646)  iopamidol (ISOVUE-300) 61 % injection 100 mL (100 mLs Intravenous Contrast Given 08/24/16 1923)  diphenhydrAMINE (BENADRYL) capsule 50 mg (50 mg Oral Given 08/24/16 2108)     Initial Impression / Assessment and Plan / ED Course  I have reviewed the triage vital signs and the nursing notes.  Pertinent labs & imaging results that were available during my care of the patient were reviewed by me and considered in my medical decision making (see chart for details).      right lower quadrant abdominal pain. Labs grossly reassuring. UA without evidence of infection. UPT negative. CT without evidence of pancreatitis or other inflammatory/infectious process. Patient denied any vaginal bleeding or discharge concerning for cervicitis/PID.  The patient is safe for transfer back to [facility].    Final Clinical  Impressions(s) / ED Diagnoses   Final diagnoses:  Right lower quadrant abdominal pain      Ridley Dileo, Grayce Sessions, MD 08/24/16 2323
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
## 2016-08-24 NOTE — Progress Notes (Signed)
Recreation Therapy Notes Date: 08/24/2016 Time: 10:00am Location: [address] Dayroom   Group Topic: Decision Making  Goal Area(s) Addresses:  Pts will successfully identify how their choices affect other people in their lives. Pts will successfully identify how to overcome an obstacle they may face when someone makes a poor choice around them.  Intervention: Game  Activity: Pts will work in teams to create as many words they can with the letters they chose. Pts will pick letters independently to use with their team for the first round and for the secound round pts will pick letters as a team.  Education: Decision Making, Discharge Planning  Education Outcome: Acknowledges understanding  Clinical Observations/Feedback: Pt did not attend group.  Donovan Kail, Recreation Therapy Intern  Victorino Sparrow, LRT/CTRS

## 2016-08-24 NOTE — ED Notes (Addendum)
Patient approached this nurse complaining of "pain when (she) breathes." MD at bedside- assessed patient. VSS. Patient cleared for transfer back to [facility]. PIV removed.

## 2016-08-24 NOTE — ED Notes (Signed)
Aspen called by this nurse to verify patient IVC paperwork remained at [facility] during transfer. Keidra in [facility] checked and stated that patient original IVC paperwork remains at [facility]

## 2016-08-24 NOTE — [facility] Group Notes (Signed)
[place] LCSW Group Therapy  08/24/2016 3:12 PM   Type of Therapy:  Group Therapy  Participation Level:  Active  Participation Quality:  Attentive  Affect:  Appropriate  Cognitive:  Appropriate  Insight:  Improving  Engagement in Therapy:  Engaged  Modes of Intervention:  Clarification, Education, Exploration and Socialization  Summary of Progress/Problems: Today's group focused on relapse prevention.  We defined the term, and then brainstormed on ways to prevent relapse. Invited.  Chose to not attend.  Roque Lias B 08/24/2016 , 3:12 PM

## 2016-08-25 MED ORDER — OLANZAPINE 5 MG PO TBDP
5.0000 mg | ORAL_TABLET | Freq: Four times a day (QID) | ORAL | Status: DC | PRN
  Administered 2016-08-26: 5 mg via ORAL
  Filled 2016-08-25: qty 1

## 2016-08-25 MED ORDER — OLANZAPINE 10 MG IM SOLR
5.0000 mg | Freq: Four times a day (QID) | INTRAMUSCULAR | Status: DC | PRN
  Administered 2016-08-25: 5 mg via INTRAMUSCULAR

## 2016-08-25 MED ORDER — OLANZAPINE 10 MG IM SOLR
INTRAMUSCULAR | Status: AC
  Filled 2016-08-25: qty 10
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
## 2016-08-25 NOTE — Progress Notes (Signed)
Patient placed on 1:1 and UR due to elopement risk.  It was explained to patient that she could not leave the unit and patient became increasingly agitated and attempted to run off the unit.  Patient was blocked by staff and she continued to escalate aggressive behaviors.  CIRT was called and medications given. Patient currently standing at door to the unit with 1:1 staff.

## 2016-08-25 NOTE — Progress Notes (Signed)
1:1 progress note:  Pt has been at the nurse's station all evening making somatic complaints about her chest, then her shoulder.  She has requested her vitals be taken multiple times.  She continues saying that she needs to be seen by the doctor.  She was sent to the ED yesterday and cleared.  Pt was reminded of the results of her ED trip yesterday, but she has not been satisfied, saying they did not do enough.  Writer requested the provider come to see pt, but he looked at her labs and the report of the ED doctor and said that her report was good.  Her vitals have been WNL.  As the evening has progressed, pt has become more relaxed, and not so focused on pain.  She continues on 1:1 for her unpredictable behaviors and continued insistence of being discharged tonight.  Sitter with patient.  Pt is safe at this time.

## 2016-08-25 NOTE — Progress Notes (Signed)
Patient currently in room resting 1:1 staff in close proximity to patient Patient has ceased all aggressive behaviors, but still insists that she is leaving the unit. Continue to monitor as planned.

## 2016-08-25 NOTE — Progress Notes (Signed)
Psychoeducational Group Note  Date:  08/25/2016 Time:  2056  Group Topic/Focus:  Wrap-Up Group:   The focus of this group is to help patients review their daily goal of treatment and discuss progress on daily workbooks.  Participation Level: Did Not Attend  Participation Quality:  Not Applicable  Affect:  Not Applicable  Cognitive:  Not Applicable  Insight:  Not Applicable  Engagement in Group: Not Applicable  Additional Comments:  The patient did not attend group since she was out in the hallway pacing back and forth.   Archie Balboa S 08/25/2016, 8:56 PM

## 2016-08-25 NOTE — Progress Notes (Signed)
1:1 Note  Pt back on the unit from the ED accompanied by [employer] and 1:1 tech at about 2340. Pt independently ambulated on to the unit with a smile on her face; "I feel better now." Denied anxiety, depression, pain AVH SI or HI. Pt had a diagnosis of RLQ abdominal pain.  Pt at this time is in bed resting with eyes closed. Pt does not look to be in any distress at this time. 1:1 staff is present in room with Pt at this time. 1:1 monitoring continues for Pt's safety. 15-minute safety checks also continues at this time.

## 2016-08-25 NOTE — Progress Notes (Signed)
Recreation Therapy Notes   Date: 08/25/2016 Time: 10:00am Location: 500 [address] Dayroom  Group Topic: Problem-Solving  Goal Area(s) Addresses:  Pts will successfully identify problems they are currently facing. Pts will successfully offer solutions to anonymous problems.  Intervention:  Pencils Printer Paper  Activity: Pts were asked to write down a current problem they are trying to solve. Pts were then given other peoples papers and asked to offer solutions for someone else problem.  Education: Problem-Solving, Discharge Planning  Education Outcome: Acknowledges understanding  Clinical Observations/Feedback: Pt did not attend group.  Donovan Kail, Recreation Therapy Intern  Victorino Sparrow, LRT/CTRS

## 2016-08-25 NOTE — [facility] Group Notes (Signed)
[facility] Group Notes:  (Counselor/Nursing/MHT/Case Management/Adjunct)  08/25/2016 1:15PM  Type of Therapy:  Group Therapy  Participation Level:  Active  Participation Quality:  Appropriate  Affect:  Flat  Cognitive:  Oriented  Insight:  Improving  Engagement in Group:  Limited  Engagement in Therapy:  Limited  Modes of Intervention:  Discussion, Exploration and Socialization  Summary of Progress/Problems: The topic for group was balance in life.  Pt participated in the discussion about when their life was in balance and out of balance and how this feels.  Pt discussed ways to get back in balance and short term goals they can work on to get where they want to be. Invited.  Chose to not attend.   Trish Mage 08/25/2016 3:49 PM

## 2016-08-26 DIAGNOSIS — F459 Somatoform disorder, unspecified: Secondary | ICD-10-CM

## 2016-08-26 DIAGNOSIS — F39 Unspecified mood [affective] disorder: Secondary | ICD-10-CM

## 2016-08-26 MED ORDER — SERTRALINE HCL 100 MG PO TABS
100.0000 mg | ORAL_TABLET | Freq: Every day | ORAL | Status: DC
  Administered 2016-08-27: 100 mg via ORAL
  Filled 2016-08-26: qty 1
  Filled 2016-08-26: qty 2
  Filled 2016-08-26: qty 1

## 2016-08-26 MED ORDER — GABAPENTIN 100 MG PO CAPS
100.0000 mg | ORAL_CAPSULE | Freq: Two times a day (BID) | ORAL | Status: DC
  Administered 2016-08-26 – 2016-08-27 (×2): 100 mg via ORAL
  Filled 2016-08-26 (×6): qty 1

## 2016-08-26 MED ORDER — METHOCARBAMOL 500 MG PO TABS
500.0000 mg | ORAL_TABLET | Freq: Three times a day (TID) | ORAL | Status: DC | PRN
  Administered 2016-08-26: 500 mg via ORAL
  Filled 2016-08-26: qty 1

## 2016-08-26 NOTE — Tx Team (Signed)
Interdisciplinary Treatment and Diagnostic Plan Update  08/26/2016 Time of Session: 4:11 PM  Alisah [facility] MRN: [id number]  Principal Diagnosis: Anxiety disorder  Secondary Diagnoses: Principal Problem:   Anxiety disorder   Current Medications:  Current Facility-Administered Medications  Medication Dose Route Frequency Provider Last Rate Last Dose  . albuterol (PROVENTIL HFA;VENTOLIN HFA) 108 (90 Base) MCG/ACT inhaler 2 puff  2 puff Inhalation Q4H PRN Okonkwo, Justina A, NP   2 puff at 08/26/16 1226  . alum & mag hydroxide-simeth (MAALOX/MYLANTA) 200-200-20 MG/5ML suspension 30 mL  30 mL Oral Q4H PRN Okonkwo, Justina A, NP   30 mL at 08/23/16 1306  . ARIPiprazole (ABILIFY) tablet 2 mg  2 mg Oral BID Hampton Abbot, MD   2 mg at 08/26/16 1608  . cephALEXin (KEFLEX) capsule 500 mg  500 mg Oral TID Lu Duffel, Justina A, NP   500 mg at 08/26/16 1608  . diphenhydrAMINE (BENADRYL) injection 25 mg  25 mg Intramuscular Once Derrill Center, NP      . famotidine (PEPCID) tablet 20 mg  20 mg Oral BID Okonkwo, Justina A, NP   20 mg at 08/26/16 1609  . fluticasone (FLONASE) 50 MCG/ACT nasal spray 2 spray  2 spray Each Nare Daily Okonkwo, Justina A, NP   2 spray at 08/25/16 1053  . gabapentin (NEURONTIN) capsule 100 mg  100 mg Oral BID Ambrose Finland, MD   100 mg at 08/26/16 1608  . hydrOXYzine (ATARAX/VISTARIL) tablet 50 mg  50 mg Oral TID PRN Lu Duffel, Justina A, NP   50 mg at 08/26/16 1434  . ibuprofen (ADVIL,MOTRIN) tablet 600 mg  600 mg Oral BID PRN Hampton Abbot, MD   600 mg at 08/26/16 1434  . ketorolac (TORADOL) tablet 10 mg  10 mg Oral Once Derrill Center, NP      . loratadine (CLARITIN) tablet 10 mg  10 mg Oral Daily Okonkwo, Justina A, NP   10 mg at 08/26/16 0803  . LORazepam (ATIVAN) tablet 0.5 mg  0.5 mg Oral Q6H PRN Cobos, Myer Peer, MD   0.5 mg at 08/26/16 1434  . magnesium hydroxide (MILK OF MAGNESIA) suspension 30 mL  30 mL Oral Daily PRN Okonkwo, Justina A, NP   30 mL at  08/23/16 1307  . methocarbamol (ROBAXIN) tablet 500 mg  500 mg Oral Q8H PRN Lindell Spar I, NP   500 mg at 08/26/16 1608  . mupirocin cream (BACTROBAN) 2 %   Topical BID Derrill Center, NP      . nicotine polacrilex (NICORETTE) gum 2 mg  2 mg Oral PRN Derrill Center, NP   2 mg at 08/26/16 1201  . nystatin cream (MYCOSTATIN)   Topical BID Nanci Pina, FNP      . OLANZapine zydis (ZYPREXA) disintegrating tablet 5 mg  5 mg Oral Q6H PRN Lindell Spar I, NP   5 mg at 08/26/16 1434   Or  . OLANZapine (ZYPREXA) injection 5 mg  5 mg Intramuscular Q6H PRN Lindell Spar I, NP   5 mg at 08/25/16 1515  . [START ON 08/27/2016] sertraline (ZOLOFT) tablet 100 mg  100 mg Oral Daily Ambrose Finland, MD      . traZODone (DESYREL) tablet 50 mg  50 mg Oral QHS PRN Okonkwo, Justina A, NP   50 mg at 08/25/16 2225  . ziprasidone (GEODON) injection 10 mg  10 mg Intramuscular Once PRN Derrill Center, NP        PTA Medications: Prescriptions Prior  to Admission  Medication Sig Dispense Refill Last Dose  . busPIRone (BUSPAR) 7.5 MG tablet Take 1 tablet (7.5 mg total) by mouth 2 (two) times daily. 60 tablet 0 Past Week at Unknown time  . [EXPIRED] cephALEXin (KEFLEX) 500 MG capsule Take 1 capsule (500 mg total) by mouth 3 (three) times daily. 21 capsule 0 not started  . cetirizine (ZYRTEC) 10 MG tablet Take 1 tablet (10 mg total) by mouth daily. (Patient not taking: Reported on 02/20/2016) 30 tablet 6 Completed Course at Unknown time  . fluticasone (FLONASE) 50 MCG/ACT nasal spray Place 1 spray into both nostrils daily. (Patient not taking: Reported on 08/03/2016) 16 g 2 Completed Course at Unknown time  . hydrOXYzine (ATARAX/VISTARIL) 50 MG tablet Take 1 tablet (50 mg total) by mouth 3 (three) times daily as needed for anxiety. (Patient not taking: Reported on 08/03/2016) 90 tablet 2 Completed Course at Unknown time  . LORazepam (ATIVAN) 1 MG tablet Take 1 tablet (1 mg total) by mouth every 8 (eight) hours as  needed for anxiety. 12 tablet 0 Past Week at Unknown time  . naproxen (NAPROSYN) 500 MG tablet Take 1 tablet (500 mg total) by mouth 2 (two) times daily. 30 tablet 0   . omeprazole (PRILOSEC) 20 MG capsule Take 1 capsule (20 mg total) by mouth 2 (two) times daily. 60 capsule 0 Past Week at Unknown time    Patient Stressors: Financial difficulties Health problems  Patient Strengths: Communication skills  Treatment Modalities: Medication Management, Group therapy, Case management,  1 to 1 session with clinician, Psychoeducation, Recreational therapy.   Physician Treatment Plan for Primary Diagnosis: Anxiety disorder Long Term Goal(s): Improvement in symptoms so as ready for discharge  Short Term Goals: Ability to identify changes in lifestyle to reduce recurrence of condition will improve Ability to verbalize feelings will improve Ability to identify and develop effective coping behaviors will improve Ability to demonstrate self-control will improve Ability to identify and develop effective coping behaviors will improve Ability to identify triggers associated with substance abuse/mental health issues will improve  Medication Management: Evaluate patient's response, side effects, and tolerance of medication regimen.  Therapeutic Interventions: 1 to 1 sessions, Unit Group sessions and Medication administration.  Evaluation of Outcomes: Progressing   7/19:  Patient continues to be anxious, isolated, withdrawn with unrelenting psychosomatic complaints  Continue Abilify 2 mg bid for mood control. Start Gabapentin 100 mg BID for anxiety. Increase Zoloft 100 mg for depression. Continue Trazodone 50 mg for insomnia. Continue the antibiotic therapy as recommended. Encouraged to participate in therapeutic milieu and group therapy sessions Continued the 1:1 supervision as she is trying to elope from the unit.  Physician Treatment Plan for Secondary Diagnosis: Principal Problem:   Anxiety  disorder   Long Term Goal(s): Improvement in symptoms so as ready for discharge  Short Term Goals: Ability to identify changes in lifestyle to reduce recurrence of condition will improve Ability to verbalize feelings will improve Ability to identify and develop effective coping behaviors will improve Ability to demonstrate self-control will improve Ability to identify and develop effective coping behaviors will improve Ability to identify triggers associated with substance abuse/mental health issues will improve  Medication Management: Evaluate patient's response, side effects, and tolerance of medication regimen.  Therapeutic Interventions: 1 to 1 sessions, Unit Group sessions and Medication administration.  Evaluation of Outcomes: Progressing   RN Treatment Plan for Primary Diagnosis: Anxiety disorder Long Term Goal(s): Knowledge of disease and therapeutic regimen to maintain health will improve  Short Term Goals: Ability to demonstrate self-control and Ability to identify and develop effective coping behaviors will improve  Medication Management: RN will administer medications as ordered by provider, will assess and evaluate patient's response and provide education to patient for prescribed medication. RN will report any adverse and/or side effects to prescribing provider.  Therapeutic Interventions: 1 on 1 counseling sessions, Psychoeducation, Medication administration, Evaluate responses to treatment, Monitor vital signs and CBGs as ordered, Perform/monitor CIWA, COWS, AIMS and Fall Risk screenings as ordered, Perform wound care treatments as ordered.  Evaluation of Outcomes: Progressing   Recreational Therapy Treatment Plan for Primary Diagnosis: Anxiety disorder Long Term Goal(s): Patient will participate in recreation therapy treatment in at least 2 group sessions without prompting from LRT  Short Term Goals: Patient will demonstrate increased ability to follow instructions,  as demonstrated by ability to follow LRT instructions on first prompt during recreation therapy group sessions  Treatment Modalities: Group and Pet Therapy  Therapeutic Interventions: Psychoeducation  Evaluation of Outcomes: Progressing  LCSW Treatment Plan for Primary Diagnosis: Anxiety disorder Long Term Goal(s): Safe transition to appropriate next level of care at discharge, Engage patient in therapeutic group addressing interpersonal concerns.  Short Term Goals: Engage patient in aftercare planning with referrals and resources  Therapeutic Interventions: Assess for all discharge needs, 1 to 1 time with Social worker, Explore available resources and support systems, Assess for adequacy in community support network, Educate family and significant other(s) on suicide prevention, Complete Psychosocial Assessment, Interpersonal group therapy.  Evaluation of Outcomes: Met  Return home with mother, follow up [facility]   Progress in Treatment: Attending groups: No Participating in groups: No Taking medication as prescribed: Yes Toleration medication: Yes, no side effects reported at this time Family/Significant other contact made: Yes Patient understands diagnosis: No Somatically focused Discussing patient identified problems/goals with staff: Yes Medical problems stabilized or resolved: Yes Denies suicidal/homicidal ideation: Yes Issues/concerns per patient self-inventory: None Other: N/A  New problem(s) identified: None identified at this time.   New Short Term/Long Term Goal(s): None identified at this time.   Discharge Plan or Barriers:   Reason for Continuation of Hospitalization: Anxiety Delusions  Depression Medication stabilization   Estimated Length of Stay: 3-5 days  Attendees: Patient: 08/26/2016  4:11 PM  Physician: ,Ambrose Finland MD 08/26/2016  4:11 PM  Nursing: Sena Hitch, RN 08/26/2016  4:11 PM  RN Care Manager: Lars Pinks, RN 08/26/2016   4:11 PM  Social Worker: Ripley Fraise 08/26/2016  4:11 PM  Recreational Therapist: Victorino Sparrow, LRT/CTRS 08/26/2016  4:11 PM  Other: Norberto Sorenson 08/26/2016  4:11 PM  Other:  08/26/2016  4:11 PM    Scribe for Treatment Team:  Roque Lias LCSW 08/26/2016 4:11 PM

## 2016-08-26 NOTE — Progress Notes (Signed)
Pt is up and in the dayroom getting her vital signs checked.  She is again c/o pain in her chest and is hold her upper chest.  She says it is worse than yesterday.  Her vital signs are within normal limits.  Pt was given Ibuprofen 600 mg and Ativan 0.5 mg for anxiety and her pain this morning.  She continues focused on her somatic complaints and when writer tries to explain reasons why she may be having pain, she is insistent that it is her heart.  She continues on 1:1 for safety.  Sitter with patient. Pt remains safe at this time.

## 2016-08-26 NOTE — Progress Notes (Signed)
Recreation Therapy Notes  Date: 08/26/2016 Time: 10:00am Location: [address] Dayroom  Group Topic: Leisure Education  Goal Area(s) Addresses:  Pt will successfully identify three positive leisure activities. Pt will successfully identify benefits of participating in leisure activities.  Intervention: Game  Activity: Pts will work together to identify leisure activities that begin with each letter of the alphabet.  Education: Leisure Education, Dentist  Education Outcome: Acknowledges understanding   Clinical Observations/Feedback: Pt did not attend group.  Donovan Kail, Recreation Therapy Intern  Victorino Sparrow, LRT/CTRS

## 2016-08-26 NOTE — Progress Notes (Signed)
Patient did not attend Karaoke.

## 2016-08-26 NOTE — Progress Notes (Signed)
1:1 progress note:  Pt is resting in bed with eyes closed.  No distress observed.  Respirations even and unlabored.  Continue 1:1 for safety d/t pt's unpredictable behaviors.  Sitter at bedside.  Pt safe at this time.

## 2016-08-26 NOTE — Progress Notes (Signed)
Patient has been monitored 1:1 this shift.  Patient has had no behavioral dyscontrol this shift.  Patient denies SI, HI, and AVH this shift.  Patient complains of pain in multiple sites. Patient offered comfort measures and has found some relief for pain.
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
## 2016-08-26 NOTE — [facility] Group Notes (Signed)
[facility] Mental Health Association Group Therapy  08/26/2016 , 1:05 PM    Type of Therapy:  Mental Health Association Presentation  Participation Level:  Active  Participation Quality:  Attentive  Affect:  Blunted  Cognitive:  Oriented  Insight:  Limited  Engagement in Therapy:  Engaged  Modes of Intervention:  Discussion, Education and Socialization  Summary of Progress/Problems:  Shanon Brow from [facility] came to present his recovery story and play the guitar.  Invited.  Chose to not aatend.  Roque Lias B 08/26/2016 , 1:05 PM

## 2016-08-26 NOTE — Progress Notes (Signed)
1:1 Note @ 2200  Next note @ 0200   D:Pt had just woke up from a nap. Pt requested snacks and fluid; both given. PRN's requested and given. Pt was anxious but cooperative.  A:1:1 observation continues for safety. Sitter remains arms reach.  R:Pt remains safe

## 2016-08-26 NOTE — Plan of Care (Signed)
Problem: Activity: Goal: Interest or engagement in activities will improve Outcome: Not Progressing Pt. did not attend group this evening.

## 2016-08-26 NOTE — Progress Notes (Signed)
  DATA ACTION RESPONSE  Objective- Pt. is visible in the dayroom, seen eating a snack. Presents with an anxious affect and mood. C/o of chest pain and holds upper chest. Vitals WDL. No abnormal s/s. Appears to be preoccupied with somatic c/o's.  Subjective- Denies having any SI/HI/AVH at this time.  Is cooperative and remain safe on the unit.  1:1 interaction in private to establish rapport. Encouragement, education, & support given from staff.  PRN Ativan, Vistaril, and Trazodone requested and will re-eval accordingly.   Safety maintained with Q 15 checks. Continue with POC.

## 2016-08-27 ENCOUNTER — Emergency Department (HOSPITAL_COMMUNITY): Payer: Medicaid Other

## 2016-08-27 ENCOUNTER — Encounter (HOSPITAL_COMMUNITY): Payer: Self-pay | Admitting: Emergency Medicine

## 2016-08-27 ENCOUNTER — Emergency Department (HOSPITAL_COMMUNITY)
Admission: EM | Admit: 2016-08-27 | Discharge: 2016-08-27 | Disposition: A | Discharge status: Home / Self Care | Payer: Medicaid Other | Attending: Emergency Medicine | Admitting: Emergency Medicine

## 2016-08-27 DIAGNOSIS — R443 Hallucinations, unspecified: Secondary | ICD-10-CM

## 2016-08-27 DIAGNOSIS — Z811 Family history of alcohol abuse and dependence: Secondary | ICD-10-CM

## 2016-08-27 DIAGNOSIS — R079 Chest pain, unspecified: Secondary | ICD-10-CM

## 2016-08-27 DIAGNOSIS — F17210 Nicotine dependence, cigarettes, uncomplicated: Secondary | ICD-10-CM | POA: Insufficient documentation

## 2016-08-27 DIAGNOSIS — R0789 Other chest pain: Secondary | ICD-10-CM | POA: Insufficient documentation

## 2016-08-27 DIAGNOSIS — F419 Anxiety disorder, unspecified: Secondary | ICD-10-CM | POA: Insufficient documentation

## 2016-08-27 DIAGNOSIS — F203 Undifferentiated schizophrenia: Secondary | ICD-10-CM | POA: Diagnosis present

## 2016-08-27 DIAGNOSIS — F902 Attention-deficit hyperactivity disorder, combined type: Secondary | ICD-10-CM | POA: Insufficient documentation

## 2016-08-27 DIAGNOSIS — Z79899 Other long term (current) drug therapy: Secondary | ICD-10-CM | POA: Insufficient documentation

## 2016-08-27 LAB — I-STAT CHEM 8, ED
BUN: 18 mg/dL (ref 6–20)
CREATININE: 1 mg/dL (ref 0.44–1.00)
Calcium, Ion: 1.17 mmol/L (ref 1.15–1.40)
Chloride: 103 mmol/L (ref 101–111)
Glucose, Bld: 86 mg/dL (ref 65–99)
HEMATOCRIT: 48 % — AB (ref 36.0–46.0)
Hemoglobin: 16.3 g/dL — ABNORMAL HIGH (ref 12.0–15.0)
POTASSIUM: 4.3 mmol/L (ref 3.5–5.1)
Sodium: 141 mmol/L (ref 135–145)
TCO2: 24 mmol/L (ref 0–100)

## 2016-08-27 LAB — I-STAT TROPONIN, ED: Troponin i, poc: 0 ng/mL (ref 0.00–0.08)

## 2016-08-27 MED ORDER — ARIPIPRAZOLE 2 MG PO TABS: 2.0000 mg | ORAL_TABLET | Freq: Two times a day (BID) | ORAL | 0 refills | Status: DC

## 2016-08-27 MED ORDER — MUPIROCIN CALCIUM 2 % EX CREA: TOPICAL_CREAM | Freq: Two times a day (BID) | CUTANEOUS | 0 refills | Status: DC

## 2016-08-27 MED ORDER — FLUTICASONE PROPIONATE 50 MCG/ACT NA SUSP: 1.0000 | Freq: Every day | NASAL | 0 refills | Status: DC

## 2016-08-27 MED ORDER — GABAPENTIN 100 MG PO CAPS: 100.0000 mg | ORAL_CAPSULE | Freq: Two times a day (BID) | ORAL | 0 refills | Status: DC

## 2016-08-27 MED ORDER — ARIPIPRAZOLE ER 400 MG IM SRER
400.0000 mg | Freq: Once | INTRAMUSCULAR | Status: AC
  Administered 2016-08-27: 400 mg via INTRAMUSCULAR

## 2016-08-27 MED ORDER — LORAZEPAM 1 MG PO TABS: 1.0000 mg | ORAL_TABLET | Freq: Three times a day (TID) | ORAL | 0 refills | Status: DC | PRN

## 2016-08-27 MED ORDER — ARIPIPRAZOLE ER 400 MG IM SRER: 400.0000 mg | Freq: Once | INTRAMUSCULAR | 1 refills | Status: DC

## 2016-08-27 MED ORDER — CETIRIZINE HCL 10 MG PO TABS: 10.0000 mg | ORAL_TABLET | Freq: Every day | ORAL | 0 refills | Status: DC

## 2016-08-27 MED ORDER — TRAZODONE HCL 50 MG PO TABS: 50.0000 mg | ORAL_TABLET | Freq: Every evening | ORAL | 0 refills | Status: DC | PRN

## 2016-08-27 MED ORDER — ACETAMINOPHEN 500 MG PO TABS
1000.0000 mg | ORAL_TABLET | Freq: Once | ORAL | Status: AC
  Administered 2016-08-27: 1000 mg via ORAL
  Filled 2016-08-27: qty 2

## 2016-08-27 MED ORDER — FAMOTIDINE 20 MG PO TABS: 20.0000 mg | ORAL_TABLET | Freq: Two times a day (BID) | ORAL | 0 refills | Status: DC

## 2016-08-27 MED ORDER — SERTRALINE HCL 100 MG PO TABS: 100.0000 mg | ORAL_TABLET | Freq: Every day | ORAL | 0 refills | Status: DC

## 2016-08-27 MED ORDER — ALBUTEROL SULFATE HFA 108 (90 BASE) MCG/ACT IN AERS: 2.0000 | INHALATION_SPRAY | RESPIRATORY_TRACT | Status: DC | PRN

## 2016-08-27 MED ORDER — NICOTINE POLACRILEX 2 MG MT GUM: 2.0000 mg | CHEWING_GUM | OROMUCOSAL | 0 refills | Status: DC | PRN

## 2016-08-27 MED ORDER — HYDROXYZINE HCL 50 MG PO TABS: 50.0000 mg | ORAL_TABLET | Freq: Three times a day (TID) | ORAL | 0 refills | Status: DC | PRN

## 2016-08-27 MED ORDER — NYSTATIN 100000 UNIT/GM EX CREA: TOPICAL_CREAM | Freq: Two times a day (BID) | CUTANEOUS | 0 refills | Status: DC

## 2016-08-27 NOTE — Progress Notes (Signed)
1:1 Note @ 0600 Next note @ 2200  D:Pt is observed laying in bed with eyes closed. Respirations even and unlabored. No distress noted.  A:1:1 observation continues for safety. Sitter remains arms reach.  R:Pt remains safe

## 2016-08-27 NOTE — Plan of Care (Signed)
Problem: [facility] Participation in Recreation Therapeutic Interventions Goal: STG-Patient will attend/participate in Rec Therapy Group Ses STG-The Patient will attend and participate in Recreation Therapy Group Sessions  Outcome: Not Met (add Reason) Pt did not reach her goal due to not wanting to attend or participate in recreation therapy group sessions.  Donovan Kail, Recreation Therapy Intern

## 2016-08-27 NOTE — Progress Notes (Signed)
  [facility] Adult Case Management Discharge Plan :  Will you be returning to the same living situation after discharge:  Yes,  home with mother At discharge, do you have transportation home?: Yes,  mother Do you have the ability to pay for your medications: Yes,  mental health  Release of information consent forms completed and in the chart;  Patient's signature needed at discharge.  Patient to Follow up at: Follow-up Information    [facility] Follow up on 09/07/2016.   Specialty:  Professional Counselor Why:  Tuesday at 3:00 with Marliss Coots Contact information: [place] [phone]           Next level of care provider has access to [place] and Suicide Prevention discussed: Yes,  yes  Have you used any form of tobacco in the last 30 days? (Cigarettes, Smokeless Tobacco, Cigars, and/or Pipes): No  Has patient been referred to the Quitline?: N/A patient is not a smoker  Patient has been referred for addiction treatment: [place] 08/27/2016, 10:16 AM

## 2016-08-27 NOTE — Progress Notes (Signed)
1:1 Note @ 0200 Next note @ 0600  D:Pt is observed laying in bed with eyes closed. Respirations even and unlabored. No distress noted.  A:1:1 observation continues for safety. Sitter remains arms reach.  R:Pt remains safe

## 2016-08-27 NOTE — ED Triage Notes (Signed)
Pt arrives via [facility] reporting central CP radiating to back x 2 hours, denies SOB.  Pt reports being discharged from [facility] 2 hours ago, reports hx anxiety.  Resp e/u at this time, NAD noted.

## 2016-08-27 NOTE — Tx Team (Signed)
Interdisciplinary Treatment and Diagnostic Plan Update  08/27/2016 Time of Session: 10:11 AM  Beverly Torres MRN: [id number]  Principal Diagnosis: Anxiety disorder  Secondary Diagnoses: Principal Problem:   Anxiety disorder   Current Medications:  Current Facility-Administered Medications  Medication Dose Route Frequency Provider Last Rate Last Dose  . albuterol (PROVENTIL HFA;VENTOLIN HFA) 108 (90 Base) MCG/ACT inhaler 2 puff  2 puff Inhalation Q4H PRN Okonkwo, Justina A, NP   2 puff at 08/26/16 1226  . alum & mag hydroxide-simeth (MAALOX/MYLANTA) 200-200-20 MG/5ML suspension 30 mL  30 mL Oral Q4H PRN Okonkwo, Justina A, NP   30 mL at 08/23/16 1306  . ARIPiprazole (ABILIFY) tablet 2 mg  2 mg Oral BID Hampton Abbot, MD   2 mg at 08/27/16 0914  . cephALEXin (KEFLEX) capsule 500 mg  500 mg Oral TID Lu Duffel, Justina A, NP   500 mg at 08/27/16 0915  . diphenhydrAMINE (BENADRYL) injection 25 mg  25 mg Intramuscular Once Derrill Center, NP      . famotidine (PEPCID) tablet 20 mg  20 mg Oral BID Okonkwo, Justina A, NP   20 mg at 08/27/16 0914  . fluticasone (FLONASE) 50 MCG/ACT nasal spray 2 spray  2 spray Each Nare Daily Okonkwo, Justina A, NP   2 spray at 08/25/16 1053  . gabapentin (NEURONTIN) capsule 100 mg  100 mg Oral BID Ambrose Finland, MD   100 mg at 08/27/16 0913  . hydrOXYzine (ATARAX/VISTARIL) tablet 50 mg  50 mg Oral TID PRN Hughie Closs A, NP   50 mg at 08/26/16 2220  . ibuprofen (ADVIL,MOTRIN) tablet 600 mg  600 mg Oral BID PRN Hampton Abbot, MD   600 mg at 08/27/16 0949  . ketorolac (TORADOL) tablet 10 mg  10 mg Oral Once Derrill Center, NP      . loratadine (CLARITIN) tablet 10 mg  10 mg Oral Daily Okonkwo, Justina A, NP   10 mg at 08/27/16 0915  . LORazepam (ATIVAN) tablet 0.5 mg  0.5 mg Oral Q6H PRN Cobos, Myer Peer, MD   0.5 mg at 08/26/16 2220  . magnesium hydroxide (MILK OF MAGNESIA) suspension 30 mL  30 mL Oral Daily PRN Okonkwo, Justina A, NP   30 mL at  08/23/16 1307  . methocarbamol (ROBAXIN) tablet 500 mg  500 mg Oral Q8H PRN Lindell Spar I, NP   500 mg at 08/26/16 1608  . mupirocin cream (BACTROBAN) 2 %   Topical BID Derrill Center, NP      . nicotine polacrilex (NICORETTE) gum 2 mg  2 mg Oral PRN Derrill Center, NP   2 mg at 08/27/16 0950  . nystatin cream (MYCOSTATIN)   Topical BID Nanci Pina, FNP      . OLANZapine zydis (ZYPREXA) disintegrating tablet 5 mg  5 mg Oral Q6H PRN Lindell Spar I, NP   5 mg at 08/26/16 1434   Or  . OLANZapine (ZYPREXA) injection 5 mg  5 mg Intramuscular Q6H PRN Lindell Spar I, NP   5 mg at 08/25/16 1515  . sertraline (ZOLOFT) tablet 100 mg  100 mg Oral Daily Ambrose Finland, MD   100 mg at 08/27/16 0913  . traZODone (DESYREL) tablet 50 mg  50 mg Oral QHS PRN Okonkwo, Justina A, NP   50 mg at 08/26/16 2220  . ziprasidone (GEODON) injection 10 mg  10 mg Intramuscular Once PRN Derrill Center, NP        PTA Medications: Prescriptions Prior  to Admission  Medication Sig Dispense Refill Last Dose  . busPIRone (BUSPAR) 7.5 MG tablet Take 1 tablet (7.5 mg total) by mouth 2 (two) times daily. 60 tablet 0 Past Week at Unknown time  . [EXPIRED] cephALEXin (KEFLEX) 500 MG capsule Take 1 capsule (500 mg total) by mouth 3 (three) times daily. 21 capsule 0 not started  . hydrOXYzine (ATARAX/VISTARIL) 50 MG tablet Take 1 tablet (50 mg total) by mouth 3 (three) times daily as needed for anxiety. (Patient not taking: Reported on 08/03/2016) 90 tablet 2 Completed Course at Unknown time  . naproxen (NAPROSYN) 500 MG tablet Take 1 tablet (500 mg total) by mouth 2 (two) times daily. 30 tablet 0   . omeprazole (PRILOSEC) 20 MG capsule Take 1 capsule (20 mg total) by mouth 2 (two) times daily. 60 capsule 0 Past Week at Unknown time  . [DISCONTINUED] cetirizine (ZYRTEC) 10 MG tablet Take 1 tablet (10 mg total) by mouth daily. (Patient not taking: Reported on 02/20/2016) 30 tablet 6 Completed Course at Unknown time  .  [DISCONTINUED] fluticasone (FLONASE) 50 MCG/ACT nasal spray Place 1 spray into both nostrils daily. (Patient not taking: Reported on 08/03/2016) 16 g 2 Completed Course at Unknown time  . [DISCONTINUED] LORazepam (ATIVAN) 1 MG tablet Take 1 tablet (1 mg total) by mouth every 8 (eight) hours as needed for anxiety. 12 tablet 0 Past Week at Unknown time    Patient Stressors: Financial difficulties Health problems  Patient Strengths: Communication skills  Treatment Modalities: Medication Management, Group therapy, Case management,  1 to 1 session with clinician, Psychoeducation, Recreational therapy.   Physician Treatment Plan for Primary Diagnosis: Anxiety disorder Long Term Goal(s): Improvement in symptoms so as ready for discharge  Short Term Goals: Ability to identify changes in lifestyle to reduce recurrence of condition will improve Ability to verbalize feelings will improve Ability to identify and develop effective coping behaviors will improve Ability to demonstrate self-control will improve Ability to identify and develop effective coping behaviors will improve Ability to identify triggers associated with substance abuse/mental health issues will improve  Medication Management: Evaluate patient's response, side effects, and tolerance of medication regimen.  Therapeutic Interventions: 1 to 1 sessions, Unit Group sessions and Medication administration.  Evaluation of Outcomes: Adequate for Discharge   7/19:  Patient continues to be anxious, isolated, withdrawn with unrelenting psychosomatic complaints  Continue Abilify 2 mg bid for mood control. Start Gabapentin 100 mg BID for anxiety. Increase Zoloft 100 mg for depression. Continue Trazodone 50 mg for insomnia. Continue the antibiotic therapy as recommended. Encouraged to participate in therapeutic milieu and group therapy sessions Continued the 1:1 supervision as she is trying to elope from the unit.  Physician Treatment  Plan for Secondary Diagnosis: Principal Problem:   Anxiety disorder   Long Term Goal(s): Improvement in symptoms so as ready for discharge  Short Term Goals: Ability to identify changes in lifestyle to reduce recurrence of condition will improve Ability to verbalize feelings will improve Ability to identify and develop effective coping behaviors will improve Ability to demonstrate self-control will improve Ability to identify and develop effective coping behaviors will improve Ability to identify triggers associated with substance abuse/mental health issues will improve  Medication Management: Evaluate patient's response, side effects, and tolerance of medication regimen.  Therapeutic Interventions: 1 to 1 sessions, Unit Group sessions and Medication administration.  Evaluation of Outcomes: Adequate for Discharge   RN Treatment Plan for Primary Diagnosis: Anxiety disorder Long Term Goal(s): Knowledge of disease and  therapeutic regimen to maintain health will improve  Short Term Goals: Ability to demonstrate self-control and Ability to identify and develop effective coping behaviors will improve  Medication Management: RN will administer medications as ordered by provider, will assess and evaluate patient's response and provide education to patient for prescribed medication. RN will report any adverse and/or side effects to prescribing provider.  Therapeutic Interventions: 1 on 1 counseling sessions, Psychoeducation, Medication administration, Evaluate responses to treatment, Monitor vital signs and CBGs as ordered, Perform/monitor CIWA, COWS, AIMS and Fall Risk screenings as ordered, Perform wound care treatments as ordered.  Evaluation of Outcomes: Adequate for Discharge   Recreational Therapy Treatment Plan for Primary Diagnosis: Anxiety disorder Long Term Goal(s): Patient will participate in recreation therapy treatment in at least 2 group sessions without prompting from  LRT  Short Term Goals: Patient will demonstrate increased ability to follow instructions, as demonstrated by ability to follow LRT instructions on first prompt during recreation therapy group sessions  Treatment Modalities: Group and Pet Therapy  Therapeutic Interventions: Psychoeducation  Evaluation of Outcomes: Adequate for Discharge  LCSW Treatment Plan for Primary Diagnosis: Anxiety disorder Long Term Goal(s): Safe transition to appropriate next level of care at discharge, Engage patient in therapeutic group addressing interpersonal concerns.  Short Term Goals: Engage patient in aftercare planning with referrals and resources  Therapeutic Interventions: Assess for all discharge needs, 1 to 1 time with Social worker, Explore available resources and support systems, Assess for adequacy in community support network, Educate family and significant other(s) on suicide prevention, Complete Psychosocial Assessment, Interpersonal group therapy.  Evaluation of Outcomes: Met  Return home with mother, follow up Family Services   Progress in Treatment: Attending groups: No Participating in groups: No Taking medication as prescribed: Yes Toleration medication: Yes, no side effects reported at this time Family/Significant other contact made: Yes Patient understands diagnosis: No Somatically focused Discussing patient identified problems/goals with staff: Yes Medical problems stabilized or resolved: Yes Denies suicidal/homicidal ideation: Yes Issues/concerns per patient self-inventory: None Other: N/A  New problem(s) identified: None identified at this time.   New Short Term/Long Term Goal(s): None identified at this time.   Discharge Plan or Barriers:   Reason for Continuation of Hospitalization:    Estimated Length of Stay: D/C today  Attendees: Patient: 08/27/2016  10:11 AM  Physician: Hampton Abbot, MD 08/27/2016  10:11 AM  Nursing: Elesa Massed RN 08/27/2016  10:11 AM  RN Care  Manager: Lars Pinks, RN 08/27/2016  10:11 AM  Social Worker: Ripley Fraise 08/27/2016  10:11 AM  Recreational Therapist: Victorino Sparrow, LRT/CTRS 08/27/2016  10:11 AM  Other: Norberto Sorenson 08/27/2016  10:11 AM  Other:  08/27/2016  10:11 AM    Scribe for Treatment Team:  Roque Lias LCSW 08/27/2016 10:11 AM

## 2016-08-27 NOTE — ED Provider Notes (Addendum)
[facility] DEPT Provider Note   CSN: [id number] Arrival date & time: 08/27/16  1501     History   Chief Complaint Chief Complaint  Patient presents with  . Chest Pain    HPI Beverly Torres is a 21 y.o. female.  Patient with PTSD and anxiety history presents for chest pain that lasted about 23 hours earlier today. Patient has schizophrenia history and was recently seen by behavior health. Patient denies fairly constant chest ache no current radiation. No exertional symptoms. No cardiac or blood clot history. Patient denies blood clot risk factors. Nothing specifically worsens or improves symptoms.      Past Medical History:  Diagnosis Date  . Acid reflux   . Anxiety   . Anxiety   . Attention deficit disorder     Patient Active Problem List   Diagnosis Date Noted  . Schizophrenia, acute undifferentiated ([place]) 08/27/2016  . Anxiety disorder 08/21/2016  . Anxiety 05/02/2012  . ADHD (attention deficit hyperactivity disorder), combined type 03/28/2012    Past Surgical History:  Procedure Laterality Date  . ADENOIDECTOMY    . TONSILLECTOMY    . WISDOM TOOTH EXTRACTION      OB History    No data available       Home Medications    Prior to Admission medications   Medication Sig Start Date End Date Taking? Authorizing Provider  albuterol (PROVENTIL HFA;VENTOLIN HFA) 108 (90 Base) MCG/ACT inhaler Inhale 2 puffs into the lungs every 4 (four) hours as needed for wheezing or shortness of breath (d/c home with pt). 08/27/16   Lindell Spar I, NP  ARIPiprazole (ABILIFY) 2 MG tablet Take 1 tablet (2 mg total) by mouth 2 (two) times daily. For mood control 08/27/16   Lindell Spar I, NP  ARIPiprazole ER 400 MG SRER Inject 400 mg into the muscle once. (Due 09-25-16): For mood control 09/25/17 09/25/17  Lindell Spar I, NP  cetirizine (ZYRTEC) 10 MG tablet Take 1 tablet (10 mg total) by mouth daily. For allergies 08/27/16   Lindell Spar I, NP  famotidine (PEPCID) 20 MG tablet Take  1 tablet (20 mg total) by mouth 2 (two) times daily. For acid reflux 08/27/16   Lindell Spar I, NP  fluticasone (FLONASE) 50 MCG/ACT nasal spray Place 1 spray into both nostrils daily. For allergies 08/27/16   Lindell Spar I, NP  gabapentin (NEURONTIN) 100 MG capsule Take 1 capsule (100 mg total) by mouth 2 (two) times daily. For agitation 08/27/16   Lindell Spar I, NP  hydrOXYzine (ATARAX/VISTARIL) 50 MG tablet Take 1 tablet (50 mg total) by mouth 3 (three) times daily as needed for anxiety. 08/27/16   Lindell Spar I, NP  LORazepam (ATIVAN) 1 MG tablet Take 1 tablet (1 mg total) by mouth every 8 (eight) hours as needed for anxiety. 08/27/16   Lindell Spar I, NP  mupirocin cream (BACTROBAN) 2 % Apply topically 2 (two) times daily. For wound care 08/27/16   Lindell Spar I, NP  nicotine polacrilex (NICORETTE) 2 MG gum Take 1 each (2 mg total) by mouth as needed for smoking cessation. 08/27/16   Lindell Spar I, NP  nystatin cream (MYCOSTATIN) Apply topically 2 (two) times daily. For fungal rash 08/27/16   Lindell Spar I, NP  sertraline (ZOLOFT) 100 MG tablet Take 1 tablet (100 mg total) by mouth daily. For depression 08/28/16   Lindell Spar I, NP  traZODone (DESYREL) 50 MG tablet Take 1 tablet (50 mg total) by mouth at bedtime as  needed for sleep. 08/27/16   Encarnacion Slates, NP    Family History Family History  Problem Relation Age of Onset  . Anxiety disorder Mother   . Drug abuse Father   . ADD / ADHD Father   . ADD / ADHD Sister   . Anxiety disorder Sister   . ADD / ADHD Brother   . ODD Brother   . Rheum arthritis Maternal Grandmother   . Kidney failure Maternal Grandmother   . Hypertension Maternal Grandfather   . Diabetes Maternal Grandfather   . Arthritis Maternal Grandfather   . Cancer Maternal Grandfather        Melanoma  . Diabetes Paternal Grandfather     Social History Social History  Substance Use Topics  . Smoking status: Current Every Day Smoker    Types: Cigarettes  . Smokeless  tobacco: Never Used  . Alcohol use No     Allergies   Strawberry (diagnostic)   Review of Systems Review of Systems  Constitutional: Negative for chills and fever.  HENT: Negative for congestion.   Eyes: Negative for visual disturbance.  Respiratory: Negative for shortness of breath.   Cardiovascular: Positive for chest pain.  Gastrointestinal: Negative for abdominal pain and vomiting.  Genitourinary: Negative for dysuria and flank pain.  Musculoskeletal: Negative for back pain, neck pain and neck stiffness.  Skin: Negative for rash.  Neurological: Negative for light-headedness and headaches.     Physical Exam Updated Vital Signs BP 114/65   Pulse (!) 101   Temp 98.9 F (37.2 C) (Oral)   Resp 18   LMP 08/26/2016   SpO2 97%   Physical Exam  Constitutional: She is oriented to person, place, and time. She appears well-developed and well-nourished.  HENT:  Head: Normocephalic and atraumatic.  Eyes: Conjunctivae are normal. Right eye exhibits no discharge. Left eye exhibits no discharge.  Neck: Normal range of motion. Neck supple. No tracheal deviation present.  Cardiovascular: Normal rate, regular rhythm and intact distal pulses.   Pulmonary/Chest: Effort normal and breath sounds normal.  Abdominal: Soft. She exhibits no distension. There is no tenderness. There is no guarding.  Musculoskeletal: She exhibits no edema.  Neurological: She is alert and oriented to person, place, and time.  Skin: Skin is warm. No rash noted.  Psychiatric: She has a normal mood and affect.  Nursing note and vitals reviewed.    ED Treatments / Results  Labs (all labs ordered are listed, but only abnormal results are displayed) Labs Reviewed  I-STAT CHEM 8, ED - Abnormal; Notable for the following:       Result Value   Hemoglobin 16.3 (*)    HCT 48.0 (*)    All other components within normal limits  I-STAT TROPONIN, ED    EKG  EKG Interpretation None      EKG 101 sinus tach,  reviewed, no acute st changes, nl qt, poor baseline Radiology Dg Chest 2 View  Result Date: 08/27/2016 CLINICAL DATA:  Central chest pain radiating to back for 2 hours EXAM: CHEST  2 VIEW COMPARISON:  08/20/2016 FINDINGS: Normal heart size, mediastinal contours, and pulmonary vascularity. Lungs clear. No pleural effusion or pneumothorax. Bones unremarkable. IMPRESSION: Normal exam. Electronically Signed   By: Lavonia Dana M.D.   On: 08/27/2016 16:50    Procedures Procedures (including critical care time)  Medications Ordered in ED Medications  acetaminophen (TYLENOL) tablet 1,000 mg (1,000 mg Oral Given 08/27/16 1724)     Initial Impression / Assessment and Plan /  ED Course  I have reviewed the triage vital signs and the nursing notes.  Pertinent labs & imaging results that were available during my care of the patient were reviewed by me and considered in my medical decision making (see chart for details).    Patient presents with atypical low risk chest pain. Plan for single troponin, basic blood work and chest x-ray. Discussed supportive care and outpatient follow-up if unremarkable. Patient well-appearing in the ER. Nl hr on recheck. Wells zero.   Results and differential diagnosis were discussed with the patient/parent/guardian. Xrays were independently reviewed by myself.  Close follow up outpatient was discussed, comfortable with the plan.   Medications  acetaminophen (TYLENOL) tablet 1,000 mg (1,000 mg Oral Given 08/27/16 1724)    Vitals:   08/27/16 1506  BP: 114/65  Pulse: (!) 101  Resp: 18  Temp: 98.9 F (37.2 C)  TempSrc: Oral  SpO2: 97%    Final diagnoses:  Acute chest pain     Final Clinical Impressions(s) / ED Diagnoses   Final diagnoses:  Acute chest pain    New Prescriptions New Prescriptions   No medications on file     Elnora Morrison, MD 08/27/16 1800    Elnora Morrison, MD 08/27/16 1819

## 2016-08-27 NOTE — Discharge Instructions (Signed)
If you were given medicines take as directed.  If you are on coumadin or contraceptives realize their levels and effectiveness is altered by many different medicines.  If you have any reaction (rash, tongues swelling, other) to the medicines stop taking and see a physician.    If your blood pressure was elevated in the ER make sure you follow up for management with a primary doctor or return for chest pain, shortness of breath or stroke symptoms.  Please follow up as directed and return to the ER or see a physician for new or worsening symptoms.  Thank you. Vitals:   08/27/16 1506  BP: 114/65  Pulse: (!) 101  Resp: 18  Temp: 98.9 F (37.2 C)  TempSrc: Oral  SpO2: 97%

## 2016-08-27 NOTE — ED Notes (Signed)
Pt verbalized understanding of d/c instructions and has no further questions. Pt stable and nAD. VSS.

## 2016-08-27 NOTE — ED Notes (Signed)
Dad called with his number [phone]
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
## 2016-08-27 NOTE — Progress Notes (Signed)
Recreation Therapy Notes  Date: 08/27/2016 Time: 10:00am Location: [address] Dayroom  Group Topic: Communication and Team-Building  Goal Area(s) Addresses:  Pts will successfully communicate with team members. Pts will successfully work with team members.  Intervention: STEM Activity  Activity: Pts were split into two teams of three. Each team received 20 plastic drinking straws and approximately 24 inches of masking tape. Pts were instructed to build a free-standing bridge with the material they were given that could hold a hardcover book.  Education: Communication, Warden/ranger, Discharge Planning  Education Outcome: Acknowledges understanding  Clinical Observations/Feedback: : Pt did not attend group.  Donovan Kail, Recreation Therapy   Victorino Sparrow, LRT/CTRS

## 2016-08-27 NOTE — Progress Notes (Signed)
Patient discontinued from 1:1.  Patient has exhibited no behavioral dyscontrol and has been exhibiting safe behaviors for the past 24 hours. Patient is currently engaged with peers and planning to participate in recreation activities.

## 2016-08-27 NOTE — Progress Notes (Signed)
Recreation Therapy Notes  INPATIENT RECREATION TR PLAN  Patient Details Name: Beverly Torres MRN: [id number] DOB: 1995/10/02 Today's Date: 08/27/2016  Rec Therapy Plan Is patient appropriate for Therapeutic Recreation?: Yes Treatment times per week: at least 3x Estimated Length of Stay: 5-7 days TR Treatment/Interventions: Group participation (Comment) (appropriate participation in recreation therapy group sessions)  Discharge Criteria Pt will be discharged from therapy if:: Discharged Treatment plan/goals/alternatives discussed and agreed upon by:: Patient/family  Discharge Summary Short term goals set: The Patient will attend and participate in Recreation Therapy Group Sessions Short term goals met: Not met Reason goals not met: Pt stated she did not want to attend or participate in recreation therapy groups. Therapeutic equipment acquired: n/a Reason patient discharged from therapy: Discharge from hospital Pt/family agrees with progress & goals achieved: Yes Date patient discharged from therapy: 08/27/16  Donovan Kail, Recreation Therapy Intern  Donovan Kail 08/27/2016, 11:54 AM   Victorino Sparrow, LRT/CTRS

## 2016-08-27 NOTE — [facility] Suicide Risk Assessment (Signed)
[facility] Discharge Suicide Risk Assessment   Principal Problem: Anxiety disorder Discharge Diagnoses:  Patient Active Problem List   Diagnosis Date Noted  . Anxiety disorder [F41.9] 08/21/2016  . Anxiety [F41.9] 05/02/2012  . ADHD (attention deficit hyperactivity disorder), combined type [F90.2] 03/28/2012    Total Time spent with patient: 30 minutes  Musculoskeletal: Strength & Muscle Tone: within normal limits Gait & Station: normal Patient leans: N/A  Psychiatric Specialty Exam: Review of Systems  Constitutional: Negative.  Negative for chills, fever, malaise/fatigue and weight loss.  HENT: Negative.  Negative for congestion and sore throat.   Eyes: Negative.  Negative for blurred vision, double vision, discharge and redness.  Respiratory: Negative.  Negative for cough, shortness of breath and wheezing.   Cardiovascular: Negative.  Negative for chest pain and palpitations.  Gastrointestinal: Negative.  Negative for abdominal pain, constipation, diarrhea, heartburn, nausea and vomiting.  Genitourinary: Negative.  Negative for dysuria.  Musculoskeletal: Negative.  Negative for falls, joint pain and myalgias.  Skin: Negative.  Negative for rash.  Neurological: Negative.  Negative for dizziness, seizures, loss of consciousness, weakness and headaches.  Endo/Heme/Allergies: Negative.  Negative for environmental allergies.  Psychiatric/Behavioral: Negative.  Negative for depression, hallucinations, memory loss, substance abuse and suicidal ideas. The patient is not nervous/anxious and does not have insomnia.     Blood pressure 124/60, pulse 75, temperature 98.1 F (36.7 C), temperature source Oral, resp. rate 20, height 5\' 2"  (1.575 m), weight 92.5 kg (204 lb), last menstrual period 08/03/2016, SpO2 100 %.Body mass index is 37.31 kg/m.  General Appearance: Casual  Eye Contact::  Good  Speech:  Clear and Coherent and Normal Rate409  Volume:  Normal  Mood:  Euthymic  Affect:   Appropriate, Congruent and Full Range  Thought Process:  Goal Directed and Descriptions of Associations: Intact  Orientation:  Full (Time, Place, and Person)  Thought Content:  Logical and Rumination  Suicidal Thoughts:  No  Homicidal Thoughts:  No  Memory:  Immediate;   Fair Recent;   Fair Remote;   Fair  Judgement:  Poor  Insight:  Lacking  Psychomotor Activity:  Normal  Concentration:  Fair  Recall:  [place]  Language: Fair  Akathisia:  No  Handed:  Right  AIMS (if indicated):     Assets:  Desire for Improvement Housing Physical Health Social Support Transportation  Sleep:  Number of Hours: 6.5  Cognition: WNL  ADL's:  Intact   Mental Status Per Nursing Assessment::   On Admission:     Demographic Factors:  Adolescent or young adult and Caucasian  Loss Factors: NA  Historical Factors: Impulsivity  Risk Reduction Factors:   Living with another person, especially a relative and Positive social support  Continued Clinical Symptoms:  Previous Psychiatric Diagnoses and Treatments  Cognitive Features That Contribute To Risk:  Closed-mindedness    Suicide Risk:  Minimal: No identifiable suicidal ideation.  Patients presenting with no risk factors but with morbid ruminations; may be classified as minimal risk based on the severity of the depressive symptoms  Follow-up Information    [facility] Follow up on 09/07/2016.   Specialty:  Professional Counselor Why:  Tuesday at 3:00 with Marliss Coots Contact information: [address] [place] [address] [phone]           Plan Of Care/Follow-up recommendations:  Activity:  As tolerated Diet:  Regular Other:  Keep follow-up appointments and take medications as prescribed. Patient is diagnosed with  generalized anxiety disorder but in the differential schizophrenia needs to be kept in mind that she has psychosomatic delusions, and this is been  going on for some time now. Patient is currently on Abilify Debroah Baller, MD 08/27/2016, 10:42 AM

## 2016-08-28 ENCOUNTER — Other Ambulatory Visit: Payer: Self-pay

## 2016-08-28 ENCOUNTER — Emergency Department (HOSPITAL_COMMUNITY)
Admission: EM | Admit: 2016-08-28 | Discharge: 2016-08-28 | Disposition: A | Discharge status: Home / Self Care | Payer: Medicaid Other | Attending: Emergency Medicine | Admitting: Emergency Medicine

## 2016-08-28 ENCOUNTER — Encounter (HOSPITAL_COMMUNITY): Payer: Self-pay

## 2016-08-28 ENCOUNTER — Encounter (HOSPITAL_COMMUNITY): Payer: Self-pay | Admitting: Emergency Medicine

## 2016-08-28 DIAGNOSIS — F17210 Nicotine dependence, cigarettes, uncomplicated: Secondary | ICD-10-CM | POA: Insufficient documentation

## 2016-08-28 DIAGNOSIS — F209 Schizophrenia, unspecified: Secondary | ICD-10-CM | POA: Insufficient documentation

## 2016-08-28 DIAGNOSIS — F909 Attention-deficit hyperactivity disorder, unspecified type: Secondary | ICD-10-CM | POA: Insufficient documentation

## 2016-08-28 DIAGNOSIS — R55 Syncope and collapse: Secondary | ICD-10-CM | POA: Insufficient documentation

## 2016-08-28 DIAGNOSIS — Z79899 Other long term (current) drug therapy: Secondary | ICD-10-CM | POA: Insufficient documentation

## 2016-08-28 DIAGNOSIS — R079 Chest pain, unspecified: Secondary | ICD-10-CM

## 2016-08-28 LAB — CBC
HCT: 42.7 % (ref 36.0–46.0)
Hemoglobin: 14.1 g/dL (ref 12.0–15.0)
MCH: 29.5 pg (ref 26.0–34.0)
MCHC: 33 g/dL (ref 30.0–36.0)
MCV: 89.3 fL (ref 78.0–100.0)
PLATELETS: 287 10*3/uL (ref 150–400)
RBC: 4.78 MIL/uL (ref 3.87–5.11)
RDW: 12.4 % (ref 11.5–15.5)
WBC: 7.7 10*3/uL (ref 4.0–10.5)

## 2016-08-28 LAB — BASIC METABOLIC PANEL
Anion gap: 8 (ref 5–15)
BUN: 15 mg/dL (ref 6–20)
CO2: 23 mmol/L (ref 22–32)
Calcium: 9.3 mg/dL (ref 8.9–10.3)
Chloride: 105 mmol/L (ref 101–111)
Creatinine, Ser: 0.87 mg/dL (ref 0.44–1.00)
GFR calc Af Amer: >60 mL/min (ref >60)
Glucose, Bld: 105 mg/dL — ABNORMAL HIGH (ref 65–99)
POTASSIUM: 4.3 mmol/L (ref 3.5–5.1)
SODIUM: 136 mmol/L (ref 135–145)

## 2016-08-28 LAB — URINALYSIS, ROUTINE W REFLEX MICROSCOPIC
Bacteria, UA: NONE SEEN
Bilirubin Urine: NEGATIVE
GLUCOSE, UA: NEGATIVE mg/dL
Ketones, ur: NEGATIVE mg/dL
Nitrite: NEGATIVE
PH: 5 (ref 5.0–8.0)
PROTEIN: NEGATIVE mg/dL
SPECIFIC GRAVITY, URINE: 1.024 (ref 1.005–1.030)

## 2016-08-28 LAB — POC URINE PREG, ED: Preg Test, Ur: NEGATIVE

## 2016-08-28 MED ORDER — SODIUM CHLORIDE 0.9 % IV BOLUS (SEPSIS)
1000.0000 mL | Freq: Once | INTRAVENOUS | Status: AC
  Administered 2016-08-28: 1000 mL via INTRAVENOUS

## 2016-08-28 NOTE — ED Notes (Signed)
Pt called out and asked this RN if she could get out of bed and when the PA would be back in. Pt placed in chair at bedside, still on monitor and informed that the provider would be back in shortly.

## 2016-08-28 NOTE — ED Notes (Addendum)
Pt urged to use call bell for any needs. Pt returned to monitor.

## 2016-08-28 NOTE — ED Notes (Signed)
Pt statesshe understands instructions. Pt dc home stable with steady gait.

## 2016-08-28 NOTE — ED Triage Notes (Signed)
Per EMS, pt from home complaining of a syncopal episode, pt states she fell in the bathroom and fell onto a pile of clothes and hit her head. No obvious deformity. Also complaining of CP but is pointing to her abdomen. Pt recently d/c from [facility]. BP 115/90, Hr 77, Sat 100%, RR 18, CBG 121. 12 lead showed NSR.

## 2016-08-28 NOTE — ED Provider Notes (Signed)
[facility] DEPT Provider Note   CSN: [id number] Arrival date & time: 08/28/16  1126     History   Chief Complaint Chief Complaint  Patient presents with  . Loss of Consciousness    HPI Beverly Torres is a 21 y.o. female.  Patient with no saving a past medical history, history of anxiety presents with complaint of syncopal episode occurring this morning after standing up to get out of bed. Patient had tunnel vision and lightheadedness prior to her fall. She is unclear how long she passed out for but thinks it was about 10 minutes. She awoke and called for assistance from her family. Patient called EMS. She did not have any associated chest pain or shortness of breath. She states that she has been eating and drinking well. No medications. No family history of cardiac problems or sudden cardiac death, especially at a young age. Patient denies risk factors for pulmonary embolism including: unilateral leg swelling, history of DVT/PE/other blood clots, use of exogenous hormones, recent immobilizations, recent surgery, recent travel (>4hr segment), malignancy, hemoptysis.        Past Medical History:  Diagnosis Date  . Acid reflux   . Anxiety   . Anxiety   . Attention deficit disorder     Patient Active Problem List   Diagnosis Date Noted  . 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 Anxiety disorder 08/21/2016  . Anxiety 05/02/2012  . ADHD (attention deficit hyperactivity disorder), combined type 03/28/2012    Past Surgical History:  Procedure Laterality Date  . ADENOIDECTOMY    . TONSILLECTOMY    . WISDOM TOOTH EXTRACTION      OB History    No data available       Home Medications    Prior to Admission medications   Medication Sig Start Date End Date Taking? Authorizing Provider  albuterol (PROVENTIL HFA;VENTOLIN HFA) 108 (90 Base) MCG/ACT inhaler Inhale 2 puffs into the lungs every 4 (four) hours as needed for wheezing or shortness of breath (d/c  home with pt). 08/27/16  Yes Lindell Spar I, NP  ARIPiprazole (ABILIFY) 2 MG tablet Take 1 tablet (2 mg total) by mouth 2 (two) times daily. For mood control 08/27/16  Yes Nwoko, Herbert Pun I, NP  gabapentin (NEURONTIN) 100 MG capsule Take 1 capsule (100 mg total) by mouth 2 (two) times daily. For agitation 08/27/16  Yes Lindell Spar I, NP  hydrOXYzine (ATARAX/VISTARIL) 50 MG tablet Take 1 tablet (50 mg total) by mouth 3 (three) times daily as needed for anxiety. 08/27/16  Yes Lindell Spar I, NP  LORazepam (ATIVAN) 1 MG tablet Take 1 tablet (1 mg total) by mouth every 8 (eight) hours as needed for anxiety. 08/27/16  Yes Lindell Spar I, NP  mupirocin cream (BACTROBAN) 2 % Apply topically 2 (two) times daily. For wound care 08/27/16  Yes Lindell Spar I, NP  naproxen (NAPROSYN) 500 MG tablet Take 500 mg by mouth 2 (two) times daily with a meal.   Yes [provider]  sertraline (ZOLOFT) 100 MG tablet Take 1 tablet (100 mg total) by mouth daily. For depression 08/28/16  Yes Lindell Spar I, NP  traZODone (DESYREL) 50 MG tablet Take 1 tablet (50 mg total) by mouth at bedtime as needed for sleep. 08/27/16  Yes Lindell Spar I, NP  ARIPiprazole ER 400 MG SRER Inject 400 mg into the muscle once. (Due 09-25-16): For mood control 09/25/17 09/25/17  Lindell Spar I, NP  cetirizine (ZYRTEC) 10 MG tablet Take 1  tablet (10 mg total) by mouth daily. For allergies Patient not taking: Reported on 08/28/2016 08/27/16   Lindell Spar I, NP  famotidine (PEPCID) 20 MG tablet Take 1 tablet (20 mg total) by mouth 2 (two) times daily. For acid reflux Patient not taking: Reported on 08/28/2016 08/27/16   Lindell Spar I, NP  fluticasone (FLONASE) 50 MCG/ACT nasal spray Place 1 spray into both nostrils daily. For allergies Patient not taking: Reported on 08/28/2016 08/27/16   Lindell Spar I, NP  nicotine polacrilex (NICORETTE) 2 MG gum Take 1 each (2 mg total) by mouth as needed for smoking cessation. Patient not taking: Reported on  08/28/2016 08/27/16   Lindell Spar I, NP  nystatin cream (MYCOSTATIN) Apply topically 2 (two) times daily. For fungal rash Patient not taking: Reported on 08/28/2016 08/27/16   Encarnacion Slates, NP    Family History Family History  Problem Relation Age of Onset  . Anxiety disorder Mother   . Drug abuse Father   . ADD / ADHD Father   . ADD / ADHD Sister   . Anxiety disorder Sister   . ADD / ADHD Brother   . ODD Brother   . Rheum arthritis Maternal Grandmother   . Kidney failure Maternal Grandmother   . Hypertension Maternal Grandfather   . Diabetes Maternal Grandfather   . Arthritis Maternal Grandfather   . Cancer Maternal Grandfather        Melanoma  . Diabetes Paternal Grandfather     Social History Social History  Substance Use Topics  . Smoking status: Current Every Day Smoker    Types: Cigarettes  . Smokeless tobacco: Never Used  . Alcohol use No     Allergies   Strawberry (diagnostic)   Review of Systems Review of Systems  Constitutional: Negative for diaphoresis and fever.  HENT: Negative for rhinorrhea and sore throat.   Eyes: Negative for redness.  Respiratory: Negative for cough and shortness of breath.   Cardiovascular: Negative for chest pain, palpitations and leg swelling.  Gastrointestinal: Negative for abdominal pain, diarrhea, nausea and vomiting.  Genitourinary: Negative for dysuria.  Musculoskeletal: Negative for back pain, myalgias and neck pain.  Skin: Negative for rash.  Neurological: Positive for syncope. Negative for light-headedness and headaches.  Psychiatric/Behavioral: The patient is not nervous/anxious.      Physical Exam Updated Vital Signs BP (!) 110/56 (BP Location: Right Arm)   Pulse 73   Temp 98.4 F (36.9 C) (Oral)   Resp 16   Ht 5\' 2"  (1.575 m)   Wt 92.5 kg (204 lb)   LMP 08/26/2016   SpO2 100%   BMI 37.31 kg/m   Physical Exam  Constitutional: She appears well-developed and well-nourished.  HENT:  Head: Normocephalic  and atraumatic.  Mouth/Throat: Oropharynx is clear and moist and mucous membranes are normal. Mucous membranes are not dry.  Eyes: Conjunctivae are normal.  Neck: Trachea normal and normal range of motion. Neck supple. Normal carotid pulses and no JVD present. No muscular tenderness present. Carotid bruit is not present. No tracheal deviation present.  Cardiovascular: Normal rate, regular rhythm, S1 normal, S2 normal, normal heart sounds and intact distal pulses.  Exam reveals no decreased pulses.   No murmur heard. Pulmonary/Chest: Effort normal. No respiratory distress. She has no wheezes. She exhibits no tenderness.  Abdominal: Soft. Normal aorta and bowel sounds are normal. There is no tenderness. There is no rebound and no guarding.  Musculoskeletal: Normal range of motion.  Neurological: She is alert.  Skin: Skin is warm and dry. She is not diaphoretic. No cyanosis. No pallor.  Psychiatric: She has a normal mood and affect.  Nursing note and vitals reviewed.    ED Treatments / Results  Labs (all labs ordered are listed, but only abnormal results are displayed) Labs Reviewed  BASIC METABOLIC PANEL - Abnormal; Notable for the following:       Result Value   Glucose, Bld 105 (*)    All other components within normal limits  URINALYSIS, ROUTINE W REFLEX MICROSCOPIC - Abnormal; Notable for the following:    Hgb urine dipstick LARGE (*)    Leukocytes, UA TRACE (*)    Squamous Epithelial / LPF 0-5 (*)    All other components within normal limits  CBC  POC URINE PREG, ED    ED ECG REPORT   Date: 08/28/2016  Rate: 74  Rhythm: normal sinus rhythm  QRS Axis: right  Intervals: normal  ST/T Wave abnormalities: normal  Conduction Disutrbances:none  Narrative Interpretation:   Old EKG Reviewed: changes noted, slower  I have personally reviewed the EKG tracing and agree with the computerized printout as noted.   Radiology Dg Chest 2 View  Result Date: 08/27/2016 CLINICAL  DATA:  Central chest pain radiating to back for 2 hours EXAM: CHEST  2 VIEW COMPARISON:  08/20/2016 FINDINGS: Normal heart size, mediastinal contours, and pulmonary vascularity. Lungs clear. No pleural effusion or pneumothorax. Bones unremarkable. IMPRESSION: Normal exam. Electronically Signed   By: Lavonia Dana M.D.   On: 08/27/2016 16:50    Procedures Procedures (including critical care time)  Medications Ordered in ED Medications  sodium chloride 0.9 % bolus 1,000 mL (0 mLs Intravenous Stopped 08/28/16 1310)     Initial Impression / Assessment and Plan / ED Course  I have reviewed the triage vital signs and the nursing notes.  Pertinent labs & imaging results that were available during my care of the patient were reviewed by me and considered in my medical decision making (see chart for details).     Patient seen and examined. Work-up initiated. Will hydrate.   Vital signs reviewed and are as follows: BP (!) 110/56 (BP Location: Right Arm)   Pulse 73   Temp 98.4 F (36.9 C) (Oral)   Resp 16   Ht 5\' 2"  (1.575 m)   Wt 92.5 kg (204 lb)   LMP 08/26/2016   SpO2 100%   BMI 37.31 kg/m   1:53 PM workup unrevealing. Patient to be discharged home.  Patient urged to return with worsening symptoms or other concerns. Patient verbalized understanding and agrees with plan.    Final Clinical Impressions(s) / ED Diagnoses   Final diagnoses:  Syncope, unspecified syncope type   Patient with syncopal episode reported today. Blood counts and electrolytes are normal. Patient is not pregnant. She is not orthostatic. EKG does not show any signs of prolonged QT, Brugada syndrome, no WPW (no delta wave), LVH, heart block.   Feel patient is safer discharged home at this time. No concern for PE or ACS.  New Prescriptions Current Discharge Medication List       Carlisle Cater, Hershal Coria 08/28/16 1354    Malvin Johns, MD 08/28/16 [phone]

## 2016-08-28 NOTE — Discharge Instructions (Signed)
Please read and follow all provided instructions.  Your diagnoses today include:  1. Syncope, unspecified syncope type     Tests performed today include:  EKG - normal  Blood counts and electrolytes - normal  Vital signs. See below for your results today.   Medications prescribed:   None  Take any prescribed medications only as directed.  Home care instructions:  Follow any educational materials contained in this packet.  BE VERY CAREFUL not to take multiple medicines containing Tylenol (also called acetaminophen). Doing so can lead to an overdose which can damage your liver and cause liver failure and possibly death.   Follow-up instructions: Please follow-up with your primary care provider in the next 3 days for further evaluation of your symptoms.   Return instructions:   Please return to the Emergency Department if you experience worsening symptoms.   Please return if you have any other emergent concerns.  Additional Information:  Your vital signs today were: BP (!) 103/52    Pulse 68    Temp 98.4 F (36.9 C) (Oral)    Resp 12    Ht 5\' 2"  (1.575 m)    Wt 92.5 kg (204 lb)    LMP 08/26/2016    SpO2 100%    BMI 37.31 kg/m  If your blood pressure (BP) was elevated above 135/85 this visit, please have this repeated by your doctor within one month. --------------

## 2016-08-28 NOTE — ED Provider Notes (Signed)
[name] Provider Note   CSN: [id number] Arrival date & time: 08/28/16  1605     History   Chief Complaint Chief Complaint  Patient presents with  . Depression    HPI Beverly Torres is a 21 y.o. female.  She reports that she has been depressed for 1 hour since her boyfriend broke up with her.  She requested "something for anxiety."  She was discharged from [facility] emergency department about 2 hours ago after an evaluation for syncope.  She states that she was discharged from the behavioral health Hospital yesterday and that she is taking samples of the medications that she was given.  She says the depression has caused "chest pain."  She denies nausea, vomiting, weakness or dizziness at this time.  There are no other known modifying factors.  HPI  Past Medical History:  Diagnosis Date  . Acid reflux   . Anxiety   . Anxiety   . Attention deficit disorder     Patient Active Problem List   Diagnosis Date Noted  . 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 Anxiety disorder 08/21/2016  . Anxiety 05/02/2012  . ADHD (attention deficit hyperactivity disorder), combined type 03/28/2012    Past Surgical History:  Procedure Laterality Date  . ADENOIDECTOMY    . TONSILLECTOMY    . WISDOM TOOTH EXTRACTION      OB History    No data available       Home Medications    Prior to Admission medications   Medication Sig Start Date End Date Taking? Authorizing Provider  albuterol (PROVENTIL HFA;VENTOLIN HFA) 108 (90 Base) MCG/ACT inhaler Inhale 2 puffs into the lungs every 4 (four) hours as needed for wheezing or shortness of breath (d/c home with pt). 08/27/16   Lindell Spar I, NP  ARIPiprazole (ABILIFY) 2 MG tablet Take 1 tablet (2 mg total) by mouth 2 (two) times daily. For mood control 08/27/16   Lindell Spar I, NP  ARIPiprazole ER 400 MG SRER Inject 400 mg into the muscle once. (Due 09-25-16): For mood control 09/25/17 09/25/17  Lindell Spar  I, NP  gabapentin (NEURONTIN) 100 MG capsule Take 1 capsule (100 mg total) by mouth 2 (two) times daily. For agitation 08/27/16   Lindell Spar I, NP  hydrOXYzine (ATARAX/VISTARIL) 50 MG tablet Take 1 tablet (50 mg total) by mouth 3 (three) times daily as needed for anxiety. 08/27/16   Lindell Spar I, NP  LORazepam (ATIVAN) 1 MG tablet Take 1 tablet (1 mg total) by mouth every 8 (eight) hours as needed for anxiety. 08/27/16   Lindell Spar I, NP  mupirocin cream (BACTROBAN) 2 % Apply topically 2 (two) times daily. For wound care 08/27/16   Lindell Spar I, NP  naproxen (NAPROSYN) 500 MG tablet Take 500 mg by mouth 2 (two) times daily with a meal.    [provider]  sertraline (ZOLOFT) 100 MG tablet Take 1 tablet (100 mg total) by mouth daily. For depression 08/28/16   Lindell Spar I, NP  traZODone (DESYREL) 50 MG tablet Take 1 tablet (50 mg total) by mouth at bedtime as needed for sleep. 08/27/16   Encarnacion Slates, NP    Family History Family History  Problem Relation Age of Onset  . Anxiety disorder Mother   . Drug abuse Father   . ADD / ADHD Father   . ADD / ADHD Sister   . Anxiety disorder Sister   . ADD / ADHD Brother   .  ODD Brother   . Rheum arthritis Maternal Grandmother   . Kidney failure Maternal Grandmother   . Hypertension Maternal Grandfather   . Diabetes Maternal Grandfather   . Arthritis Maternal Grandfather   . Cancer Maternal Grandfather        Melanoma  . Diabetes Paternal Grandfather     Social History Social History  Substance Use Topics  . Smoking status: Current Every Day Smoker    Types: Cigarettes  . Smokeless tobacco: Never Used  . Alcohol use No     Allergies   Strawberry (diagnostic)   Review of Systems Review of Systems  All other systems reviewed and are negative.    Physical Exam Updated Vital Signs BP 123/76 (BP Location: Left Arm)   Pulse 94   Temp 98.2 F (36.8 C) (Oral)   Resp 18   LMP 08/26/2016   SpO2 93%   Physical Exam    Constitutional: She is oriented to person, place, and time. She appears well-developed. No distress.  Overweight  HENT:  Head: Normocephalic and atraumatic.  Eyes: Pupils are equal, round, and reactive to light. Conjunctivae and EOM are normal.  Neck: Normal range of motion and phonation normal. Neck supple.  Cardiovascular: Normal rate and regular rhythm.   Pulmonary/Chest: Effort normal and breath sounds normal. She exhibits no tenderness.  Abdominal: Soft. She exhibits no distension. There is no tenderness. There is no guarding.  Musculoskeletal: Normal range of motion.  Neurological: She is alert and oriented to person, place, and time. She exhibits normal muscle tone.  Skin: Skin is warm and dry.  Psychiatric: She has a normal mood and affect. Her behavior is normal. Judgment and thought content normal.  She is not responding to internal stimuli.  He does not appear to have an overt mood disorder.  Nursing note and vitals reviewed.    ED Treatments / Results  Labs (all labs ordered are listed, but only abnormal results are displayed) Labs Reviewed - No data to display  EKG  EKG Interpretation None       Radiology Dg Chest 2 View  Result Date: 08/27/2016 CLINICAL DATA:  Central chest pain radiating to back for 2 hours EXAM: CHEST  2 VIEW COMPARISON:  08/20/2016 FINDINGS: Normal heart size, mediastinal contours, and pulmonary vascularity. Lungs clear. No pleural effusion or pneumothorax. Bones unremarkable. IMPRESSION: Normal exam. Electronically Signed   By: Lavonia Dana M.D.   On: 08/27/2016 16:50    Procedures Procedures (including critical care time)  Medications Ordered in ED Medications - No data to display   Initial Impression / Assessment and Plan / ED Course  I have reviewed the triage vital signs and the nursing notes.  Pertinent labs & imaging results that were available during my care of the patient were reviewed by me and considered in my medical  decision making (see chart for details).      Patient Vitals for the past 24 hrs:  BP Temp Temp src Pulse Resp SpO2  08/28/16 1613 123/76 98.2 F (36.8 C) Oral 94 18 93 %    5:20 PM Reevaluation with update and discussion. After initial assessment and treatment, an updated evaluation reveals no change in clinical status.  She is calm and comfortable.  She requested more testing on chest pain, and was advised that she did not need any additional testing.  All questions were answered. Delmy Holdren L    Final Clinical Impressions(s) / ED Diagnoses   Final diagnoses:  Schizophrenia, unspecified type ([place])  Chest pain, unspecified type    Patient with nonspecific chest pain, reported depression, and feeling upset over breakup with boyfriend, by her allegation.  Discharged from the behavioral health Hospital yesterday, after an evaluation and treatment for "psychosomatic complaints."  She was given 1 week's worth of medication at discharge.  She was also recently treated with a long-term injection of Abilify.  There is no evidence for acute unstable psychiatric condition at this time.  Patient has good follow-up scheduled, in 10 days.  Nursing Notes Reviewed/ Care Coordinated Applicable Imaging Reviewed Interpretation of Laboratory Data incorporated into ED treatment  The patient appears reasonably screened and/or stabilized for discharge and I doubt any other medical condition or other [facility] requiring further screening, evaluation, or treatment in the ED at this time prior to discharge.  Plan: Home Medications-continue current medications; Home Treatments-rest; return here if the recommended treatment, does not improve the symptoms; Recommended follow up-PCP and psychiatry as needed   New Prescriptions New Prescriptions   No medications on file     Daleen Bo, MD 08/28/16 [phone]

## 2016-08-28 NOTE — Discharge Instructions (Addendum)
°  Follow-up with the counselor listed below at the date and time listed:   [place] Follow up on 09/07/2016.   Specialty:  Professional Counselor Why:  Tuesday at 3:00 with [place] information: [place] [phone]   You have had a full complement of testing today, after the episode of syncope earlier.  Your chest pain is likely related to being upset after your breakup with your boyfriend.  Make sure you take your medicines as prescribed, which were given to you yesterday.

## 2016-08-28 NOTE — ED Notes (Signed)
Pt walks to Nurses station to inform nurse that fluids have completed. Caries cell phone. Gait is steadfy NAD.

## 2016-08-28 NOTE — ED Notes (Signed)
Pt called out and asked to go to the restroom. Pt taken off of monitor and ambulated to the restroom without assistance.

## 2016-08-29 ENCOUNTER — Emergency Department (HOSPITAL_COMMUNITY): Payer: Medicaid Other

## 2016-08-29 ENCOUNTER — Encounter (HOSPITAL_COMMUNITY): Payer: Self-pay | Admitting: Emergency Medicine

## 2016-08-29 ENCOUNTER — Emergency Department (HOSPITAL_COMMUNITY)
Admission: EM | Admit: 2016-08-29 | Discharge: 2016-08-29 | Disposition: A | Discharge status: Home / Self Care | Payer: Medicaid Other | Attending: Emergency Medicine | Admitting: Emergency Medicine

## 2016-08-29 ENCOUNTER — Other Ambulatory Visit: Payer: Self-pay

## 2016-08-29 DIAGNOSIS — F17210 Nicotine dependence, cigarettes, uncomplicated: Secondary | ICD-10-CM | POA: Insufficient documentation

## 2016-08-29 DIAGNOSIS — F419 Anxiety disorder, unspecified: Secondary | ICD-10-CM | POA: Insufficient documentation

## 2016-08-29 DIAGNOSIS — F909 Attention-deficit hyperactivity disorder, unspecified type: Secondary | ICD-10-CM | POA: Insufficient documentation

## 2016-08-29 HISTORY — DX: Depression, unspecified: F32.A

## 2016-08-29 HISTORY — DX: Major depressive disorder, single episode, unspecified: F32.9

## 2016-08-29 LAB — CBC
HCT: 41.7 % (ref 36.0–46.0)
HEMOGLOBIN: 14.3 g/dL (ref 12.0–15.0)
MCH: 30.4 pg (ref 26.0–34.0)
MCHC: 34.3 g/dL (ref 30.0–36.0)
MCV: 88.5 fL (ref 78.0–100.0)
Platelets: 300 10*3/uL (ref 150–400)
RBC: 4.71 MIL/uL (ref 3.87–5.11)
RDW: 12.5 % (ref 11.5–15.5)
WBC: 9.2 10*3/uL (ref 4.0–10.5)

## 2016-08-29 LAB — BASIC METABOLIC PANEL
ANION GAP: 8 (ref 5–15)
BUN: 18 mg/dL (ref 6–20)
CALCIUM: 9.8 mg/dL (ref 8.9–10.3)
CO2: 26 mmol/L (ref 22–32)
Chloride: 107 mmol/L (ref 101–111)
Creatinine, Ser: 0.98 mg/dL (ref 0.44–1.00)
GFR calc Af Amer: >60 mL/min (ref >60)
GLUCOSE: 116 mg/dL — AB (ref 65–99)
Potassium: 4.1 mmol/L (ref 3.5–5.1)
Sodium: 141 mmol/L (ref 135–145)

## 2016-08-29 LAB — POCT I-STAT TROPONIN I: TROPONIN I, POC: 0.01 ng/mL (ref 0.00–0.08)

## 2016-08-29 NOTE — ED Provider Notes (Signed)
[facility] DEPT Provider Note   CSN: [id number] Arrival date & time: 08/29/16  1252     History   Chief Complaint Chief Complaint  Patient presents with  . Chest Pain    HPI Beverly Torres is a 21 y.o. female.  HPI   21 year old female presents today with complaints of chest pain.  Patient has been seen 22 times in the last 6 months with majority of these visits for chest pain anxiety.  Patient notes approximately 10 minutes prior to arrival she started having a sharp stabbing pain in her left anterior chest.  She also notes some indigestion associated with this.  She notes this has almost completely resolved at the time of evaluation.  Patient reports she feels like this was her anxiety is now she is feeling very fatigued.  She denies any associated shortness of breath, denies any history of cardiac disease.  Patient's father is at bedside who reports that he feels this is her anxiety is well and has questions of why she continues to have anxiety.  Patient will be living with him now as he feels that her home life with her mother is causing significant anxiety.  Patient denies any lower swelling edema, she denies any DVT or PE risk factors.   Past Medical History:  Diagnosis Date  . Acid reflux   . Anxiety   . Anxiety   . Attention deficit disorder   . Depression     Patient Active Problem List   Diagnosis Date Noted  . 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 Anxiety disorder 08/21/2016  . Anxiety 05/02/2012  . ADHD (attention deficit hyperactivity disorder), combined type 03/28/2012    Past Surgical History:  Procedure Laterality Date  . ADENOIDECTOMY    . TONSILLECTOMY    . WISDOM TOOTH EXTRACTION      OB History    No data available       Home Medications    Prior to Admission medications   Medication Sig Start Date End Date Taking? Authorizing Provider  albuterol (PROVENTIL HFA;VENTOLIN HFA) 108 (90 Base) MCG/ACT inhaler Inhale 2 puffs  into the lungs every 4 (four) hours as needed for wheezing or shortness of breath (d/c home with pt). 08/27/16   Lindell Spar I, NP  ARIPiprazole (ABILIFY) 2 MG tablet Take 1 tablet (2 mg total) by mouth 2 (two) times daily. For mood control 08/27/16   Lindell Spar I, NP  ARIPiprazole ER 400 MG SRER Inject 400 mg into the muscle once. (Due 09-25-16): For mood control 09/25/17 09/25/17  Lindell Spar I, NP  gabapentin (NEURONTIN) 100 MG capsule Take 1 capsule (100 mg total) by mouth 2 (two) times daily. For agitation 08/27/16   Lindell Spar I, NP  hydrOXYzine (ATARAX/VISTARIL) 50 MG tablet Take 1 tablet (50 mg total) by mouth 3 (three) times daily as needed for anxiety. 08/27/16   Lindell Spar I, NP  LORazepam (ATIVAN) 1 MG tablet Take 1 tablet (1 mg total) by mouth every 8 (eight) hours as needed for anxiety. 08/27/16   Lindell Spar I, NP  mupirocin cream (BACTROBAN) 2 % Apply topically 2 (two) times daily. For wound care 08/27/16   Lindell Spar I, NP  naproxen (NAPROSYN) 500 MG tablet Take 500 mg by mouth 2 (two) times daily with a meal.    [provider]  sertraline (ZOLOFT) 100 MG tablet Take 1 tablet (100 mg total) by mouth daily. For depression 08/28/16   Encarnacion Slates, NP  traZODone (DESYREL) 50 MG tablet Take 1 tablet (50 mg total) by mouth at bedtime as needed for sleep. 08/27/16   Encarnacion Slates, NP    Family History Family History  Problem Relation Age of Onset  . Anxiety disorder Mother   . Drug abuse Father   . ADD / ADHD Father   . ADD / ADHD Sister   . Anxiety disorder Sister   . ADD / ADHD Brother   . ODD Brother   . Rheum arthritis Maternal Grandmother   . Kidney failure Maternal Grandmother   . Hypertension Maternal Grandfather   . Diabetes Maternal Grandfather   . Arthritis Maternal Grandfather   . Cancer Maternal Grandfather        Melanoma  . Diabetes Paternal Grandfather     Social History Social History  Substance Use Topics  . Smoking status: Current Every  Day Smoker    Types: Cigarettes  . Smokeless tobacco: Never Used  . Alcohol use No     Allergies   Strawberry (diagnostic)   Review of Systems Review of Systems  All other systems reviewed and are negative.    Physical Exam Updated Vital Signs BP 136/66 (BP Location: Left Arm)   Pulse 94   Temp 98.1 F (36.7 C) (Oral)   LMP 08/26/2016   SpO2 99%   Physical Exam  Constitutional: She is oriented to person, place, and time. She appears well-developed and well-nourished.  HENT:  Head: Normocephalic and atraumatic.  Eyes: Pupils are equal, round, and reactive to light. Conjunctivae are normal. Right eye exhibits no discharge. Left eye exhibits no discharge. No scleral icterus.  Neck: Normal range of motion. No JVD present. No tracheal deviation present.  Pulmonary/Chest: Effort normal. No stridor.  Neurological: She is alert and oriented to person, place, and time. Coordination normal.  Psychiatric: She has a normal mood and affect. Her behavior is normal. Judgment and thought content normal.  Nursing note and vitals reviewed.   ED Treatments / Results  Labs (all labs ordered are listed, but only abnormal results are displayed) Labs Reviewed  BASIC METABOLIC PANEL - Abnormal; Notable for the following:       Result Value   Glucose, Bld 116 (*)    All other components within normal limits  CBC  I-STAT TROPONIN, ED  POCT I-STAT TROPONIN I    EKG  EKG Interpretation None       Radiology Dg Chest 2 View  Result Date: 08/29/2016 CLINICAL DATA:  Chest pain beginning 30 minutes ago. Some shortness-of-breath and dizziness. Anxiety. EXAM: CHEST  2 VIEW COMPARISON:  08/27/2016 FINDINGS: Lungs are adequately inflated and otherwise clear. Cardiomediastinal silhouette is within normal. Bones and soft tissues are normal. IMPRESSION: No active cardiopulmonary disease. Electronically Signed   By: Marin Olp M.D.   On: 08/29/2016 14:13   Dg Chest 2 View  Result Date:  08/27/2016 CLINICAL DATA:  Central chest pain radiating to back for 2 hours EXAM: CHEST  2 VIEW COMPARISON:  08/20/2016 FINDINGS: Normal heart size, mediastinal contours, and pulmonary vascularity. Lungs clear. No pleural effusion or pneumothorax. Bones unremarkable. IMPRESSION: Normal exam. Electronically Signed   By: Lavonia Dana M.D.   On: 08/27/2016 16:50    Procedures Procedures (including critical care time)  Medications Ordered in ED Medications - No data to display   Initial Impression / Assessment and Plan / ED Course  I have reviewed the triage vital signs and the nursing notes.  Pertinent labs & imaging  results that were available during my care of the patient were reviewed by me and considered in my medical decision making (see chart for details).      Final Clinical Impressions(s) / ED Diagnoses   Final diagnoses:  Anxiety   Labs: Point-of-care i-STAT troponin, BMP, CBC  Imaging: DG Chest 2 View  Consults:  Therapeutics:  Discharge Meds:   Assessment/Plan: 21 year old female presents today with chest pain.  This is likely anxiety, patient feels this is anxiety.  She has reassuring workup.  I counseled her extensively on using the emergency room for her anxiety symptoms and return precautions.  Patient will follow-up as an outpatient I have very low suspicion for ACS, PE, or any other significant life-threatening etiology.  Patient verbalized understanding and agreement to today's plan.  Patient did not stay for discharge information      New Prescriptions New Prescriptions   No medications on file     Francee Gentile 08/29/16 1555    Gareth Morgan, MD 08/30/16 [phone]

## 2016-08-29 NOTE — ED Notes (Signed)
Pt not in room for discharge. Pt was also not in room when registration came by 10 minutes ago. Assumed pt left prior to receiving paperwork.

## 2016-08-29 NOTE — ED Notes (Signed)
Bed: [facility] Expected date:  Expected time:  Means of arrival:  Comments: 

## 2016-08-29 NOTE — ED Triage Notes (Signed)
Pt reports she began to have CP about 30 minutes ago. Some SOB and dizziness. Hx of anxiety, felt anxious earlier today.

## 2016-08-29 NOTE — Discharge Instructions (Signed)
Please read attached information. If you experience any new or worsening signs or symptoms please return to the emergency room for evaluation. Please follow-up with your primary care provider or specialist as discussed.  °

## 2016-08-30 ENCOUNTER — Encounter (HOSPITAL_COMMUNITY): Payer: Self-pay

## 2016-08-30 ENCOUNTER — Other Ambulatory Visit: Payer: Self-pay

## 2016-08-30 DIAGNOSIS — F17210 Nicotine dependence, cigarettes, uncomplicated: Secondary | ICD-10-CM | POA: Insufficient documentation

## 2016-08-30 DIAGNOSIS — R0789 Other chest pain: Secondary | ICD-10-CM | POA: Insufficient documentation

## 2016-08-30 LAB — COMPREHENSIVE METABOLIC PANEL
ALK PHOS: 60 U/L (ref 38–126)
ALT: 51 U/L (ref 14–54)
ANION GAP: 10 (ref 5–15)
AST: 29 U/L (ref 15–41)
Albumin: 4.4 g/dL (ref 3.5–5.0)
BUN: 17 mg/dL (ref 6–20)
CALCIUM: 9.3 mg/dL (ref 8.9–10.3)
CO2: 22 mmol/L (ref 22–32)
CREATININE: 0.97 mg/dL (ref 0.44–1.00)
Chloride: 106 mmol/L (ref 101–111)
Glucose, Bld: 114 mg/dL — ABNORMAL HIGH (ref 65–99)
Potassium: 3.5 mmol/L (ref 3.5–5.1)
SODIUM: 138 mmol/L (ref 135–145)
Total Bilirubin: 0.4 mg/dL (ref 0.3–1.2)
Total Protein: 7 g/dL (ref 6.5–8.1)

## 2016-08-30 LAB — TROPONIN I: Troponin I: <0.03 ng/mL (ref <0.03)

## 2016-08-30 NOTE — ED Triage Notes (Signed)
Patient reports of left sided chest pain that radiates to back x2 hours. States she is seen at [facility] for anxiety.

## 2016-08-30 NOTE — ED Notes (Signed)
EKG given to Dr. McManus.  

## 2016-08-31 ENCOUNTER — Emergency Department (HOSPITAL_COMMUNITY)
Admission: EM | Admit: 2016-08-31 | Discharge: 2016-08-31 | Disposition: A | Discharge status: Home / Self Care | Payer: Self-pay | Attending: Emergency Medicine | Admitting: Emergency Medicine

## 2016-08-31 ENCOUNTER — Other Ambulatory Visit: Payer: Self-pay

## 2016-08-31 ENCOUNTER — Inpatient Hospital Stay (INDEPENDENT_AMBULATORY_CARE_PROVIDER_SITE_OTHER): Payer: Self-pay | Admitting: Physician Assistant

## 2016-08-31 ENCOUNTER — Emergency Department (HOSPITAL_COMMUNITY)
Admission: EM | Admit: 2016-08-31 | Discharge: 2016-08-31 | Disposition: A | Discharge status: Home / Self Care | Payer: Medicaid Other | Attending: Physician Assistant | Admitting: Physician Assistant

## 2016-08-31 ENCOUNTER — Encounter (HOSPITAL_COMMUNITY): Payer: Self-pay

## 2016-08-31 ENCOUNTER — Emergency Department (HOSPITAL_COMMUNITY): Payer: Self-pay

## 2016-08-31 ENCOUNTER — Encounter: Payer: Self-pay | Admitting: Family Medicine

## 2016-08-31 ENCOUNTER — Ambulatory Visit (INDEPENDENT_AMBULATORY_CARE_PROVIDER_SITE_OTHER): Payer: Self-pay | Admitting: Family Medicine

## 2016-08-31 VITALS — BP 102/64 | HR 88 | Temp 97.0°F | Resp 17 | Ht 63.5 in | Wt 205.0 lb

## 2016-08-31 DIAGNOSIS — R0789 Other chest pain: Secondary | ICD-10-CM | POA: Insufficient documentation

## 2016-08-31 DIAGNOSIS — Z79899 Other long term (current) drug therapy: Secondary | ICD-10-CM | POA: Insufficient documentation

## 2016-08-31 DIAGNOSIS — F419 Anxiety disorder, unspecified: Secondary | ICD-10-CM | POA: Insufficient documentation

## 2016-08-31 DIAGNOSIS — G8929 Other chronic pain: Secondary | ICD-10-CM

## 2016-08-31 DIAGNOSIS — R079 Chest pain, unspecified: Secondary | ICD-10-CM

## 2016-08-31 DIAGNOSIS — F17210 Nicotine dependence, cigarettes, uncomplicated: Secondary | ICD-10-CM | POA: Insufficient documentation

## 2016-08-31 HISTORY — DX: Chest pain, unspecified: R07.9

## 2016-08-31 HISTORY — DX: Other chronic pain: G89.29

## 2016-08-31 LAB — CBC WITH DIFFERENTIAL/PLATELET
BASOS PCT: 1 %
Basophils Absolute: 0.1 10*3/uL (ref 0.0–0.1)
Eosinophils Absolute: 0.2 10*3/uL (ref 0.0–0.7)
Eosinophils Relative: 2 %
HEMATOCRIT: 42 % (ref 36.0–46.0)
Hemoglobin: 14.2 g/dL (ref 12.0–15.0)
LYMPHS ABS: 3.8 10*3/uL (ref 0.7–4.0)
Lymphocytes Relative: 35 %
MCH: 30.2 pg (ref 26.0–34.0)
MCHC: 33.8 g/dL (ref 30.0–36.0)
MCV: 89.4 fL (ref 78.0–100.0)
MONO ABS: 0.9 10*3/uL (ref 0.1–1.0)
MONOS PCT: 8 %
NEUTROS ABS: 6 10*3/uL (ref 1.7–7.7)
Neutrophils Relative %: 54 %
Platelets: 290 10*3/uL (ref 150–400)
RBC: 4.7 MIL/uL (ref 3.87–5.11)
RDW: 12.4 % (ref 11.5–15.5)
WBC: 11 10*3/uL — ABNORMAL HIGH (ref 4.0–10.5)

## 2016-08-31 LAB — I-STAT TROPONIN, ED: Troponin i, poc: 0 ng/mL (ref 0.00–0.08)

## 2016-08-31 MED ORDER — FAMOTIDINE 20 MG PO TABS: 20.0000 mg | ORAL_TABLET | Freq: Two times a day (BID) | ORAL | 3 refills | Status: DC

## 2016-08-31 MED ORDER — GI COCKTAIL ~~LOC~~
30.0000 mL | Freq: Once | ORAL | Status: AC
  Administered 2016-08-31: 30 mL via ORAL
  Filled 2016-08-31: qty 30

## 2016-08-31 MED ORDER — ALBUTEROL SULFATE HFA 108 (90 BASE) MCG/ACT IN AERS: 2.0000 | INHALATION_SPRAY | RESPIRATORY_TRACT | 1 refills | Status: DC | PRN

## 2016-08-31 NOTE — ED Notes (Signed)
Pt alert & oriented x4, stable gait. Patient given discharge instructions, paperwork & prescription(s). Patient  instructed to stop at the registration desk to finish any additional paperwork. Patient verbalized understanding. Pt left department w/ no further questions. 

## 2016-08-31 NOTE — Progress Notes (Signed)
Chief Complaint  Patient presents with  . Chest Pain  . Anxiety    HPI  She reports that she has been to the ER and was discharged at 2am this morning She continues to have chest pain She reports that she just does not feel like her chest pain is any better It has not changed She was sent to [facility] She also reports that she feels the pain in her chest She denies nausea or vomiting   Past Medical History:  Diagnosis Date  . Acid reflux   . Anxiety   . Anxiety   . Attention deficit disorder   . Chronic chest pain   . Depression     Current Outpatient Prescriptions  Medication Sig Dispense Refill  . albuterol (PROVENTIL HFA;VENTOLIN HFA) 108 (90 Base) MCG/ACT inhaler Inhale 2 puffs into the lungs every 4 (four) hours as needed for wheezing or shortness of breath. 18 g 1  . ARIPiprazole (ABILIFY) 2 MG tablet Take 1 tablet (2 mg total) by mouth 2 (two) times daily. For mood control 60 tablet 0  . [START ON 09/25/2017] ARIPiprazole ER 400 MG SRER Inject 400 mg into the muscle once. (Due 09-25-16): For mood control 400 mg 1  . gabapentin (NEURONTIN) 100 MG capsule Take 1 capsule (100 mg total) by mouth 2 (two) times daily. For agitation 60 capsule 0  . hydrOXYzine (ATARAX/VISTARIL) 50 MG tablet Take 1 tablet (50 mg total) by mouth 3 (three) times daily as needed for anxiety. 60 tablet 0  . LORazepam (ATIVAN) 1 MG tablet Take 1 tablet (1 mg total) by mouth every 8 (eight) hours as needed for anxiety. 12 tablet 0  . mupirocin cream (BACTROBAN) 2 % Apply topically 2 (two) times daily. For wound care 15 g 0  . naproxen (NAPROSYN) 500 MG tablet Take 500 mg by mouth 2 (two) times daily with a meal.    . sertraline (ZOLOFT) 100 MG tablet Take 1 tablet (100 mg total) by mouth daily. For depression 30 tablet 0  . traZODone (DESYREL) 50 MG tablet Take 1 tablet (50 mg total) by mouth at bedtime as needed for sleep. 30 tablet 0  . famotidine (PEPCID) 20 MG tablet Take 1 tablet (20 mg total) by mouth  2 (two) times daily. 60 tablet 3   No current facility-administered medications for this visit.     Allergies:  Allergies  Allergen Reactions  . Strawberry (Diagnostic) Itching    Past Surgical History:  Procedure Laterality Date  . ADENOIDECTOMY    . TONSILLECTOMY    . WISDOM TOOTH EXTRACTION      Social History   Social History  . Marital status: Single    Spouse name: N/A  . Number of children: N/A  . Years of education: N/A   Social History Main Topics  . Smoking status: Current Every Day Smoker    Types: Cigarettes  . Smokeless tobacco: Never Used  . Alcohol use No  . Drug use: No  . Sexual activity: No   Other Topics Concern  . None   Social History Narrative  . None    ROS See hpi  Objective: Vitals:   08/31/16 1128  BP: 102/64  Pulse: 88  Resp: 17  Temp: (!) 97 F (36.1 C)  TempSrc: Oral  SpO2: 98%  Weight: 205 lb (93 kg)  Height: 5' 3.5" (1.613 m)    Physical Exam  Constitutional: She is oriented to person, place, and time. She appears well-developed and well-nourished.  HENT:  Head: Normocephalic and atraumatic.  Eyes: Conjunctivae and EOM are normal.  Cardiovascular: Normal rate, regular rhythm and normal heart sounds.   Pulmonary/Chest: Effort normal and breath sounds normal. No respiratory distress. She has no wheezes.  Abdominal: She exhibits no distension and no mass. There is no tenderness. There is no guarding.  Obese abdomen with numerous striae  Neurological: She is alert and oriented to person, place, and time.     Troponin overnight at 21:25 <0.03 Cbc mildly elevated WBC CMP wnl    Assessment and Plan Beverly Torres was seen today for chest pain and anxiety.  Diagnoses and all orders for this visit:  Other chest pain  Advised pt to follow up with [facility]  She is on multiple classes of medications She can take some practical approaches like deep breathing Will add pepcid today since she has large abdominal girth and some  constipation which can lead to some epigastric pain    Zoe A Nolon Rod

## 2016-08-31 NOTE — Patient Instructions (Signed)
     IF you received an x-ray today, you will receive an invoice from [facility]. Please contact [facility] at [phone] with questions or concerns regarding your invoice.   IF you received labwork today, you will receive an invoice from [facility]. Please contact [facility] at [phone] with questions or concerns regarding your invoice.   Our billing staff will not be able to assist you with questions regarding bills from these companies.  You will be contacted with the lab results as soon as they are available. The fastest way to get your results is to activate your My Chart account. Instructions are located on the last page of this paperwork. If you have not heard from us regarding the results in 2 weeks, please contact this office.     

## 2016-08-31 NOTE — ED Provider Notes (Signed)
[facility] DEPT Provider Note   CSN: [id number] Arrival date & time: 08/30/16  2100     History   Chief Complaint Chief Complaint  Patient presents with  . Chest Pain    HPI Beverly Torres is a 21 y.o. female.  HPI  21 year old female with a history of chronic chest pain who has been to the ED around 15 times this month presents with recurrent left-sided chest pain. It is both left upper chest and left lower chest under her breast. Is a sharp, stabbing pain. Worsens with palpation and certain movements. There is no pleuritic pain. No cough or shortness of breath. No leg swelling. She states this pain is very similar to the multiple time she has presented with chest pain in the past. This episode started around 5 PM. Naproxen is electing that helps. She has a PCP visit set up for later this afternoon and 7/24. Dad states he thinks it correlates with anxiety.  Past Medical History:  Diagnosis Date  . Acid reflux   . Anxiety   . Anxiety   . Attention deficit disorder   . Chronic chest pain   . Depression     Patient Active Problem List   Diagnosis Date Noted  . 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 Anxiety disorder 08/21/2016  . Anxiety 05/02/2012  . ADHD (attention deficit hyperactivity disorder), combined type 03/28/2012    Past Surgical History:  Procedure Laterality Date  . ADENOIDECTOMY    . TONSILLECTOMY    . WISDOM TOOTH EXTRACTION      OB History    No data available       Home Medications    Prior to Admission medications   Medication Sig Start Date End Date Taking? Authorizing Provider  albuterol (PROVENTIL HFA;VENTOLIN HFA) 108 (90 Base) MCG/ACT inhaler Inhale 2 puffs into the lungs every 4 (four) hours as needed for wheezing or shortness of breath (d/c home with pt). 08/27/16   Lindell Spar I, NP  ARIPiprazole (ABILIFY) 2 MG tablet Take 1 tablet (2 mg total) by mouth 2 (two) times daily. For mood control 08/27/16   Lindell Spar I,  NP  ARIPiprazole ER 400 MG SRER Inject 400 mg into the muscle once. (Due 09-25-16): For mood control 09/25/17 09/25/17  Lindell Spar I, NP  gabapentin (NEURONTIN) 100 MG capsule Take 1 capsule (100 mg total) by mouth 2 (two) times daily. For agitation 08/27/16   Lindell Spar I, NP  hydrOXYzine (ATARAX/VISTARIL) 50 MG tablet Take 1 tablet (50 mg total) by mouth 3 (three) times daily as needed for anxiety. 08/27/16   Lindell Spar I, NP  LORazepam (ATIVAN) 1 MG tablet Take 1 tablet (1 mg total) by mouth every 8 (eight) hours as needed for anxiety. 08/27/16   Lindell Spar I, NP  mupirocin cream (BACTROBAN) 2 % Apply topically 2 (two) times daily. For wound care 08/27/16   Lindell Spar I, NP  naproxen (NAPROSYN) 500 MG tablet Take 500 mg by mouth 2 (two) times daily with a meal.    [provider]  sertraline (ZOLOFT) 100 MG tablet Take 1 tablet (100 mg total) by mouth daily. For depression 08/28/16   Lindell Spar I, NP  traZODone (DESYREL) 50 MG tablet Take 1 tablet (50 mg total) by mouth at bedtime as needed for sleep. 08/27/16   Encarnacion Slates, NP    Family History Family History  Problem Relation Age of Onset  . Anxiety disorder Mother   . Drug  abuse Father   . ADD / ADHD Father   . ADD / ADHD Sister   . Anxiety disorder Sister   . ADD / ADHD Brother   . ODD Brother   . Rheum arthritis Maternal Grandmother   . Kidney failure Maternal Grandmother   . Hypertension Maternal Grandfather   . Diabetes Maternal Grandfather   . Arthritis Maternal Grandfather   . Cancer Maternal Grandfather        Melanoma  . Diabetes Paternal Grandfather     Social History Social History  Substance Use Topics  . Smoking status: Current Every Day Smoker    Types: Cigarettes  . Smokeless tobacco: Never Used  . Alcohol use No     Allergies   Strawberry (diagnostic)   Review of Systems Review of Systems  Respiratory: Negative for cough and shortness of breath.   Cardiovascular: Positive for chest  pain. Negative for leg swelling.  Gastrointestinal: Negative for abdominal pain.  Musculoskeletal: Negative for back pain.  All other systems reviewed and are negative.    Physical Exam Updated Vital Signs BP 117/63 (BP Location: Right Arm)   Pulse 62   Temp (!) 97.5 F (36.4 C) (Oral)   Resp 18   Ht 5\' 2"  (1.575 m)   Wt 92.5 kg (204 lb)   LMP 08/26/2016   SpO2 99%   BMI 37.31 kg/m   Physical Exam  Constitutional: She is oriented to person, place, and time. She appears well-developed and well-nourished. No distress.  HENT:  Head: Normocephalic and atraumatic.  Right Ear: External ear normal.  Left Ear: External ear normal.  Nose: Nose normal.  Eyes: Right eye exhibits no discharge. Left eye exhibits no discharge.  Cardiovascular: Normal rate, regular rhythm and normal heart sounds.   Pulses:      Radial pulses are 2+ on the right side, and 2+ on the left side.  Pulmonary/Chest: Effort normal and breath sounds normal. She exhibits tenderness.    Abdominal: Soft. She exhibits no distension. There is no tenderness.  Neurological: She is alert and oriented to person, place, and time.  Skin: Skin is warm and dry. She is not diaphoretic.  Nursing note and vitals reviewed.    ED Treatments / Results  Labs (all labs ordered are listed, but only abnormal results are displayed) Labs Reviewed  COMPREHENSIVE METABOLIC PANEL - Abnormal; Notable for the following:       Result Value   Glucose, Bld 114 (*)    All other components within normal limits  CBC WITH DIFFERENTIAL/PLATELET - Abnormal; Notable for the following:    WBC 11.0 (*)    All other components within normal limits  TROPONIN I    EKG  EKG Interpretation  Date/Time:  Monday August 30 2016 21:03:16 EDT Ventricular Rate:  76 PR Interval:  110 QRS Duration: 90 QT Interval:  396 QTC Calculation: 445 R Axis:   83 Text Interpretation:  Sinus rhythm with sinus arrhythmia with short PR Otherwise normal ECG no  significant change compared to yesterday Confirmed by Sherwood Gambler [phone]) on 08/31/2016 1:14:09 AM       Radiology Dg Chest 2 View  Result Date: 08/29/2016 CLINICAL DATA:  Chest pain beginning 30 minutes ago. Some shortness-of-breath and dizziness. Anxiety. EXAM: CHEST  2 VIEW COMPARISON:  08/27/2016 FINDINGS: Lungs are adequately inflated and otherwise clear. Cardiomediastinal silhouette is within normal. Bones and soft tissues are normal. IMPRESSION: No active cardiopulmonary disease. Electronically Signed   By: Marin Olp M.D.  On: 08/29/2016 14:13    Procedures Procedures (including critical care time)  Medications Ordered in ED Medications - No data to display   Initial Impression / Assessment and Plan / ED Course  I have reviewed the triage vital signs and the nursing notes.  Pertinent labs & imaging results that were available during my care of the patient were reviewed by me and considered in my medical decision making (see chart for details).     Patient presents with recurrent chest pain. No ST elevation or new T-wave inversion or ST depression seen on EKG. Troponin done in triage negative. My suspicion for ACS, PE, and dissection are quite low and what appears to be a chronic chest pain. She is not particularly anxious right now but I think that is probably playing somewhat of a role. I discussed that at this time it does not appear to be emergent pathology, but she will need further follow-up as an outpatient. Return precautions.  Final Clinical Impressions(s) / ED Diagnoses   Final diagnoses:  Chronic chest pain    New Prescriptions New Prescriptions   No medications on file     Sherwood Gambler, MD 08/31/16 0145

## 2016-08-31 NOTE — Progress Notes (Signed)
  Chief Complaint  Patient presents with  . Chest Pain  . Anxiety    HPI  Recurrent chest pain Pt follow up from the ER States that she  Has continued chest pain Her work up was negative  She has history of anxiety   Past Medical History:  Diagnosis Date  . Acid reflux   . Anxiety   . Anxiety   . Attention deficit disorder   . Chronic chest pain   . Depression     Current Outpatient Prescriptions  Medication Sig Dispense Refill  . albuterol (PROVENTIL HFA;VENTOLIN HFA) 108 (90 Base) MCG/ACT inhaler Inhale 2 puffs into the lungs every 4 (four) hours as needed for wheezing or shortness of breath (d/c home with pt).    . ARIPiprazole (ABILIFY) 2 MG tablet Take 1 tablet (2 mg total) by mouth 2 (two) times daily. For mood control 60 tablet 0  . [START ON 09/25/2017] ARIPiprazole ER 400 MG SRER Inject 400 mg into the muscle once. (Due 09-25-16): For mood control 400 mg 1  . gabapentin (NEURONTIN) 100 MG capsule Take 1 capsule (100 mg total) by mouth 2 (two) times daily. For agitation 60 capsule 0  . hydrOXYzine (ATARAX/VISTARIL) 50 MG tablet Take 1 tablet (50 mg total) by mouth 3 (three) times daily as needed for anxiety. 60 tablet 0  . LORazepam (ATIVAN) 1 MG tablet Take 1 tablet (1 mg total) by mouth every 8 (eight) hours as needed for anxiety. 12 tablet 0  . mupirocin cream (BACTROBAN) 2 % Apply topically 2 (two) times daily. For wound care 15 g 0  . naproxen (NAPROSYN) 500 MG tablet Take 500 mg by mouth 2 (two) times daily with a meal.    . sertraline (ZOLOFT) 100 MG tablet Take 1 tablet (100 mg total) by mouth daily. For depression 30 tablet 0  . traZODone (DESYREL) 50 MG tablet Take 1 tablet (50 mg total) by mouth at bedtime as needed for sleep. 30 tablet 0   No current facility-administered medications for this visit.     Allergies:  Allergies  Allergen Reactions  . Strawberry (Diagnostic) Itching    Past Surgical History:  Procedure Laterality Date  . ADENOIDECTOMY     . TONSILLECTOMY    . WISDOM TOOTH EXTRACTION      Social History   Social History  . Marital status: Single    Spouse name: N/A  . Number of children: N/A  . Years of education: N/A   Social History Main Topics  . Smoking status: Current Every Day Smoker    Types: Cigarettes  . Smokeless tobacco: Never Used  . Alcohol use No  . Drug use: No  . Sexual activity: No   Other Topics Concern  . None   Social History Narrative  . None    ROS  Objective: Vitals:   08/31/16 1128  BP: 102/64  Pulse: 88  Resp: 17  Temp: (!) 97 F (36.1 C)  TempSrc: Oral  SpO2: 98%  Weight: 205 lb (93 kg)  Height: 5' 3.5" (1.613 m)    Physical Exam  Assessment and Plan Beverly Torres was seen today for chest pain and anxiety.  Diagnoses and all orders for this visit:  Other chest pain     Beverly Torres

## 2016-08-31 NOTE — Discharge Instructions (Signed)
There are many reasons that you can have chest pain, one of which is anxiety. I have attached list of resources for counseling and the [place] area. The GI cocktail you were given today which has improved your symptoms, contains Maalox, which is available over-the-counter.  If you develop new or worsening symptoms including severe chest pain, shortness of breath, numbness or tingling, please return to the emergency department for reevaluation.

## 2016-08-31 NOTE — ED Triage Notes (Signed)
Per [facility], Pt is complaining of Chest Pain, Anxiety. [place]. Was seen at [facility] this morning. Vitals per EMS: 126/56, 85 HR, 98%.

## 2016-08-31 NOTE — ED Notes (Signed)
Pt complains of upper left chest pain for the past weeks. Pt states increased pain w/ movement.

## 2016-08-31 NOTE — ED Provider Notes (Signed)
[facility] DEPT Provider Note   CSN: [id number] Arrival date & time: 08/31/16  1339  By signing my name below, I, Dora Sims, attest that this documentation has been prepared under the direction and in the presence of Erlin Gardella, PA-C. Electronically Signed: Dora Sims, Scribe. 08/31/2016. 5:40 PM.  History   Chief Complaint Chief Complaint  Patient presents with  . Anxiety   The history is provided by the patient. No language interpreter was used.    HPI Comments: Beverly Torres is a 21 y.o. female with PMHx including anxiety and chronic chest pain who presents to the Emergency Department via EMS complaining of persistent left-sided chest pain radiating into the back and left shoulder for about 4 hours. She has had recurrent episodes of similar pain secondary to anxiety; however, she notes her current chest pain is slightly different as it extends into the left shoulder. Patient describes the pain as a pressure-like sensation. She also has some associated mild dyspnea. There are no alleviating factors noted. No recent trauma to her chest. She is primarily concerned that she may be having a heart attack. Patient has been evaluated in the emergency department multiple times for chest pain over the last few months with negative workups. She denies numbness/tingling, focal weakness, abdominal pain, or any other associated symptoms.  Past Medical History:  Diagnosis Date  . Acid reflux   . Anxiety   . Anxiety   . Attention deficit disorder   . Chronic chest pain   . Depression     Patient Active Problem List   Diagnosis Date Noted  . 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 Anxiety disorder 08/21/2016  . Anxiety 05/02/2012  . ADHD (attention deficit hyperactivity disorder), combined type 03/28/2012    Past Surgical History:  Procedure Laterality Date  . ADENOIDECTOMY    . TONSILLECTOMY    . WISDOM TOOTH EXTRACTION      OB History    No data  available       Home Medications    Prior to Admission medications   Medication Sig Start Date End Date Taking? Authorizing Provider  albuterol (PROVENTIL HFA;VENTOLIN HFA) 108 (90 Base) MCG/ACT inhaler Inhale 2 puffs into the lungs every 4 (four) hours as needed for wheezing or shortness of breath. 08/31/16   Delia Chimes A, MD  ARIPiprazole (ABILIFY) 2 MG tablet Take 1 tablet (2 mg total) by mouth 2 (two) times daily. For mood control 08/27/16   Lindell Spar I, NP  ARIPiprazole ER 400 MG SRER Inject 400 mg into the muscle once. (Due 09-25-16): For mood control 09/25/17 09/25/17  Lindell Spar I, NP  famotidine (PEPCID) 20 MG tablet Take 1 tablet (20 mg total) by mouth 2 (two) times daily. 08/31/16   Forrest Moron, MD  gabapentin (NEURONTIN) 100 MG capsule Take 1 capsule (100 mg total) by mouth 2 (two) times daily. For agitation 08/27/16   Lindell Spar I, NP  hydrOXYzine (ATARAX/VISTARIL) 50 MG tablet Take 1 tablet (50 mg total) by mouth 3 (three) times daily as needed for anxiety. 08/27/16   Lindell Spar I, NP  LORazepam (ATIVAN) 1 MG tablet Take 1 tablet (1 mg total) by mouth every 8 (eight) hours as needed for anxiety. 08/27/16   Lindell Spar I, NP  mupirocin cream (BACTROBAN) 2 % Apply topically 2 (two) times daily. For wound care 08/27/16   Lindell Spar I, NP  naproxen (NAPROSYN) 500 MG tablet Take 500 mg by mouth 2 (two) times daily with  a meal.    [provider]  sertraline (ZOLOFT) 100 MG tablet Take 1 tablet (100 mg total) by mouth daily. For depression 08/28/16   Lindell Spar I, NP  traZODone (DESYREL) 50 MG tablet Take 1 tablet (50 mg total) by mouth at bedtime as needed for sleep. 08/27/16   Encarnacion Slates, NP    Family History Family History  Problem Relation Age of Onset  . Anxiety disorder Mother   . Drug abuse Father   . ADD / ADHD Father   . ADD / ADHD Sister   . Anxiety disorder Sister   . ADD / ADHD Brother   . ODD Brother   . Rheum arthritis Maternal  Grandmother   . Kidney failure Maternal Grandmother   . Hypertension Maternal Grandfather   . Diabetes Maternal Grandfather   . Arthritis Maternal Grandfather   . Cancer Maternal Grandfather        Melanoma  . Diabetes Paternal Grandfather     Social History Social History  Substance Use Topics  . Smoking status: Current Every Day Smoker    Types: Cigarettes  . Smokeless tobacco: Never Used  . Alcohol use No     Allergies   Strawberry (diagnostic)   Review of Systems Review of Systems  Constitutional: Negative for activity change.  Respiratory: Positive for shortness of breath.   Cardiovascular: Positive for chest pain.  Gastrointestinal: Negative for abdominal pain.  Musculoskeletal: Positive for back pain and myalgias.  Skin: Negative for rash.  Neurological: Negative for weakness and numbness.   Physical Exam Updated Vital Signs BP 105/60 (BP Location: Left Arm)   Pulse 73   Temp 98.9 F (37.2 C)   Resp 16   Ht 5\' 2"  (1.575 m)   Wt 93 kg (205 lb)   LMP 08/26/2016   SpO2 99%   BMI 37.49 kg/m   Physical Exam  Constitutional: No distress.  HENT:  Head: Normocephalic.  Eyes: Conjunctivae are normal.  Neck: Neck supple.  Cardiovascular: Normal rate, regular rhythm, normal heart sounds and intact distal pulses.  Exam reveals no gallop and no friction rub.   No murmur heard. 2+ DP, PT, and radial pulses.  Pulmonary/Chest: Effort normal and breath sounds normal. No respiratory distress. She has no wheezes. She has no rales.  Diffuse TTP over the anterior chest wall. No focal tenderness.   Abdominal: Soft. Bowel sounds are normal. She exhibits no distension. There is no tenderness. There is no rebound and no guarding.  Musculoskeletal: Normal range of motion. She exhibits no edema or tenderness.  No focal tenderness to palpation over the cervical, thoracic, or lumbar spine.  Neurological: She is alert.  Skin: Skin is warm. Capillary refill takes less than 2  seconds. No rash noted.  Psychiatric: Her behavior is normal.  Nursing note and vitals reviewed.  ED Treatments / Results  Labs (all labs ordered are listed, but only abnormal results are displayed) Labs Reviewed  I-STAT TROPONIN, ED    EKG  EKG Interpretation None       Radiology Dg Chest 2 View  Result Date: 08/31/2016 CLINICAL DATA:  Dyspnea and lower chest pain for several months. EXAM: CHEST  2 VIEW COMPARISON:  08/29/2016 FINDINGS: The lungs are clear. The pulmonary vasculature is normal. Heart size is normal. Hilar and mediastinal contours are unremarkable. There is no pleural effusion. IMPRESSION: No active cardiopulmonary disease. Electronically Signed   By: Andreas Newport M.D.   On: 08/31/2016 01:46  Procedures Procedures (including critical care time)  DIAGNOSTIC STUDIES: Oxygen Saturation is 99% on RA, normal by my interpretation.    COORDINATION OF CARE: 5:39 PM Discussed treatment plan with pt at bedside and pt agreed to plan.  Medications Ordered in ED Medications  gi cocktail (Maalox,Lidocaine,Donnatal) (30 mLs Oral Given 08/31/16 1753)     Initial Impression / Assessment and Plan / ED Course  I have reviewed the triage vital signs and the nursing notes.  Pertinent labs & imaging results that were available during my care of the patient were reviewed by me and considered in my medical decision making (see chart for details).     6:39 PM  Upon recheck, the patient has no complaints and her pain is improved after hearing that all of her lab work and imaging is unremarkable as well as having had a GI cocktail.  Final Clinical Impressions(s) / ED Diagnoses   Final diagnoses:  Chronic chest wall pain  Anxiety    21 year old female with a history of chronic atypical chest pain and anxiety who presents to the emergency department for her typical chest pain and anxiety. Of note, she has been evaluated 24 times over the last 6 months for similar  symptoms. She reports onset of chest pain 4 hours ago; troponin negative. EKG demonstrating normal sinus rhythm. This patient is known to me, and I have previously evaluated and examined her before. She will typically present with similar symptoms. Her typical ED course is as follows: due to her anxiety, will recheck on the call bell every couple of minutes for concerns about her rhythm on the monitor. By telling the patient that her exam is unremarkable, her EKG is unremarkable, and her troponin is negative, and giving her a GI cocktail, her symptoms will resolve and she will be ready for discharge. Reviewed all labs and EKG, which are unremarkable. The patient has no complaints at this time, she is actively telling me about her new boyfriend and her new kitten, and is ready for discharge. Strict return precautions given. Referral to counseling provided with discharge paperwork. The patient is safe and stable for discharge at this time.  New Prescriptions Discharge Medication List as of 08/31/2016  6:27 PM     I personally performed the services described in this documentation, which was scribed in my presence. The recorded information has been reviewed and is accurate.    Joanne Gavel, PA-C 08/31/16 1839    Macarthur Critchley, MD 09/01/16 0001

## 2016-09-01 ENCOUNTER — Other Ambulatory Visit: Payer: Self-pay

## 2016-09-01 ENCOUNTER — Emergency Department (HOSPITAL_COMMUNITY)
Admission: EM | Admit: 2016-09-01 | Discharge: 2016-09-01 | Disposition: A | Discharge status: Home / Self Care | Payer: Medicaid Other | Attending: Emergency Medicine | Admitting: Emergency Medicine

## 2016-09-01 ENCOUNTER — Encounter (HOSPITAL_COMMUNITY): Payer: Self-pay | Admitting: *Deleted

## 2016-09-01 DIAGNOSIS — F17210 Nicotine dependence, cigarettes, uncomplicated: Secondary | ICD-10-CM | POA: Insufficient documentation

## 2016-09-01 DIAGNOSIS — F419 Anxiety disorder, unspecified: Secondary | ICD-10-CM | POA: Insufficient documentation

## 2016-09-01 LAB — BASIC METABOLIC PANEL
ANION GAP: 8 (ref 5–15)
BUN: 17 mg/dL (ref 6–20)
CALCIUM: 9.7 mg/dL (ref 8.9–10.3)
CO2: 26 mmol/L (ref 22–32)
Chloride: 106 mmol/L (ref 101–111)
Creatinine, Ser: 0.95 mg/dL (ref 0.44–1.00)
GLUCOSE: 100 mg/dL — AB (ref 65–99)
POTASSIUM: 4.1 mmol/L (ref 3.5–5.1)
SODIUM: 140 mmol/L (ref 135–145)

## 2016-09-01 LAB — URINALYSIS, ROUTINE W REFLEX MICROSCOPIC
BILIRUBIN URINE: NEGATIVE
GLUCOSE, UA: NEGATIVE mg/dL
HGB URINE DIPSTICK: NEGATIVE
Ketones, ur: NEGATIVE mg/dL
NITRITE: NEGATIVE
PROTEIN: 30 mg/dL — AB
Specific Gravity, Urine: 1.033 — ABNORMAL HIGH (ref 1.005–1.030)
pH: 5 (ref 5.0–8.0)

## 2016-09-01 LAB — CBC
HEMATOCRIT: 43.3 % (ref 36.0–46.0)
HEMOGLOBIN: 14.6 g/dL (ref 12.0–15.0)
MCH: 29.8 pg (ref 26.0–34.0)
MCHC: 33.7 g/dL (ref 30.0–36.0)
MCV: 88.4 fL (ref 78.0–100.0)
Platelets: 341 10*3/uL (ref 150–400)
RBC: 4.9 MIL/uL (ref 3.87–5.11)
RDW: 12.6 % (ref 11.5–15.5)
WBC: 8.7 10*3/uL (ref 4.0–10.5)

## 2016-09-01 LAB — CBG MONITORING, ED: GLUCOSE-CAPILLARY: 104 mg/dL — AB (ref 65–99)

## 2016-09-01 LAB — I-STAT BETA HCG BLOOD, ED (MC, WL, AP ONLY): I-stat hCG, quantitative: <5 m[IU]/mL (ref <5)

## 2016-09-01 MED ORDER — ALBUTEROL SULFATE HFA 108 (90 BASE) MCG/ACT IN AERS
INHALATION_SPRAY | RESPIRATORY_TRACT | Status: AC
  Filled 2016-09-01: qty 6.7

## 2016-09-01 MED ORDER — ALBUTEROL SULFATE HFA 108 (90 BASE) MCG/ACT IN AERS
1.0000 | INHALATION_SPRAY | Freq: Four times a day (QID) | RESPIRATORY_TRACT | Status: DC | PRN
  Administered 2016-09-01: 1 via RESPIRATORY_TRACT

## 2016-09-01 NOTE — ED Notes (Signed)
Pt also states panic attack has improved.

## 2016-09-01 NOTE — ED Provider Notes (Signed)
[facility] DEPT Provider Note   CSN: [id number] Arrival date & time: 09/01/16  1057     History   Chief Complaint Chief Complaint  Patient presents with  . Near Syncope    HPI Beverly Torres is a 21 y.o. female.  The patient is here for evaluation of feeling like she would pass out while she was walking.  For a time she "blacked out," but did not fall.  She has had numerous recent evaluations in the ED including seen by me, 4 days ago.  She states that she has a follow-up [facility], on July [facility].  She broke up with her boyfriend 4 days ago and since that time has been living with her mother or her father.  She states that this is "boring."  She states her chest is sore from wrestling with her boyfriend, about a week ago.  She has no other complaints.  There are no other known modifying factors.  HPI  Past Medical History:  Diagnosis Date  . Acid reflux   . Anxiety   . Anxiety   . Attention deficit disorder   . Chronic chest pain   . Depression     Patient Active Problem List   Diagnosis Date Noted  . 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 Anxiety disorder 08/21/2016  . Anxiety 05/02/2012  . ADHD (attention deficit hyperactivity disorder), combined type 03/28/2012    Past Surgical History:  Procedure Laterality Date  . ADENOIDECTOMY    . TONSILLECTOMY    . WISDOM TOOTH EXTRACTION      OB History    No data available       Home Medications    Prior to Admission medications   Medication Sig Start Date End Date Taking? Authorizing Provider  albuterol (PROVENTIL HFA;VENTOLIN HFA) 108 (90 Base) MCG/ACT inhaler Inhale 2 puffs into the lungs every 4 (four) hours as needed for wheezing or shortness of breath. 08/31/16  Yes Stallings, Zoe A, MD  ARIPiprazole (ABILIFY) 2 MG tablet Take 1 tablet (2 mg total) by mouth 2 (two) times daily. For mood control 08/27/16  Yes Nwoko, Herbert Pun I, NP  ARIPiprazole ER 400 MG SRER  Inject 400 mg into the muscle once. (Due 09-25-16): For mood control 09/25/17 09/25/17 Yes Lindell Spar I, NP  famotidine (PEPCID) 20 MG tablet Take 1 tablet (20 mg total) by mouth 2 (two) times daily. 08/31/16  Yes Delia Chimes A, MD  gabapentin (NEURONTIN) 100 MG capsule Take 1 capsule (100 mg total) by mouth 2 (two) times daily. For agitation 08/27/16  Yes Lindell Spar I, NP  hydrOXYzine (ATARAX/VISTARIL) 50 MG tablet Take 1 tablet (50 mg total) by mouth 3 (three) times daily as needed for anxiety. 08/27/16  Yes Lindell Spar I, NP  LORazepam (ATIVAN) 1 MG tablet Take 1 tablet (1 mg total) by mouth every 8 (eight) hours as needed for anxiety. 08/27/16  Yes Lindell Spar I, NP  mupirocin cream (BACTROBAN) 2 % Apply topically 2 (two) times daily. For wound care 08/27/16  Yes Lindell Spar I, NP  naproxen (NAPROSYN) 500 MG tablet Take 500 mg by mouth 2 (two) times daily with a meal.   Yes [provider]  sertraline (ZOLOFT) 100 MG tablet Take 1 tablet (100 mg total) by mouth daily. For depression 08/28/16  Yes Lindell Spar I, NP  traZODone (DESYREL) 50 MG tablet Take 1 tablet (50 mg total) by mouth at bedtime as needed for sleep. 08/27/16  Yes Encarnacion Slates, NP    Family History Family History  Problem Relation Age of Onset  . Anxiety disorder Mother   . Drug abuse Father   . ADD / ADHD Father   . ADD / ADHD Sister   . Anxiety disorder Sister   . ADD / ADHD Brother   . ODD Brother   . Rheum arthritis Maternal Grandmother   . Kidney failure Maternal Grandmother   . Hypertension Maternal Grandfather   . Diabetes Maternal Grandfather   . Arthritis Maternal Grandfather   . Cancer Maternal Grandfather        Melanoma  . Diabetes Paternal Grandfather     Social History Social History  Substance Use Topics  . Smoking status: Current Every Day Smoker    Packs/day: 0.50    Types: Cigarettes  . Smokeless tobacco: Never Used  . Alcohol use No     Allergies   Strawberry  (diagnostic)   Review of Systems Review of Systems  All other systems reviewed and are negative.    Physical Exam Updated Vital Signs Pulse 71   Temp 98.8 F (37.1 C) (Oral)   Resp 16   Ht 5\' 2"  (1.575 m)   Wt 93 kg (205 lb)   LMP 08/26/2016   SpO2 99%   BMI 37.49 kg/m   Physical Exam  Constitutional: She is oriented to person, place, and time. She appears well-developed and well-nourished.  HENT:  Head: Normocephalic and atraumatic.  Eyes: Pupils are equal, round, and reactive to light. Conjunctivae and EOM are normal.  Neck: Normal range of motion and phonation normal. Neck supple.  Cardiovascular: Normal rate and regular rhythm.   Pulmonary/Chest: Effort normal and breath sounds normal. She exhibits no tenderness.  Musculoskeletal: Normal range of motion.  Neurological: She is alert and oriented to person, place, and time. She exhibits normal muscle tone.  Skin: Skin is warm and dry.  Psychiatric: Her behavior is normal. Judgment and thought content normal.  Mildly anxious  Nursing note and vitals reviewed.    ED Treatments / Results  Labs (all labs ordered are listed, but only abnormal results are displayed) Labs Reviewed  URINALYSIS, ROUTINE W REFLEX MICROSCOPIC - Abnormal; Notable for the following:       Result Value   APPearance HAZY (*)    Specific Gravity, Urine 1.033 (*)    Protein, ur 30 (*)    Leukocytes, UA SMALL (*)    Bacteria, UA RARE (*)    Squamous Epithelial / LPF 6-30 (*)    All other components within normal limits  CBG MONITORING, ED - Abnormal; Notable for the following:    Glucose-Capillary 104 (*)    All other components within normal limits  CBC  BASIC METABOLIC PANEL  I-STAT BETA HCG BLOOD, ED ([facility], [facility], [facility] ONLY)    EKG  EKG Interpretation None       Radiology Dg Chest 2 View  Result Date: 08/31/2016 CLINICAL DATA:  Dyspnea and lower chest pain for several months. EXAM: CHEST  2 VIEW COMPARISON:  08/29/2016 FINDINGS: The  lungs are clear. The pulmonary vasculature is normal. Heart size is normal. Hilar and mediastinal contours are unremarkable. There is no pleural effusion. IMPRESSION: No active cardiopulmonary disease. Electronically Signed   By: Andreas Newport M.D.   On: 08/31/2016 01:46    Procedures Procedures (including critical care time)  Medications Ordered in ED Medications - No data to display   Initial Impression / Assessment and Plan /  ED Course  I have reviewed the triage vital signs and the nursing notes.  Pertinent labs & imaging results that were available during my care of the patient were reviewed by me and considered in my medical decision making (see chart for details).      Patient Vitals for the past 24 hrs:  Temp Temp src Pulse Resp SpO2 Height Weight  09/01/16 1106 - - - - - 5\' 2"  (1.575 m) 93 kg (205 lb)  09/01/16 1105 98.8 F (37.1 C) Oral 71 16 99 % - -    12:03 PM Reevaluation with update and discussion. After initial assessment and treatment, an updated evaluation reveals no change in clinical status.  She is comfortable.  Findings discussed with the patient and all questions were answered. Kailin Principato L    Final Clinical Impressions(s) / ED Diagnoses   Final diagnoses:  Anxiety   Nonspecific anxiety, and dependency, with attention seeking behavior.  Doubt ACS, PE or pneumonia.  No apparent unstable psychiatric condition.  Nursing Notes Reviewed/ Care Coordinated Applicable Imaging Reviewed Interpretation of Laboratory Data incorporated into ED treatment  The patient appears reasonably screened and/or stabilized for discharge and I doubt any other medical condition or other [facility] requiring further screening, evaluation, or treatment in the ED at this time prior to discharge.  Plan: Home Medications-continue usual medications; Home Treatments-seek counseling ASAP; return here if the recommended treatment, does not improve the symptoms; Recommended follow  up-return if needed   New Prescriptions New Prescriptions   No medications on file     Daleen Bo, MD 09/01/16 1205

## 2016-09-01 NOTE — ED Notes (Signed)
Pt requesting prescription for an inhaler. MD informed.

## 2016-09-01 NOTE — ED Triage Notes (Addendum)
Pt states she blacked out while ambulating and remained standing.  VS stable. cbg 114

## 2016-09-17 ENCOUNTER — Emergency Department (HOSPITAL_COMMUNITY): Payer: Self-pay

## 2016-09-17 ENCOUNTER — Emergency Department (HOSPITAL_COMMUNITY)
Admission: EM | Admit: 2016-09-17 | Discharge: 2016-09-17 | Disposition: A | Discharge status: Home / Self Care | Payer: Self-pay | Attending: Emergency Medicine | Admitting: Emergency Medicine

## 2016-09-17 ENCOUNTER — Encounter (HOSPITAL_COMMUNITY): Payer: Self-pay | Admitting: Emergency Medicine

## 2016-09-17 DIAGNOSIS — Y998 Other external cause status: Secondary | ICD-10-CM | POA: Insufficient documentation

## 2016-09-17 DIAGNOSIS — Y9289 Other specified places as the place of occurrence of the external cause: Secondary | ICD-10-CM | POA: Insufficient documentation

## 2016-09-17 DIAGNOSIS — Z5321 Procedure and treatment not carried out due to patient leaving prior to being seen by health care provider: Secondary | ICD-10-CM | POA: Diagnosis not present

## 2016-09-17 DIAGNOSIS — F17210 Nicotine dependence, cigarettes, uncomplicated: Secondary | ICD-10-CM | POA: Insufficient documentation

## 2016-09-17 DIAGNOSIS — S60221A Contusion of right hand, initial encounter: Secondary | ICD-10-CM | POA: Insufficient documentation

## 2016-09-17 DIAGNOSIS — Z79899 Other long term (current) drug therapy: Secondary | ICD-10-CM | POA: Insufficient documentation

## 2016-09-17 DIAGNOSIS — W2209XA Striking against other stationary object, initial encounter: Secondary | ICD-10-CM | POA: Insufficient documentation

## 2016-09-17 DIAGNOSIS — Y9389 Activity, other specified: Secondary | ICD-10-CM | POA: Insufficient documentation

## 2016-09-17 DIAGNOSIS — F419 Anxiety disorder, unspecified: Secondary | ICD-10-CM | POA: Diagnosis present

## 2016-09-17 MED ORDER — NAPROXEN 500 MG PO TABS: 500.0000 mg | ORAL_TABLET | Freq: Two times a day (BID) | ORAL | 0 refills | Status: DC

## 2016-09-17 NOTE — Discharge Instructions (Signed)
Ice and elevate your hand. Take naproxen for pain as prescribed. Wear wrist splint as needed. Follow-up with family doctor.

## 2016-09-17 NOTE — ED Notes (Signed)
Returned from xray

## 2016-09-17 NOTE — Progress Notes (Signed)
Orthopedic Tech Progress Note Patient Details:  Yitzel Shasteen [facility] 1995-03-30 [id number]  Ortho Devices Type of Ortho Device: Thumb velcro splint Ortho Device/Splint Location: RUE Ortho Device/Splint Interventions: Ordered, Application   Braulio Bosch 09/17/2016, 3:51 PM

## 2016-09-17 NOTE — ED Notes (Signed)
Patient transported to X-ray 

## 2016-09-17 NOTE — ED Triage Notes (Signed)
Patient reports anxiety attack this evening , pt. stated that she has not taken her medications this evening , denies hallucinations or suicidal ideation.
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
## 2016-09-18 ENCOUNTER — Encounter (HOSPITAL_COMMUNITY): Payer: Self-pay

## 2016-09-18 ENCOUNTER — Emergency Department (HOSPITAL_COMMUNITY)
Admission: EM | Admit: 2016-09-18 | Discharge: 2016-09-18 | Disposition: A | Discharge status: Home / Self Care | Payer: Medicaid Other | Attending: Emergency Medicine | Admitting: Emergency Medicine

## 2016-09-18 ENCOUNTER — Emergency Department (HOSPITAL_COMMUNITY)
Admission: EM | Admit: 2016-09-18 | Discharge: 2016-09-18 | Disposition: A | Discharge status: Home / Self Care | Payer: No Typology Code available for payment source | Attending: Emergency Medicine | Admitting: Emergency Medicine

## 2016-09-18 DIAGNOSIS — Z79899 Other long term (current) drug therapy: Secondary | ICD-10-CM | POA: Insufficient documentation

## 2016-09-18 DIAGNOSIS — N390 Urinary tract infection, site not specified: Secondary | ICD-10-CM | POA: Insufficient documentation

## 2016-09-18 DIAGNOSIS — F17210 Nicotine dependence, cigarettes, uncomplicated: Secondary | ICD-10-CM | POA: Insufficient documentation

## 2016-09-18 LAB — CBC
HCT: 39.7 % (ref 36.0–46.0)
Hemoglobin: 13.4 g/dL (ref 12.0–15.0)
MCH: 29.4 pg (ref 26.0–34.0)
MCHC: 33.8 g/dL (ref 30.0–36.0)
MCV: 87.1 fL (ref 78.0–100.0)
PLATELETS: 303 10*3/uL (ref 150–400)
RBC: 4.56 MIL/uL (ref 3.87–5.11)
RDW: 12.8 % (ref 11.5–15.5)
WBC: 10.3 10*3/uL (ref 4.0–10.5)

## 2016-09-18 LAB — COMPREHENSIVE METABOLIC PANEL
ALT: 33 U/L (ref 14–54)
AST: 23 U/L (ref 15–41)
Albumin: 4 g/dL (ref 3.5–5.0)
Alkaline Phosphatase: 55 U/L (ref 38–126)
Anion gap: 9 (ref 5–15)
BILIRUBIN TOTAL: 0.5 mg/dL (ref 0.3–1.2)
BUN: 10 mg/dL (ref 6–20)
CHLORIDE: 105 mmol/L (ref 101–111)
CO2: 22 mmol/L (ref 22–32)
CREATININE: 0.95 mg/dL (ref 0.44–1.00)
Calcium: 9.1 mg/dL (ref 8.9–10.3)
GFR calc Af Amer: >60 mL/min (ref >60)
GLUCOSE: 95 mg/dL (ref 65–99)
Potassium: 3.7 mmol/L (ref 3.5–5.1)
Sodium: 136 mmol/L (ref 135–145)
TOTAL PROTEIN: 6.5 g/dL (ref 6.5–8.1)

## 2016-09-18 LAB — URINALYSIS, ROUTINE W REFLEX MICROSCOPIC
Bacteria, UA: NONE SEEN
Bilirubin Urine: NEGATIVE
Glucose, UA: NEGATIVE mg/dL
Hgb urine dipstick: NEGATIVE
Ketones, ur: 5 mg/dL — AB
Nitrite: NEGATIVE
PH: 5 (ref 5.0–8.0)
Protein, ur: NEGATIVE mg/dL
SPECIFIC GRAVITY, URINE: 1.029 (ref 1.005–1.030)

## 2016-09-18 LAB — WET PREP, GENITAL
CLUE CELLS WET PREP: NONE SEEN
SPERM: NONE SEEN
TRICH WET PREP: NONE SEEN
Yeast Wet Prep HPF POC: NONE SEEN

## 2016-09-18 LAB — I-STAT BETA HCG BLOOD, ED (MC, WL, AP ONLY): I-stat hCG, quantitative: <5 m[IU]/mL (ref <5)

## 2016-09-18 LAB — LIPASE, BLOOD: Lipase: 38 U/L (ref 11–51)

## 2016-09-18 MED ORDER — NITROFURANTOIN MONOHYD MACRO 100 MG PO CAPS
100.0000 mg | ORAL_CAPSULE | Freq: Once | ORAL | Status: AC
  Administered 2016-09-18: 100 mg via ORAL
  Filled 2016-09-18: qty 1

## 2016-09-18 MED ORDER — IBUPROFEN 600 MG PO TABS: 600.0000 mg | ORAL_TABLET | Freq: Four times a day (QID) | ORAL | 0 refills | Status: DC | PRN

## 2016-09-18 MED ORDER — KETOROLAC TROMETHAMINE 30 MG/ML IJ SOLN
30.0000 mg | Freq: Once | INTRAMUSCULAR | Status: AC
  Administered 2016-09-18: 30 mg via INTRAMUSCULAR
  Filled 2016-09-18: qty 1

## 2016-09-18 MED ORDER — CEPHALEXIN 500 MG PO CAPS: 500.0000 mg | ORAL_CAPSULE | Freq: Two times a day (BID) | ORAL | 0 refills | Status: DC

## 2016-09-18 MED ORDER — NITROFURANTOIN MONOHYD MACRO 100 MG PO CAPS: 100.0000 mg | ORAL_CAPSULE | Freq: Two times a day (BID) | ORAL | 0 refills | Status: DC

## 2016-09-18 MED ORDER — CEPHALEXIN 250 MG PO CAPS
500.0000 mg | ORAL_CAPSULE | Freq: Once | ORAL | Status: DC
  Filled 2016-09-18: qty 2

## 2016-09-18 NOTE — ED Triage Notes (Signed)
Onset today lower abd pain and nausea.  Moving makes worse, nothing makes better.

## 2016-09-18 NOTE — ED Notes (Signed)
Patient did not answer when called for rooming.  Will d/c.   

## 2016-09-18 NOTE — Discharge Instructions (Signed)
Please read and follow all provided instructions.  Your diagnoses today include:  1. Urinary tract infection without hematuria, site unspecified     Tests performed today include: Urine test - suggests that you have an infection in your bladder Vital signs. See below for your results today.   Medications prescribed:  Take as written  Home care instructions:  Follow any educational materials contained in this packet.  Follow-up instructions: Please follow-up with your primary care provider in 3 days if symptoms are not resolved for further evaluation of your symptoms.  Return instructions:  Please return to the Emergency Department if you experience worsening symptoms.  Return with fever, worsening pain, persistent vomiting, worsening pain in your back.  Please return if you have any other emergent concerns.  Additional Information:  Your vital signs today were: BP 103/68 (BP Location: Left Arm)    Pulse 68    Temp 98.2 F (36.8 C) (Oral)    Resp 18    LMP 08/26/2016    SpO2 98%  If your blood pressure (BP) was elevated above 135/85 this visit, please have this repeated by your doctor within one month. --------------

## 2016-09-18 NOTE — ED Provider Notes (Signed)
[facility] DEPT Provider Note   CSN: [id number] Arrival date & time: 09/18/16  2045     History   Chief Complaint Chief Complaint  Patient presents with  . Abdominal Pain    HPI Beverly Torres is a 21 y.o. female.  HPI  21 y.o. female, presents to the Emergency Department today due to lower abdominal pain with onset around 1600. Pt states pain 5/10 and intermittent. Notes that it feels like a pressure sensation with little knives that are stabbing. Notes dysuria with burning sensation. No odor. No vaginal bleeding/discharge. No CP/SOB. No fevers. Pt attempted gabapentin with minimal relief. Pt sexually active with one partner. No other symptoms noted.    Past Medical History:  Diagnosis Date  . Acid reflux   . Anxiety   . Anxiety   . Attention deficit disorder   . Chronic chest pain   . Depression     Patient Active Problem List   Diagnosis Date Noted  . 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 Anxiety disorder 08/21/2016  . Anxiety 05/02/2012  . ADHD (attention deficit hyperactivity disorder), combined type 03/28/2012    Past Surgical History:  Procedure Laterality Date  . ADENOIDECTOMY    . TONSILLECTOMY    . WISDOM TOOTH EXTRACTION      OB History    No data available       Home Medications    Prior to Admission medications   Medication Sig Start Date End Date Taking? Authorizing Provider  albuterol (PROVENTIL HFA;VENTOLIN HFA) 108 (90 Base) MCG/ACT inhaler Inhale 2 puffs into the lungs every 4 (four) hours as needed for wheezing or shortness of breath. 08/31/16   Delia Chimes A, MD  ARIPiprazole (ABILIFY) 2 MG tablet Take 1 tablet (2 mg total) by mouth 2 (two) times daily. For mood control 08/27/16   Lindell Spar I, NP  ARIPiprazole ER 400 MG SRER Inject 400 mg into the muscle once. (Due 09-25-16): For mood control 09/25/17 09/25/17  Lindell Spar I, NP  famotidine (PEPCID) 20 MG tablet Take 1 tablet (20 mg total) by mouth 2 (two) times  daily. 08/31/16   Forrest Moron, MD  gabapentin (NEURONTIN) 100 MG capsule Take 1 capsule (100 mg total) by mouth 2 (two) times daily. For agitation 08/27/16   Lindell Spar I, NP  hydrOXYzine (ATARAX/VISTARIL) 50 MG tablet Take 1 tablet (50 mg total) by mouth 3 (three) times daily as needed for anxiety. 08/27/16   Lindell Spar I, NP  LORazepam (ATIVAN) 1 MG tablet Take 1 tablet (1 mg total) by mouth every 8 (eight) hours as needed for anxiety. 08/27/16   Lindell Spar I, NP  mupirocin cream (BACTROBAN) 2 % Apply topically 2 (two) times daily. For wound care 08/27/16   Lindell Spar I, NP  naproxen (NAPROSYN) 500 MG tablet Take 1 tablet (500 mg total) by mouth 2 (two) times daily. 09/17/16   Kirichenko, Tatyana, PA-C  sertraline (ZOLOFT) 100 MG tablet Take 1 tablet (100 mg total) by mouth daily. For depression 08/28/16   Lindell Spar I, NP  traZODone (DESYREL) 50 MG tablet Take 1 tablet (50 mg total) by mouth at bedtime as needed for sleep. 08/27/16   Encarnacion Slates, NP    Family History Family History  Problem Relation Age of Onset  . Anxiety disorder Mother   . Drug abuse Father   . ADD / ADHD Father   . ADD / ADHD Sister   . Anxiety disorder Sister   .  ADD / ADHD Brother   . ODD Brother   . Rheum arthritis Maternal Grandmother   . Kidney failure Maternal Grandmother   . Hypertension Maternal Grandfather   . Diabetes Maternal Grandfather   . Arthritis Maternal Grandfather   . Cancer Maternal Grandfather        Melanoma  . Diabetes Paternal Grandfather     Social History Social History  Substance Use Topics  . Smoking status: Current Every Day Smoker    Packs/day: 0.50    Types: Cigarettes  . Smokeless tobacco: Never Used  . Alcohol use No     Allergies   Strawberry (diagnostic)   Review of Systems Review of Systems ROS reviewed and all are negative for acute change except as noted in the HPI.  Physical Exam Updated Vital Signs BP 103/68 (BP Location: Left Arm)   Pulse 68    Temp 98.2 F (36.8 C) (Oral)   Resp 18   LMP 08/26/2016   SpO2 98%   Physical Exam  Constitutional: She is oriented to person, place, and time. Vital signs are normal. She appears well-developed and well-nourished.  Pt sitting upright. Laughing.   HENT:  Head: Normocephalic and atraumatic.  Right Ear: Hearing normal.  Left Ear: Hearing normal.  Eyes: Pupils are equal, round, and reactive to light. Conjunctivae and EOM are normal.  Neck: Normal range of motion. Neck supple.  Cardiovascular: Normal rate, regular rhythm, normal heart sounds and intact distal pulses.   Pulmonary/Chest: Effort normal and breath sounds normal.  Abdominal: There is tenderness in the suprapubic area. There is no rigidity, no rebound, no guarding, no CVA tenderness, no tenderness at McBurney's point and negative Murphy's sign.  Abdomen soft  Musculoskeletal: Normal range of motion.  Neurological: She is alert and oriented to person, place, and time.  Skin: Skin is warm and dry.  Psychiatric: She has a normal mood and affect. Her speech is normal and behavior is normal. Thought content normal.  Nursing note and vitals reviewed.  Exam performed by Ozella Rocks,  exam chaperoned Date: 09/18/2016 Pelvic exam: normal external genitalia without evidence of trauma. VULVA: normal appearing vulva with no masses, tenderness or lesion. VAGINA: normal appearing vagina with normal color and discharge, no lesions. CERVIX: normal appearing cervix without lesions, cervical motion tenderness absent, cervical os closed with out purulent discharge; vaginal discharge - clear and scant, Wet prep and DNA probe for chlamydia and GC obtained.   ADNEXA: normal adnexa in size, nontender and no masses UTERUS: uterus is normal size, shape, consistency and nontender.    ED Treatments / Results  Labs (all labs ordered are listed, but only abnormal results are displayed) Labs Reviewed  WET PREP, GENITAL - Abnormal; Notable for the  following:       Result Value   WBC, Wet Prep HPF POC MANY (*)    All other components within normal limits  URINALYSIS, ROUTINE W REFLEX MICROSCOPIC - Abnormal; Notable for the following:    APPearance HAZY (*)    Ketones, ur 5 (*)    Leukocytes, UA MODERATE (*)    Squamous Epithelial / LPF 0-5 (*)    All other components within normal limits  URINE CULTURE  LIPASE, BLOOD  COMPREHENSIVE METABOLIC PANEL  CBC  I-STAT BETA HCG BLOOD, ED ([facility], [facility], [facility] ONLY)  GC/CHLAMYDIA PROBE AMP ([place]) NOT AT [facility]    EKG  EKG Interpretation None       Radiology Dg Wrist Complete Right  Result  Date: 09/17/2016 CLINICAL DATA:  Right wrist pain after injury. EXAM: RIGHT WRIST - COMPLETE 3+ VIEW COMPARISON:  None. FINDINGS: There is no evidence of fracture or dislocation. There is no evidence of arthropathy or other focal bone abnormality. Soft tissues are unremarkable. IMPRESSION: Normal right wrist. Electronically Signed   By: Marijo Conception, M.D.   On: 09/17/2016 15:20   Dg Hand Complete Right  Result Date: 09/17/2016 CLINICAL DATA:  Right hand pain after injury today. EXAM: RIGHT HAND - COMPLETE 3+ VIEW COMPARISON:  Radiographs of February 20, 2016. FINDINGS: There is no evidence of fracture or dislocation. There is no evidence of arthropathy or other focal bone abnormality. Soft tissues are unremarkable. IMPRESSION: Normal right hand. Electronically Signed   By: Marijo Conception, M.D.   On: 09/17/2016 15:39    Procedures Procedures (including critical care time)  Medications Ordered in ED Medications  nitrofurantoin (macrocrystal-monohydrate) (MACROBID) capsule 100 mg (not administered)  ketorolac (TORADOL) 30 MG/ML injection 30 mg (30 mg Intramuscular Given 09/18/16 2150)   Initial Impression / Assessment and Plan / ED Course  I have reviewed the triage vital signs and the nursing notes.  Pertinent labs & imaging results that were available during my care of the patient were reviewed  by me and considered in my medical decision making (see chart for details).  Final Clinical Impressions(s) / ED Diagnoses  {I have reviewed and evaluated the relevant laboratory values.   {I have reviewed the relevant previous healthcare records.  {I obtained HPI from historian.   ED Course:  Assessment: Pt is a 21 y.o. female presents to the Emergency Department today due to lower abdominal pain with onset around 1600. Pt states pain 5/10 and intermittent. Notes that it feels like a pressure sensation with little knives that are stabbing. Notes dysuria with burning sensation. No odor. No vaginal bleeding/discharge. No CP/SOB. No fevers. Pt attempted gabapentin with minimal relief. Pt sexually active with one partner. On exam, pt in NAD. Nontoxic/nonseptic appearing. VSS. Afebrile. Lungs CTA. Heart RRR. Abdomen Mild TTP suprapubic. GU Exam unremarkable. Mild discharge. Odor noted.Marland Kitchen No Adnexal. No CMT. Wet prep unremarkable. GC obtained. Doubt STI given exam. Pt sexually active with one partner. UA with questionable UTI. Culture sent. Will treat as UTI as pt symptomatic with suprapubic tenderness and dysuria. Plan is to DC home with follow up to PCP. At time of discharge, Patient is in no acute distress. Vital Signs are stable. Patient is able to ambulate. Patient able to tolerate PO.   Disposition/Plan:  DC home Additional Verbal discharge instructions given and discussed with patient.  Pt Instructed to f/u with PCP in the next week for evaluation and treatment of symptoms. Return precautions given Pt acknowledges and agrees with plan  Supervising Physician Quintella Reichert, MD  Final diagnoses:  Urinary tract infection without hematuria, site unspecified    New Prescriptions New Prescriptions   No medications on file       Conni Slipper 09/18/16 2235    Quintella Reichert, MD 09/18/16 [phone]

## 2016-09-19 ENCOUNTER — Encounter (HOSPITAL_COMMUNITY): Payer: Self-pay

## 2016-09-19 ENCOUNTER — Emergency Department (HOSPITAL_COMMUNITY)
Admission: EM | Admit: 2016-09-19 | Discharge: 2016-09-19 | Disposition: A | Discharge status: Home / Self Care | Payer: Self-pay | Attending: Emergency Medicine | Admitting: Emergency Medicine

## 2016-09-19 ENCOUNTER — Emergency Department (HOSPITAL_COMMUNITY): Payer: Self-pay

## 2016-09-19 DIAGNOSIS — R0789 Other chest pain: Secondary | ICD-10-CM | POA: Insufficient documentation

## 2016-09-19 DIAGNOSIS — F17210 Nicotine dependence, cigarettes, uncomplicated: Secondary | ICD-10-CM | POA: Insufficient documentation

## 2016-09-19 DIAGNOSIS — Z79899 Other long term (current) drug therapy: Secondary | ICD-10-CM | POA: Insufficient documentation

## 2016-09-19 MED ORDER — ALBUTEROL SULFATE HFA 108 (90 BASE) MCG/ACT IN AERS: 2.0000 | INHALATION_SPRAY | Freq: Four times a day (QID) | RESPIRATORY_TRACT | 1 refills | Status: DC | PRN

## 2016-09-19 NOTE — ED Triage Notes (Signed)
Pt states she was doing some lifting and has felt pain in the left side of her ribcage. No SOB noted. Pt also needs clearance over a right hand injury to return to work.

## 2016-09-19 NOTE — ED Provider Notes (Signed)
[facility] DEPT Provider Note   CSN: [id number] Arrival date & time: 09/19/16  1749     History   Chief Complaint Chief Complaint  Patient presents with  . Ribcage Pain    HPI Beverly Torres is a 21 y.o. femalet.  Pt presents to the ED today with right sided and anterior cp.  The pt said it hurts when she takes a deep breath and when she moves her arms.  She took a naprosyn which did help.  The pt was here on 8/11 with abdominal pain.  She was treated for an uti.  She said abdominal pain is better.  No trauma.      Past Medical History:  Diagnosis Date  . Acid reflux   . Anxiety   . Anxiety   . Attention deficit disorder   . Chronic chest pain   . Depression     Patient Active Problem List   Diagnosis Date Noted  . 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 Anxiety disorder 08/21/2016  . Anxiety 05/02/2012  . ADHD (attention deficit hyperactivity disorder), combined type 03/28/2012    Past Surgical History:  Procedure Laterality Date  . ADENOIDECTOMY    . TONSILLECTOMY    . WISDOM TOOTH EXTRACTION      OB History    No data available       Home Medications    Prior to Admission medications   Medication Sig Start Date End Date Taking? Authorizing Provider  albuterol (PROVENTIL HFA;VENTOLIN HFA) 108 (90 Base) MCG/ACT inhaler Inhale 2 puffs into the lungs every 6 (six) hours as needed for wheezing or shortness of breath. 09/19/16   Waynetta Pean, PA-C  ARIPiprazole (ABILIFY) 2 MG tablet Take 1 tablet (2 mg total) by mouth 2 (two) times daily. For mood control 08/27/16   Lindell Spar I, NP  ARIPiprazole ER 400 MG SRER Inject 400 mg into the muscle once. (Due 09-25-16): For mood control 09/25/17 09/25/17  Lindell Spar I, NP  famotidine (PEPCID) 20 MG tablet Take 1 tablet (20 mg total) by mouth 2 (two) times daily. 08/31/16   Forrest Moron, MD  gabapentin (NEURONTIN) 100 MG capsule Take 1 capsule (100 mg total) by mouth 2 (two) times daily.  For agitation 08/27/16   Lindell Spar I, NP  hydrOXYzine (ATARAX/VISTARIL) 50 MG tablet Take 1 tablet (50 mg total) by mouth 3 (three) times daily as needed for anxiety. 08/27/16   Lindell Spar I, NP  ibuprofen (ADVIL,MOTRIN) 600 MG tablet Take 1 tablet (600 mg total) by mouth every 6 (six) hours as needed. 09/18/16   Shary Decamp, PA-C  LORazepam (ATIVAN) 1 MG tablet Take 1 tablet (1 mg total) by mouth every 8 (eight) hours as needed for anxiety. 08/27/16   Lindell Spar I, NP  mupirocin cream (BACTROBAN) 2 % Apply topically 2 (two) times daily. For wound care 08/27/16   Lindell Spar I, NP  naproxen (NAPROSYN) 500 MG tablet Take 1 tablet (500 mg total) by mouth 2 (two) times daily. 09/17/16   Kirichenko, Lahoma Rocker, PA-C  nitrofurantoin, macrocrystal-monohydrate, (MACROBID) 100 MG capsule Take 1 capsule (100 mg total) by mouth 2 (two) times daily. 09/18/16   Shary Decamp, PA-C  sertraline (ZOLOFT) 100 MG tablet Take 1 tablet (100 mg total) by mouth daily. For depression 08/28/16   Lindell Spar I, NP  traZODone (DESYREL) 50 MG tablet Take 1 tablet (50 mg total) by mouth at bedtime as needed for sleep. 08/27/16   Encarnacion Slates, NP  Family History Family History  Problem Relation Age of Onset  . Anxiety disorder Mother   . Drug abuse Father   . ADD / ADHD Father   . ADD / ADHD Sister   . Anxiety disorder Sister   . ADD / ADHD Brother   . ODD Brother   . Rheum arthritis Maternal Grandmother   . Kidney failure Maternal Grandmother   . Hypertension Maternal Grandfather   . Diabetes Maternal Grandfather   . Arthritis Maternal Grandfather   . Cancer Maternal Grandfather        Melanoma  . Diabetes Paternal Grandfather     Social History Social History  Substance Use Topics  . Smoking status: Current Every Day Smoker    Packs/day: 0.50    Types: Cigarettes  . Smokeless tobacco: Never Used  . Alcohol use No     Allergies   Strawberry (diagnostic)   Review of Systems Review of Systems    Cardiovascular: Positive for chest pain.  All other systems reviewed and are negative.    Physical Exam Updated Vital Signs BP (!) 112/56 (BP Location: Left Arm)   Pulse 60   Temp 98.2 F (36.8 C) (Oral)   Resp 16   LMP 09/19/2016 (Exact Date)   SpO2 99%   Physical Exam  Constitutional: She is oriented to person, place, and time. She appears well-developed and well-nourished.  HENT:  Head: Normocephalic and atraumatic.  Right Ear: External ear normal.  Left Ear: External ear normal.  Nose: Nose normal.  Mouth/Throat: Oropharynx is clear and moist.  Eyes: Pupils are equal, round, and reactive to light. Conjunctivae and EOM are normal.  Neck: Normal range of motion. Neck supple.  Cardiovascular: Normal rate, regular rhythm, normal heart sounds and intact distal pulses.   Pulmonary/Chest: Effort normal and breath sounds normal.  Abdominal: Soft. Bowel sounds are normal.  Musculoskeletal: Normal range of motion.       Arms: Neurological: She is alert and oriented to person, place, and time.  Skin: Skin is warm.  Psychiatric: She has a normal mood and affect. Her behavior is normal. Judgment and thought content normal.  Nursing note and vitals reviewed.    ED Treatments / Results  Labs (all labs ordered are listed, but only abnormal results are displayed) Labs Reviewed - No data to display  EKG  EKG Interpretation  Date/Time:  Sunday September 19 2016 20:46:41 EDT Ventricular Rate:  59 PR Interval:  108 QRS Duration: 94 QT Interval:  426 QTC Calculation: 421 R Axis:   72 Text Interpretation:  Sinus bradycardia with short PR No significant change since last tracing Confirmed by Virgel Manifold [phone]) on 09/21/2016 6:35:35 AM       Radiology No results found.  Procedures Procedures (including critical care time)  Medications Ordered in ED Medications - No data to display   Initial Impression / Assessment and Plan / ED Course  I have reviewed the triage vital  signs and the nursing notes.  Pertinent labs & imaging results that were available during my care of the patient were reviewed by me and considered in my medical decision making (see chart for details).    CXR neg for fx.  Pt is stable for d/c.  Final Clinical Impressions(s) / ED Diagnoses   Final diagnoses:  Chest wall pain    New Prescriptions Discharge Medication List as of 09/19/2016  9:09 PM       Isla Pence, MD 09/22/16 1314

## 2016-09-19 NOTE — ED Provider Notes (Signed)
Patient care assumed from Velda Shell, M.D. at shift change. Please see her note for further. Plan at shift change is for discharge if chest x-ray is unremarkable. Chest x-ray returned unremarkable. At my evaluation patient reports she it out of her albuterol inhaler. She would like a refill. She denies any shortness of breath or wheezing at this time. Will refill her albuterol inhaler. Encouraged follow-up with wellness center. Return precautions discussed. I advised the patient to follow-up with their primary care provider this week. I advised the patient to return to the emergency department with new or worsening symptoms or new concerns. The patient verbalized understanding and agreement with plan.    Results for orders placed or performed during the hospital encounter of 09/18/16  Wet prep, genital  Result Value Ref Range   Yeast Wet Prep HPF POC NONE SEEN NONE SEEN   Trich, Wet Prep NONE SEEN NONE SEEN   Clue Cells Wet Prep HPF POC NONE SEEN NONE SEEN   WBC, Wet Prep HPF POC MANY (A) NONE SEEN   Sperm NONE SEEN   Lipase, blood  Result Value Ref Range   Lipase 38 11 - 51 U/L  Comprehensive metabolic panel  Result Value Ref Range   Sodium 136 135 - 145 mmol/L   Potassium 3.7 3.5 - 5.1 mmol/L   Chloride 105 101 - 111 mmol/L   CO2 22 22 - 32 mmol/L   Glucose, Bld 95 65 - 99 mg/dL   BUN 10 6 - 20 mg/dL   Creatinine, Ser 0.95 0.44 - 1.00 mg/dL   Calcium 9.1 8.9 - 10.3 mg/dL   Total Protein 6.5 6.5 - 8.1 g/dL   Albumin 4.0 3.5 - 5.0 g/dL   AST 23 15 - 41 U/L   ALT 33 14 - 54 U/L   Alkaline Phosphatase 55 38 - 126 U/L   Total Bilirubin 0.5 0.3 - 1.2 mg/dL   GFR calc non Af Amer >60 >60 mL/min   GFR calc Af Amer >60 >60 mL/min   Anion gap 9 5 - 15  CBC  Result Value Ref Range   WBC 10.3 4.0 - 10.5 K/uL   RBC 4.56 3.87 - 5.11 MIL/uL   Hemoglobin 13.4 12.0 - 15.0 g/dL   HCT 39.7 36.0 - 46.0 %   MCV 87.1 78.0 - 100.0 fL   MCH 29.4 26.0 - 34.0 pg   MCHC 33.8 30.0 - 36.0 g/dL   RDW 12.8 11.5 - 15.5 %   Platelets 303 150 - 400 K/uL  Urinalysis, Routine w reflex microscopic  Result Value Ref Range   Color, Urine YELLOW YELLOW   APPearance HAZY (A) CLEAR   Specific Gravity, Urine 1.029 1.005 - 1.030   pH 5.0 5.0 - 8.0   Glucose, UA NEGATIVE NEGATIVE mg/dL   Hgb urine dipstick NEGATIVE NEGATIVE   Bilirubin Urine NEGATIVE NEGATIVE   Ketones, ur 5 (A) NEGATIVE mg/dL   Protein, ur NEGATIVE NEGATIVE mg/dL   Nitrite NEGATIVE NEGATIVE   Leukocytes, UA MODERATE (A) NEGATIVE   RBC / HPF 0-5 0 - 5 RBC/hpf   WBC, UA 6-30 0 - 5 WBC/hpf   Bacteria, UA NONE SEEN NONE SEEN   Squamous Epithelial / LPF 0-5 (A) NONE SEEN   Mucous PRESENT   I-Stat beta hCG blood, ED  Result Value Ref Range   I-stat hCG, quantitative <5.0 <5 mIU/mL   Comment 3           Dg Chest 2 View  Result  Date: 09/19/2016 CLINICAL DATA:  21 year old female with history of pain in the left side of the rib cage after doing some lifting. EXAM: CHEST  2 VIEW COMPARISON:  Chest x-ray 08/31/2016. FINDINGS: Lung volumes are normal. No consolidative airspace disease. No pleural effusions. No pneumothorax. No pulmonary nodule or mass noted. Pulmonary vasculature and the cardiomediastinal silhouette are within normal limits. 6 field No radiographic evidence of acute cardiopulmonary disease. IMPRESSION: No active cardiopulmonary disease. Electronically Signed   By: Vinnie Langton M.D.   On: 09/19/2016 20:57   Chest wall pain       Sharmaine Base 09/19/16 2111    Isla Pence, MD 09/22/16 1315

## 2016-09-20 LAB — GC/CHLAMYDIA PROBE AMP (~~LOC~~) NOT AT ARMC
CHLAMYDIA, DNA PROBE: NEGATIVE
Neisseria Gonorrhea: NEGATIVE

## 2016-09-20 LAB — URINE CULTURE

## 2016-09-21 ENCOUNTER — Emergency Department (HOSPITAL_COMMUNITY)
Admission: EM | Admit: 2016-09-21 | Discharge: 2016-09-21 | Disposition: A | Discharge status: Home / Self Care | Payer: Medicaid Other | Attending: Emergency Medicine | Admitting: Emergency Medicine

## 2016-09-21 ENCOUNTER — Encounter (HOSPITAL_COMMUNITY): Payer: Self-pay | Admitting: Emergency Medicine

## 2016-09-21 DIAGNOSIS — Z791 Long term (current) use of non-steroidal anti-inflammatories (NSAID): Secondary | ICD-10-CM | POA: Insufficient documentation

## 2016-09-21 DIAGNOSIS — F17210 Nicotine dependence, cigarettes, uncomplicated: Secondary | ICD-10-CM | POA: Insufficient documentation

## 2016-09-21 DIAGNOSIS — M549 Dorsalgia, unspecified: Secondary | ICD-10-CM | POA: Insufficient documentation

## 2016-09-21 DIAGNOSIS — Z79899 Other long term (current) drug therapy: Secondary | ICD-10-CM | POA: Insufficient documentation

## 2016-09-21 DIAGNOSIS — R079 Chest pain, unspecified: Secondary | ICD-10-CM | POA: Insufficient documentation

## 2016-09-21 LAB — BASIC METABOLIC PANEL
ANION GAP: 7 (ref 5–15)
BUN: 11 mg/dL (ref 6–20)
CALCIUM: 9.3 mg/dL (ref 8.9–10.3)
CO2: 24 mmol/L (ref 22–32)
Chloride: 107 mmol/L (ref 101–111)
Creatinine, Ser: 0.91 mg/dL (ref 0.44–1.00)
GFR calc Af Amer: >60 mL/min (ref >60)
Glucose, Bld: 98 mg/dL (ref 65–99)
POTASSIUM: 4.1 mmol/L (ref 3.5–5.1)
SODIUM: 138 mmol/L (ref 135–145)

## 2016-09-21 LAB — CBC
HEMATOCRIT: 39.8 % (ref 36.0–46.0)
HEMOGLOBIN: 13.7 g/dL (ref 12.0–15.0)
MCH: 30.2 pg (ref 26.0–34.0)
MCHC: 34.4 g/dL (ref 30.0–36.0)
MCV: 87.9 fL (ref 78.0–100.0)
Platelets: 276 10*3/uL (ref 150–400)
RBC: 4.53 MIL/uL (ref 3.87–5.11)
RDW: 12.8 % (ref 11.5–15.5)
WBC: 11 10*3/uL — AB (ref 4.0–10.5)

## 2016-09-21 LAB — I-STAT TROPONIN, ED: TROPONIN I, POC: 0 ng/mL (ref 0.00–0.08)

## 2016-09-21 MED ORDER — KETOROLAC TROMETHAMINE 30 MG/ML IJ SOLN
30.0000 mg | Freq: Once | INTRAMUSCULAR | Status: AC
  Administered 2016-09-21: 30 mg via INTRAMUSCULAR
  Filled 2016-09-21: qty 1

## 2016-09-21 NOTE — ED Triage Notes (Signed)
Pt reports central CP starting during the night, upper back pain starting this am.  Pt denies SOB, n/v, recent injury. NAD noted at this time, resp e/u.

## 2016-09-21 NOTE — ED Provider Notes (Signed)
[facility] DEPT Provider Note   CSN: [id number] Arrival date & time: 09/21/16  1246     History   Chief Complaint Chief Complaint  Patient presents with  . Chest Pain  . Back Pain    HPI Beverly Torres is a 21 y.o. female.  HPI Beverly Torres is a 21 y.o. female with hx of acid reflux, anxiety, depression, chronic chest pain, presents to ED with complaint of upper back pain. States pain started this morning. Pain with movement. States no injuries but did clean her room yesterday so may have lifted something or reached or reached over her head. Pain does not radiate into extremities. Pt also complaining of chronic chest pain. States has been there for months. Was seen here for the same in the past. No SOB. No palpitations. No swelling in extremities. Denies pregnancy. No recent travel. Pt took naprosyn 2 hrs ago which did not help.  Pt has had 12 visits to ED in the last 30 days for similar complaints.    Past Medical History:  Diagnosis Date  . Acid reflux   . Anxiety   . Anxiety   . Attention deficit disorder   . Chronic chest pain   . Depression     Patient Active Problem List   Diagnosis Date Noted  . 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 Anxiety disorder 08/21/2016  . Anxiety 05/02/2012  . ADHD (attention deficit hyperactivity disorder), combined type 03/28/2012    Past Surgical History:  Procedure Laterality Date  . ADENOIDECTOMY    . TONSILLECTOMY    . WISDOM TOOTH EXTRACTION      OB History    No data available       Home Medications    Prior to Admission medications   Medication Sig Start Date End Date Taking? Authorizing Provider  albuterol (PROVENTIL HFA;VENTOLIN HFA) 108 (90 Base) MCG/ACT inhaler Inhale 2 puffs into the lungs every 6 (six) hours as needed for wheezing or shortness of breath. 09/19/16   Waynetta Pean, PA-C  ARIPiprazole (ABILIFY) 2 MG tablet Take 1 tablet (2 mg total) by mouth 2 (two) times daily. For mood  control 08/27/16   Lindell Spar I, NP  ARIPiprazole ER 400 MG SRER Inject 400 mg into the muscle once. (Due 09-25-16): For mood control 09/25/17 09/25/17  Lindell Spar I, NP  famotidine (PEPCID) 20 MG tablet Take 1 tablet (20 mg total) by mouth 2 (two) times daily. 08/31/16   Forrest Moron, MD  gabapentin (NEURONTIN) 100 MG capsule Take 1 capsule (100 mg total) by mouth 2 (two) times daily. For agitation 08/27/16   Lindell Spar I, NP  hydrOXYzine (ATARAX/VISTARIL) 50 MG tablet Take 1 tablet (50 mg total) by mouth 3 (three) times daily as needed for anxiety. 08/27/16   Lindell Spar I, NP  ibuprofen (ADVIL,MOTRIN) 600 MG tablet Take 1 tablet (600 mg total) by mouth every 6 (six) hours as needed. 09/18/16   Shary Decamp, PA-C  LORazepam (ATIVAN) 1 MG tablet Take 1 tablet (1 mg total) by mouth every 8 (eight) hours as needed for anxiety. 08/27/16   Lindell Spar I, NP  mupirocin cream (BACTROBAN) 2 % Apply topically 2 (two) times daily. For wound care 08/27/16   Lindell Spar I, NP  naproxen (NAPROSYN) 500 MG tablet Take 1 tablet (500 mg total) by mouth 2 (two) times daily. 09/17/16   Tanetta Fuhriman, Lahoma Rocker, PA-C  nitrofurantoin, macrocrystal-monohydrate, (MACROBID) 100 MG capsule Take 1 capsule (100 mg total) by mouth  2 (two) times daily. 09/18/16   Shary Decamp, PA-C  sertraline (ZOLOFT) 100 MG tablet Take 1 tablet (100 mg total) by mouth daily. For depression 08/28/16   Lindell Spar I, NP  traZODone (DESYREL) 50 MG tablet Take 1 tablet (50 mg total) by mouth at bedtime as needed for sleep. 08/27/16   Encarnacion Slates, NP    Family History Family History  Problem Relation Age of Onset  . Anxiety disorder Mother   . Drug abuse Father   . ADD / ADHD Father   . ADD / ADHD Sister   . Anxiety disorder Sister   . ADD / ADHD Brother   . ODD Brother   . Rheum arthritis Maternal Grandmother   . Kidney failure Maternal Grandmother   . Hypertension Maternal Grandfather   . Diabetes Maternal Grandfather   . Arthritis  Maternal Grandfather   . Cancer Maternal Grandfather        Melanoma  . Diabetes Paternal Grandfather     Social History Social History  Substance Use Topics  . Smoking status: Current Every Day Smoker    Packs/day: 0.50    Types: Cigarettes  . Smokeless tobacco: Never Used  . Alcohol use No     Allergies   Strawberry (diagnostic)   Review of Systems Review of Systems  Constitutional: Negative for chills and fever.  Respiratory: Positive for chest tightness. Negative for cough and shortness of breath.   Cardiovascular: Positive for chest pain. Negative for palpitations and leg swelling.  Gastrointestinal: Negative for abdominal pain, diarrhea, nausea and vomiting.  Genitourinary: Negative for dysuria, flank pain, pelvic pain, vaginal bleeding, vaginal discharge and vaginal pain.  Musculoskeletal: Positive for arthralgias and back pain. Negative for myalgias, neck pain and neck stiffness.  Skin: Negative for rash.  Neurological: Negative for dizziness, weakness and headaches.  All other systems reviewed and are negative.    Physical Exam Updated Vital Signs BP 103/65   Pulse 63   Temp 98 F (36.7 C) (Oral)   Resp 18   LMP 09/19/2016 (Exact Date)   SpO2 100%   Physical Exam  Constitutional: She appears well-developed and well-nourished. No distress.  HENT:  Head: Normocephalic.  Eyes: Conjunctivae are normal.  Neck: Neck supple.  Cardiovascular: Normal rate, regular rhythm and normal heart sounds.   Pulmonary/Chest: Effort normal and breath sounds normal. No respiratory distress. She has no wheezes. She has no rales.  Abdominal: Soft. Bowel sounds are normal. She exhibits no distension. There is no tenderness. There is no rebound.  Musculoskeletal: She exhibits no edema.  ttp over paraspinal muscles of thoracic spine. Pain bilateral with tenderness over periscapular area. Distal radial pulses intact and equal bilaterally.   Neurological: She is alert.  5/5 and  equal bilateral bicep, tricep, grip strength  Skin: Skin is warm and dry.  Psychiatric: She has a normal mood and affect. Her behavior is normal.  Nursing note and vitals reviewed.    ED Treatments / Results  Labs (all labs ordered are listed, but only abnormal results are displayed) Labs Reviewed  CBC - Abnormal; Notable for the following:       Result Value   WBC 11.0 (*)    All other components within normal limits  BASIC METABOLIC PANEL  I-STAT TROPONIN, ED    EKG  EKG Interpretation  Date/Time:  Tuesday September 21 2016 16:11:42 EDT Ventricular Rate:  59 PR Interval:  108 QRS Duration: 88 QT Interval:  416 QTC Calculation: 411 R  Axis:   75 Text Interpretation:  Sinus bradycardia with short PR Otherwise normal ECG When compared to prior, no significant changes.  No STEMI Confirmed by Antony Blackbird [phone]) on 09/21/2016 4:17:43 PM       Radiology Dg Chest 2 View  Result Date: 09/19/2016 CLINICAL DATA:  21 year old female with history of pain in the left side of the rib cage after doing some lifting. EXAM: CHEST  2 VIEW COMPARISON:  Chest x-ray 08/31/2016. FINDINGS: Lung volumes are normal. No consolidative airspace disease. No pleural effusions. No pneumothorax. No pulmonary nodule or mass noted. Pulmonary vasculature and the cardiomediastinal silhouette are within normal limits. 6 field No radiographic evidence of acute cardiopulmonary disease. IMPRESSION: No active cardiopulmonary disease. Electronically Signed   By: Vinnie Langton M.D.   On: 09/19/2016 20:57    Procedures Procedures (including critical care time)  Medications Ordered in ED Medications  ketorolac (TORADOL) 30 MG/ML injection 30 mg (not administered)     Initial Impression / Assessment and Plan / ED Course  I have reviewed the triage vital signs and the nursing notes.  Pertinent labs & imaging results that were available during my care of the patient were reviewed by me and considered in my  medical decision making (see chart for details).     Pt seen and examined. Pt with back pain and chronic chest pain. ECG with no significant findings to explain her pain. No injuries although she was more active than usual yesterday cleaning her room. Afebrile. Normal VS. Has had 12 visits in last 30 days to ED. Pt has had 10 chest xrays in the last 3 months, all negative. Most likely musculoskeletal pain. Will treat with NSAIDs, stretching, massage. Follow up with pcp. Labs including trop negative today.   Vitals:   09/21/16 1325 09/21/16 1542  BP: 106/66 103/65  Pulse: 81 63  Resp: 18 18  Temp: 98 F (36.7 C)   SpO2: 100% 100%     Final Clinical Impressions(s) / ED Diagnoses   Final diagnoses:  Musculoskeletal back pain    New Prescriptions Discharge Medication List as of 09/21/2016  4:57 PM       Jeannett Senior, PA-C 09/21/16 2313    Tegeler, Gwenyth Allegra, MD 09/22/16 [phone]

## 2016-09-21 NOTE — Discharge Instructions (Signed)
Try stretches, massage, rest. Continue naprosyn for pain. Follow up as needed with family doctor.

## 2016-10-15 ENCOUNTER — Emergency Department (HOSPITAL_COMMUNITY)
Admission: EM | Admit: 2016-10-15 | Discharge: 2016-10-15 | Disposition: A | Discharge status: Home / Self Care | Payer: No Typology Code available for payment source | Attending: Emergency Medicine | Admitting: Emergency Medicine

## 2016-10-15 ENCOUNTER — Encounter (HOSPITAL_COMMUNITY): Payer: Self-pay

## 2016-10-15 DIAGNOSIS — R079 Chest pain, unspecified: Secondary | ICD-10-CM | POA: Insufficient documentation

## 2016-10-15 DIAGNOSIS — Z5321 Procedure and treatment not carried out due to patient leaving prior to being seen by health care provider: Secondary | ICD-10-CM | POA: Insufficient documentation

## 2016-10-15 NOTE — ED Notes (Signed)
No answer in waiting room 

## 2016-10-15 NOTE — ED Notes (Signed)
Called pt x 2 for vitals recheck. No answer

## 2016-10-15 NOTE — ED Triage Notes (Signed)
Pt endorses chest tightness and shob that began earlier today. Pt has had multiple similar episodes in the past. VSS

## 2016-10-31 ENCOUNTER — Encounter (HOSPITAL_COMMUNITY): Payer: Self-pay

## 2016-10-31 ENCOUNTER — Emergency Department (HOSPITAL_COMMUNITY)
Admission: EM | Admit: 2016-10-31 | Discharge: 2016-10-31 | Disposition: A | Discharge status: Home / Self Care | Payer: Self-pay | Attending: Emergency Medicine | Admitting: Emergency Medicine

## 2016-10-31 DIAGNOSIS — R0981 Nasal congestion: Secondary | ICD-10-CM | POA: Insufficient documentation

## 2016-10-31 DIAGNOSIS — Z79899 Other long term (current) drug therapy: Secondary | ICD-10-CM | POA: Insufficient documentation

## 2016-10-31 DIAGNOSIS — F17210 Nicotine dependence, cigarettes, uncomplicated: Secondary | ICD-10-CM | POA: Insufficient documentation

## 2016-10-31 DIAGNOSIS — J0110 Acute frontal sinusitis, unspecified: Secondary | ICD-10-CM | POA: Insufficient documentation

## 2016-10-31 DIAGNOSIS — R05 Cough: Secondary | ICD-10-CM | POA: Insufficient documentation

## 2016-10-31 MED ORDER — AMOXICILLIN-POT CLAVULANATE 875-125 MG PO TABS: 1.0000 | ORAL_TABLET | Freq: Two times a day (BID) | ORAL | 0 refills | Status: AC

## 2016-10-31 NOTE — ED Notes (Signed)
Pt verbalizes understanding of D/C papers, medications, and follow up appointments.

## 2016-10-31 NOTE — Discharge Instructions (Signed)
Take tylenol 2 pills 4 times a day and motrin 4 pills 3 times a day.  Drink plenty of fluids.  Return for worsening shortness of breath, headache, confusion. Follow up with your family doctor.   

## 2016-10-31 NOTE — ED Triage Notes (Signed)
Pt presents for evaluation of headache and nausea x 1 day. Reports unable to relieve headache at home. Pt ambulatory, AxO x4.

## 2016-10-31 NOTE — ED Provider Notes (Signed)
[facility] DEPT Provider Note   CSN: [id number] Arrival date & time: 10/31/16  1232     History   Chief Complaint Chief Complaint  Patient presents with  . Headache    HPI Beverly Torres is a 21 y.o. female.  21 yo F with a chief complaint of a headache. The patient has had some cough and congestion for the past couple days. She had a subjective fever yesterday. Pain worse to the left frontal region. Worse with palpation loud noises. Denies history of prior headaches. Denies trauma. Having some left ear pain and left-sided sore throat as well. Denies sick contacts. Describes it as a dull throbbing pain. Denies radiation. Denies shortness of breath.   The history is provided by the patient.  Headache   Associated symptoms include a fever (subjective). Pertinent negatives include no palpitations, no shortness of breath, no nausea and no vomiting.  Illness  This is a new problem. The current episode started 2 days ago. The problem occurs constantly. The problem has been gradually worsening. Pertinent negatives include no chest pain, no headaches and no shortness of breath. Nothing aggravates the symptoms. Nothing relieves the symptoms. She has tried nothing for the symptoms. The treatment provided no relief.    Past Medical History:  Diagnosis Date  . Acid reflux   . Anxiety   . Anxiety   . Attention deficit disorder   . Chronic chest pain   . Depression     Patient Active Problem List   Diagnosis Date Noted  . 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 Anxiety disorder 08/21/2016  . Anxiety 05/02/2012  . ADHD (attention deficit hyperactivity disorder), combined type 03/28/2012    Past Surgical History:  Procedure Laterality Date  . ADENOIDECTOMY    . TONSILLECTOMY    . WISDOM TOOTH EXTRACTION      OB History    No data available       Home Medications    Prior to Admission medications   Medication Sig Start Date End Date Taking? Authorizing  Provider  albuterol (PROVENTIL HFA;VENTOLIN HFA) 108 (90 Base) MCG/ACT inhaler Inhale 2 puffs into the lungs every 6 (six) hours as needed for wheezing or shortness of breath. 09/19/16   Waynetta Pean, PA-C  amoxicillin-clavulanate (AUGMENTIN) 875-125 MG tablet Take 1 tablet by mouth 2 (two) times daily. 10/31/16 11/10/16  Deno Etienne, DO  ARIPiprazole (ABILIFY) 2 MG tablet Take 1 tablet (2 mg total) by mouth 2 (two) times daily. For mood control 08/27/16   Lindell Spar I, NP  ARIPiprazole ER 400 MG SRER Inject 400 mg into the muscle once. (Due 09-25-16): For mood control 09/25/17 09/25/17  Lindell Spar I, NP  famotidine (PEPCID) 20 MG tablet Take 1 tablet (20 mg total) by mouth 2 (two) times daily. 08/31/16   Forrest Moron, MD  gabapentin (NEURONTIN) 100 MG capsule Take 1 capsule (100 mg total) by mouth 2 (two) times daily. For agitation 08/27/16   Lindell Spar I, NP  hydrOXYzine (ATARAX/VISTARIL) 50 MG tablet Take 1 tablet (50 mg total) by mouth 3 (three) times daily as needed for anxiety. 08/27/16   Lindell Spar I, NP  ibuprofen (ADVIL,MOTRIN) 600 MG tablet Take 1 tablet (600 mg total) by mouth every 6 (six) hours as needed. 09/18/16   Shary Decamp, PA-C  LORazepam (ATIVAN) 1 MG tablet Take 1 tablet (1 mg total) by mouth every 8 (eight) hours as needed for anxiety. 08/27/16   Lindell Spar I, NP  mupirocin cream (  BACTROBAN) 2 % Apply topically 2 (two) times daily. For wound care 08/27/16   Lindell Spar I, NP  naproxen (NAPROSYN) 500 MG tablet Take 1 tablet (500 mg total) by mouth 2 (two) times daily. 09/17/16   Kirichenko, Lahoma Rocker, PA-C  nitrofurantoin, macrocrystal-monohydrate, (MACROBID) 100 MG capsule Take 1 capsule (100 mg total) by mouth 2 (two) times daily. 09/18/16   Shary Decamp, PA-C  sertraline (ZOLOFT) 100 MG tablet Take 1 tablet (100 mg total) by mouth daily. For depression 08/28/16   Lindell Spar I, NP  traZODone (DESYREL) 50 MG tablet Take 1 tablet (50 mg total) by mouth at bedtime as needed for  sleep. 08/27/16   Encarnacion Slates, NP    Family History Family History  Problem Relation Age of Onset  . Anxiety disorder Mother   . Drug abuse Father   . ADD / ADHD Father   . ADD / ADHD Sister   . Anxiety disorder Sister   . ADD / ADHD Brother   . ODD Brother   . Rheum arthritis Maternal Grandmother   . Kidney failure Maternal Grandmother   . Hypertension Maternal Grandfather   . Diabetes Maternal Grandfather   . Arthritis Maternal Grandfather   . Cancer Maternal Grandfather        Melanoma  . Diabetes Paternal Grandfather     Social History Social History  Substance Use Topics  . Smoking status: Current Every Day Smoker    Packs/day: 0.50    Types: Cigarettes  . Smokeless tobacco: Never Used  . Alcohol use No     Allergies   Strawberry (diagnostic)   Review of Systems Review of Systems  Constitutional: Positive for fever (subjective). Negative for chills.  HENT: Positive for congestion and rhinorrhea.   Eyes: Negative for redness and visual disturbance.  Respiratory: Positive for cough. Negative for shortness of breath and wheezing.   Cardiovascular: Negative for chest pain and palpitations.  Gastrointestinal: Negative for nausea and vomiting.  Genitourinary: Negative for dysuria and urgency.  Musculoskeletal: Negative for arthralgias and myalgias.  Skin: Negative for pallor and wound.  Neurological: Negative for dizziness and headaches.     Physical Exam Updated Vital Signs BP 123/60 (BP Location: Left Arm)   Pulse 95   Temp 98.5 F (36.9 C) (Oral)   Resp 18   LMP 10/15/2016 (Exact Date)   SpO2 100%   Physical Exam  Constitutional: She is oriented to person, place, and time. She appears well-developed and well-nourished. No distress.  HENT:  Head: Normocephalic and atraumatic.  Swollen turbinates, posterior nasal drip, exquisite tenderness to percussion of the left frontal sinus. Patient with erythema and bulging to the left TM with a purulent  effusion.  Eyes: Pupils are equal, round, and reactive to light. EOM are normal.  Neck: Normal range of motion. Neck supple.  Cardiovascular: Normal rate and regular rhythm.  Exam reveals no gallop and no friction rub.   No murmur heard. Pulmonary/Chest: Effort normal. She has no wheezes. She has no rales.  Abdominal: Soft. She exhibits no distension and no mass. There is no tenderness. There is no guarding.  Musculoskeletal: She exhibits no edema or tenderness.  Neurological: She is alert and oriented to person, place, and time. She has normal strength. No cranial nerve deficit or sensory deficit. She displays a negative Romberg sign. Coordination and gait normal. GCS eye subscore is 4. GCS verbal subscore is 5. GCS motor subscore is 6. She displays no Babinski's sign on the right side.  She displays no Babinski's sign on the left side.  Benign neuro exam  Skin: Skin is warm and dry. She is not diaphoretic.  Psychiatric: She has a normal mood and affect. Her behavior is normal.  Nursing note and vitals reviewed.    ED Treatments / Results  Labs (all labs ordered are listed, but only abnormal results are displayed) Labs Reviewed - No data to display  EKG  EKG Interpretation None       Radiology No results found.  Procedures Procedures (including critical care time)  Medications Ordered in ED Medications - No data to display   Initial Impression / Assessment and Plan / ED Course  I have reviewed the triage vital signs and the nursing notes.  Pertinent labs & imaging results that were available during my care of the patient were reviewed by me and considered in my medical decision making (see chart for details).     21 yo F with URI-like symptoms. Clinically has sinusitis on exam as well as otitis media. Will treat with antibiotics. Discussed with patient about smoking cessation.  Discussed smoking cessation with patient and was they were offerred resources to help stop.   Total time was 5 min CPT code 99406.    2:47 PM:  I have discussed the diagnosis/risks/treatment options with the patient and believe the pt to be eligible for discharge home to follow-up with PCP. We also discussed returning to the ED immediately if new or worsening sx occur. We discussed the sx which are most concerning (e.g., sudden worsening pain, fever, inability to tolerate by mouth) that necessitate immediate return. Medications administered to the patient during their visit and any new prescriptions provided to the patient are listed below.  Medications given during this visit Medications - No data to display   The patient appears reasonably screen and/or stabilized for discharge and I doubt any other medical condition or other [facility] requiring further screening, evaluation, or treatment in the ED at this time prior to discharge.    Final Clinical Impressions(s) / ED Diagnoses   Final diagnoses:  Acute frontal sinusitis, recurrence not specified    New Prescriptions New Prescriptions   AMOXICILLIN-CLAVULANATE (AUGMENTIN) 875-125 MG TABLET    Take 1 tablet by mouth 2 (two) times daily.     Deno Etienne, DO 10/31/16 1447

## 2016-11-01 ENCOUNTER — Emergency Department (HOSPITAL_COMMUNITY): Payer: Self-pay

## 2016-11-01 ENCOUNTER — Encounter (HOSPITAL_COMMUNITY): Payer: Self-pay | Admitting: Family Medicine

## 2016-11-01 ENCOUNTER — Emergency Department (HOSPITAL_COMMUNITY)
Admission: EM | Admit: 2016-11-01 | Discharge: 2016-11-01 | Disposition: A | Discharge status: Home / Self Care | Payer: Self-pay | Attending: Emergency Medicine | Admitting: Emergency Medicine

## 2016-11-01 DIAGNOSIS — R079 Chest pain, unspecified: Secondary | ICD-10-CM | POA: Insufficient documentation

## 2016-11-01 DIAGNOSIS — Z5321 Procedure and treatment not carried out due to patient leaving prior to being seen by health care provider: Secondary | ICD-10-CM | POA: Insufficient documentation

## 2016-11-01 LAB — BASIC METABOLIC PANEL
Anion gap: 7 (ref 5–15)
BUN: 15 mg/dL (ref 6–20)
CALCIUM: 9.3 mg/dL (ref 8.9–10.3)
CHLORIDE: 107 mmol/L (ref 101–111)
CO2: 24 mmol/L (ref 22–32)
CREATININE: 0.89 mg/dL (ref 0.44–1.00)
GFR calc Af Amer: >60 mL/min (ref >60)
GLUCOSE: 99 mg/dL (ref 65–99)
Potassium: 3.9 mmol/L (ref 3.5–5.1)
SODIUM: 138 mmol/L (ref 135–145)

## 2016-11-01 LAB — CBC
HCT: 41.4 % (ref 36.0–46.0)
Hemoglobin: 14.1 g/dL (ref 12.0–15.0)
MCH: 30.1 pg (ref 26.0–34.0)
MCHC: 34.1 g/dL (ref 30.0–36.0)
MCV: 88.5 fL (ref 78.0–100.0)
PLATELETS: 356 10*3/uL (ref 150–400)
RBC: 4.68 MIL/uL (ref 3.87–5.11)
RDW: 13.2 % (ref 11.5–15.5)
WBC: 11.5 10*3/uL — AB (ref 4.0–10.5)

## 2016-11-01 LAB — I-STAT TROPONIN, ED: TROPONIN I, POC: 0 ng/mL (ref 0.00–0.08)

## 2016-11-01 LAB — POCT PREGNANCY, URINE: PREG TEST UR: NEGATIVE

## 2016-11-01 NOTE — ED Notes (Signed)
Pt didn't answer when called to a room.

## 2016-11-01 NOTE — ED Triage Notes (Signed)
Patient is complaining of left sided chest pain that radiates to the mid stereum. Patient describes the pain as pressure with shortness of breath. Also, requesting a pregnancy test to make sure she is not pregnant. Patient appears in no acute distress. Respirations are even, regular, and unlabored. Patient intermittently smiles during triage.

## 2016-11-01 NOTE — ED Notes (Signed)
No answer when recalled for vitals

## 2016-11-04 ENCOUNTER — Emergency Department (HOSPITAL_COMMUNITY)
Admission: EM | Admit: 2016-11-04 | Discharge: 2016-11-05 | Disposition: A | Discharge status: Home / Self Care | Payer: Self-pay | Attending: Emergency Medicine | Admitting: Emergency Medicine

## 2016-11-04 ENCOUNTER — Encounter (HOSPITAL_COMMUNITY): Payer: Self-pay | Admitting: Emergency Medicine

## 2016-11-04 DIAGNOSIS — Z5321 Procedure and treatment not carried out due to patient leaving prior to being seen by health care provider: Secondary | ICD-10-CM | POA: Insufficient documentation

## 2016-11-04 DIAGNOSIS — R079 Chest pain, unspecified: Secondary | ICD-10-CM | POA: Insufficient documentation

## 2016-11-04 NOTE — ED Triage Notes (Signed)
BIB EMS, pt reports anxiety and CP, pt seen here 31X in past 6 mo for same.Marland KitchenMarland Kitchen

## 2016-11-04 NOTE — ED Notes (Signed)
Called pt, no response. 

## 2016-11-04 NOTE — ED Provider Notes (Signed)
Patient not in room.   Etta Quill, NP 11/04/16 2325    Ward, Delice Bison, DO 11/05/16 Ofilia Neas

## 2016-11-05 NOTE — ED Notes (Signed)
Pt did not respond to name

## 2016-12-06 ENCOUNTER — Encounter (HOSPITAL_COMMUNITY): Payer: Self-pay | Admitting: *Deleted

## 2016-12-06 DIAGNOSIS — R1010 Upper abdominal pain, unspecified: Secondary | ICD-10-CM | POA: Insufficient documentation

## 2016-12-06 LAB — CBC
HEMATOCRIT: 39.5 % (ref 36.0–46.0)
HEMOGLOBIN: 13.2 g/dL (ref 12.0–15.0)
MCH: 30.1 pg (ref 26.0–34.0)
MCHC: 33.4 g/dL (ref 30.0–36.0)
MCV: 90.2 fL (ref 78.0–100.0)
Platelets: 335 10*3/uL (ref 150–400)
RBC: 4.38 MIL/uL (ref 3.87–5.11)
RDW: 13.1 % (ref 11.5–15.5)
WBC: 12 10*3/uL — AB (ref 4.0–10.5)

## 2016-12-06 LAB — COMPREHENSIVE METABOLIC PANEL
ALT: 25 U/L (ref 14–54)
ANION GAP: 7 (ref 5–15)
AST: 19 U/L (ref 15–41)
Albumin: 3.9 g/dL (ref 3.5–5.0)
Alkaline Phosphatase: 58 U/L (ref 38–126)
BILIRUBIN TOTAL: 0.2 mg/dL — AB (ref 0.3–1.2)
BUN: 12 mg/dL (ref 6–20)
CHLORIDE: 107 mmol/L (ref 101–111)
CO2: 25 mmol/L (ref 22–32)
Calcium: 9.4 mg/dL (ref 8.9–10.3)
Creatinine, Ser: 0.93 mg/dL (ref 0.44–1.00)
Glucose, Bld: 90 mg/dL (ref 65–99)
Potassium: 3.8 mmol/L (ref 3.5–5.1)
Sodium: 139 mmol/L (ref 135–145)
TOTAL PROTEIN: 6.7 g/dL (ref 6.5–8.1)

## 2016-12-06 LAB — URINALYSIS, ROUTINE W REFLEX MICROSCOPIC
Bilirubin Urine: NEGATIVE
Glucose, UA: NEGATIVE mg/dL
Hgb urine dipstick: NEGATIVE
KETONES UR: NEGATIVE mg/dL
LEUKOCYTES UA: NEGATIVE
NITRITE: NEGATIVE
PH: 6 (ref 5.0–8.0)
Protein, ur: NEGATIVE mg/dL
Specific Gravity, Urine: 1.021 (ref 1.005–1.030)

## 2016-12-06 LAB — LIPASE, BLOOD: LIPASE: 28 U/L (ref 11–51)

## 2016-12-06 NOTE — ED Triage Notes (Signed)
Pt would like to be evaluated for upper abdominal pain since yesterday.  Pt denies any GU or GYN symptoms with this, no constipation wit this.  Pt denies any n/v and does not presently appear in distress.

## 2016-12-07 ENCOUNTER — Emergency Department (HOSPITAL_COMMUNITY)
Admission: EM | Admit: 2016-12-07 | Discharge: 2016-12-07 | Disposition: A | Discharge status: Home / Self Care | Payer: Self-pay | Attending: Emergency Medicine | Admitting: Emergency Medicine

## 2017-02-08 NOTE — L&D Delivery Note (Addendum)
Delivery Note Patient is a 22 y.o. now G1P100 s/p NSVD at [redacted]w[redacted]d, who was admitted for SOL. She progressed with AROM and Pitocin augmentation to complete and pushed 54min to deliver. Dr. Glo Torres in room as a precaution for possible shoulder dystocia. At 5:02 PM a viable female was delivered via Vaginal, Spontaneous (Presentation: LOA) without complication.  APGAR: 8, 9; weight 6 lb 13.7 oz (3110 g).  Cord clamping delayed by several minutes then clamped by SNM and cut by FOB.  Placenta intact and spontaneous, bleeding minimal. 3-vessel intact cord without complication. 1st degree vaginal wall laceration repaired without difficulty.  Mom and baby stable prior to transfer to postpartum. She plans on breast and bottlefeeding. She requests POPs for birth control.  Anesthesia: Epidural Episiotomy: None Lacerations: Vaginal;1st degree Suture Repair: vicryl Est. Blood Loss (mL): 307  Mom to postpartum.  Baby to Couplet care / Skin to Skin.  Beverly Torres, SNM 01/04/2018, 6:22 PM  I confirm that I have verified the information documented in the SNM's note and that I have also personally reperformed the physical exam and all medical decision making activities.  I was gloved and present for entire delivery SVD without incident No difficulty with shoulders Lacerations as listed above Repair of same supervised by Dr Beverly Torres Beverly Torres, CNM

## 2017-04-30 ENCOUNTER — Emergency Department (HOSPITAL_COMMUNITY)
Admission: EM | Admit: 2017-04-30 | Discharge: 2017-04-30 | Disposition: A | Discharge status: Home / Self Care | Payer: Medicaid Other | Attending: Emergency Medicine | Admitting: Emergency Medicine

## 2017-04-30 ENCOUNTER — Other Ambulatory Visit: Payer: Self-pay

## 2017-04-30 DIAGNOSIS — M545 Low back pain, unspecified: Secondary | ICD-10-CM

## 2017-04-30 DIAGNOSIS — F17210 Nicotine dependence, cigarettes, uncomplicated: Secondary | ICD-10-CM | POA: Insufficient documentation

## 2017-04-30 DIAGNOSIS — Z79899 Other long term (current) drug therapy: Secondary | ICD-10-CM | POA: Insufficient documentation

## 2017-04-30 MED ORDER — CYCLOBENZAPRINE HCL 5 MG PO TABS: 5.0000 mg | ORAL_TABLET | Freq: Every evening | ORAL | 0 refills | Status: DC | PRN

## 2017-04-30 MED ORDER — LIDOCAINE 5 % EX PTCH: 1.0000 | MEDICATED_PATCH | CUTANEOUS | 0 refills | Status: DC

## 2017-04-30 NOTE — Discharge Instructions (Addendum)
Continue taking ibuprofen 3 times a day with meals.  Do not take other anti-inflammatories at the same time.  You may supplement with Tylenol if you need further pain control. Use Flexeril as needed for muscle stiffness or spasming.  Have caution, as this may make you tired or groggy.  Do not drive or operate heavy machinery while taking this medicine. Use lidocaine patches as needed for continued pain. Use heating pads to help relax the muscles. Try not to stay in a seated position. Follow-up with your primary care doctor in 1 week if your symptoms are not improving. Return to the emergency room if you develop fevers, loss of bowel or bladder control, numbness, or any new or concerning symptoms.

## 2017-04-30 NOTE — ED Triage Notes (Signed)
Pt reports generalized lower back pain X2 days.  No apparent injury, family reports bruise?  Reported that her back "bulges out" and "spasming."  OTC medication not working

## 2017-04-30 NOTE — ED Provider Notes (Signed)
[facility] EMERGENCY DEPARTMENT Provider Note   CSN: [id number] Arrival date & time: 04/30/17  2134     History   Chief Complaint Chief Complaint  Patient presents with  . Back Pain    HPI Beverly Torres is a 22 y.o. female presenting for evaluation of back pain.  Patient states she has had low back pain for the past 2-3 days.  This began when she woke up several days ago and gradually worsened.  She denies change in activity or strenuous activity.  She denies fall, trauma, or injury.  She denies history of back pain.  She states it feels like a spasm that catches her and will not let go.  She has tried multiple over-the-counter medications including icy hot, ibuprofen, Tylenol, and hemp oil.  Nothing has made it better.  Walking and movement makes the pain worse.  She denies fevers, chills, numbness, tingling, loss of bowel or bladder control, h/o CA, or h/o IVDU.  She denies chest pain, shortness breath, nausea, vomiting, abdominal pain, urinary symptoms or abnormal bowel mvoements.  She denies radiation of the pain down her legs.  No change in medications recently.  HPI  Past Medical History:  Diagnosis Date  . Acid reflux   . Anxiety   . Anxiety   . Attention deficit disorder   . Chronic chest pain   . Depression     Patient Active Problem List   Diagnosis Date Noted  . 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 Anxiety disorder 08/21/2016  . Anxiety 05/02/2012  . ADHD (attention deficit hyperactivity disorder), combined type 03/28/2012    Past Surgical History:  Procedure Laterality Date  . ADENOIDECTOMY    . TONSILLECTOMY    . WISDOM TOOTH EXTRACTION       OB History   None      Home Medications    Prior to Admission medications   Medication Sig Start Date End Date Taking? Authorizing Provider  albuterol (PROVENTIL HFA;VENTOLIN HFA) 108 (90 Base) MCG/ACT inhaler Inhale 2 puffs into the lungs every 6 (six) hours as needed  for wheezing or shortness of breath. 09/19/16   Waynetta Pean, PA-C  ARIPiprazole (ABILIFY) 2 MG tablet Take 1 tablet (2 mg total) by mouth 2 (two) times daily. For mood control 08/27/16   Lindell Spar I, NP  ARIPiprazole ER 400 MG SRER Inject 400 mg into the muscle once. (Due 09-25-16): For mood control 09/25/17 09/25/17  Lindell Spar I, NP  cyclobenzaprine (FLEXERIL) 5 MG tablet Take 1 tablet (5 mg total) by mouth at bedtime as needed for muscle spasms. 04/30/17   Hiroyuki Ozanich, PA-C  famotidine (PEPCID) 20 MG tablet Take 1 tablet (20 mg total) by mouth 2 (two) times daily. 08/31/16   Forrest Moron, MD  gabapentin (NEURONTIN) 100 MG capsule Take 1 capsule (100 mg total) by mouth 2 (two) times daily. For agitation 08/27/16   Lindell Spar I, NP  hydrOXYzine (ATARAX/VISTARIL) 50 MG tablet Take 1 tablet (50 mg total) by mouth 3 (three) times daily as needed for anxiety. 08/27/16   Lindell Spar I, NP  ibuprofen (ADVIL,MOTRIN) 600 MG tablet Take 1 tablet (600 mg total) by mouth every 6 (six) hours as needed. 09/18/16   Shary Decamp, PA-C  lidocaine (LIDODERM) 5 % Place 1 patch onto the skin daily. Remove & Discard patch within 12 hours or as directed by MD 04/30/17   Tayvin Preslar, PA-C  LORazepam (ATIVAN) 1 MG tablet Take 1 tablet (  1 mg total) by mouth every 8 (eight) hours as needed for anxiety. 08/27/16   Lindell Spar I, NP  mupirocin cream (BACTROBAN) 2 % Apply topically 2 (two) times daily. For wound care 08/27/16   Lindell Spar I, NP  naproxen (NAPROSYN) 500 MG tablet Take 1 tablet (500 mg total) by mouth 2 (two) times daily. 09/17/16   Kirichenko, Lahoma Rocker, PA-C  nitrofurantoin, macrocrystal-monohydrate, (MACROBID) 100 MG capsule Take 1 capsule (100 mg total) by mouth 2 (two) times daily. 09/18/16   Shary Decamp, PA-C  sertraline (ZOLOFT) 100 MG tablet Take 1 tablet (100 mg total) by mouth daily. For depression 08/28/16   Lindell Spar I, NP  traZODone (DESYREL) 50 MG tablet Take 1 tablet (50 mg total) by  mouth at bedtime as needed for sleep. 08/27/16   Encarnacion Slates, NP    Family History Family History  Problem Relation Age of Onset  . Anxiety disorder Mother   . Drug abuse Father   . ADD / ADHD Father   . ADD / ADHD Sister   . Anxiety disorder Sister   . ADD / ADHD Brother   . ODD Brother   . Rheum arthritis Maternal Grandmother   . Kidney failure Maternal Grandmother   . Hypertension Maternal Grandfather   . Diabetes Maternal Grandfather   . Arthritis Maternal Grandfather   . Cancer Maternal Grandfather        Melanoma  . Diabetes Paternal Grandfather     Social History Social History   Tobacco Use  . Smoking status: Current Every Day Smoker    Packs/day: 0.50    Types: Cigarettes  . Smokeless tobacco: Never Used  Substance Use Topics  . Alcohol use: No  . Drug use: No     Allergies   Strawberry (diagnostic)   Review of Systems Review of Systems  Constitutional: Negative for chills and fever.  Musculoskeletal: Positive for back pain.  Skin: Negative for wound.  Neurological: Negative for numbness.  Hematological: Does not bruise/bleed easily.     Physical Exam Updated Vital Signs BP 128/68 (BP Location: Right Arm)   Pulse 72   Temp 98.4 F (36.9 C) (Oral)   Resp 16   Ht 5\' 2"  (1.575 m)   Wt 96.2 kg (212 lb)   SpO2 100%   BMI 38.78 kg/m   Physical Exam  Constitutional: She is oriented to person, place, and time. She appears well-developed and well-nourished. No distress.  Pt appears uncomfortable due to pain, in NAD  HENT:  Head: Normocephalic and atraumatic.  Eyes: Pupils are equal, round, and reactive to light. Conjunctivae and EOM are normal.  Neck: Normal range of motion.  Cardiovascular: Normal rate, regular rhythm and intact distal pulses.  Pulmonary/Chest: Effort normal and breath sounds normal. No respiratory distress. She has no wheezes.  Abdominal: Soft. She exhibits no distension. There is no tenderness. There is no guarding.    Musculoskeletal: Normal range of motion.  Tenderness to palpation of lumbar and sacral back.  Tenderness to palpation midline and paraspinal muscles.  Palpable spasming of paraspinal muscles bilaterally.  No tenderness palpation of upper back.  Patient is ambulatory with pain.  Drink intact bilaterally.  Sensation intact bilaterally.  Pedal pulses equal bilaterally.  Soft compartments.  Neurological: She is alert and oriented to person, place, and time. She displays normal reflexes. No sensory deficit.  Patellar reflexes intact bilaterally.  Skin: Skin is warm. No rash noted.  Psychiatric: She has a normal mood and affect.  Nursing note and vitals reviewed.    ED Treatments / Results  Labs (all labs ordered are listed, but only abnormal results are displayed) Labs Reviewed - No data to display  EKG None  Radiology No results found.  Procedures Procedures (including critical care time)  Medications Ordered in ED Medications - No data to display   Initial Impression / Assessment and Plan / ED Course  I have reviewed the triage vital signs and the nursing notes.  Pertinent labs & imaging results that were available during my care of the patient were reviewed by me and considered in my medical decision making (see chart for details).     Patient presenting for evaluation of back pain.  Physical exam shows tenderness palpation of the low back with spasming of bilateral paraspinal muscles.  No fall or injury.  No red flags for back pain.  At this time, doubt vertebral injury, infection, spinal cord compression, myelopathy, or cauda equina syndrome.  Likely muscle spasming/irritation.  I do not believe further imaging is necessary at this time.  Will treat with NSAIDs, muscle relaxers, and lidocaine patch.  Discussed posture/positioning.  Follow-up with primary care if symptoms are not improving.  At this time, patient appears safe for discharge.  Return precautions given.  Patient  states she understands and agrees to plan.   Final Clinical Impressions(s) / ED Diagnoses   Final diagnoses:  Acute midline low back pain without sciatica    ED Discharge Orders        Ordered    lidocaine (LIDODERM) 5 %  Every 24 hours     04/30/17 2247    cyclobenzaprine (FLEXERIL) 5 MG tablet  At bedtime PRN     04/30/17 2247       Odie Rauen, PA-C 04/30/17 2302    Malvin Johns, MD 04/30/17 2312

## 2017-05-09 ENCOUNTER — Other Ambulatory Visit: Payer: Self-pay

## 2017-05-09 ENCOUNTER — Encounter (HOSPITAL_COMMUNITY): Payer: Self-pay | Admitting: *Deleted

## 2017-05-09 ENCOUNTER — Inpatient Hospital Stay (HOSPITAL_COMMUNITY): Payer: Medicaid Other

## 2017-05-09 ENCOUNTER — Inpatient Hospital Stay (HOSPITAL_COMMUNITY)
Admission: AD | Admit: 2017-05-09 | Discharge: 2017-05-09 | Disposition: A | Discharge status: Home / Self Care | Payer: Medicaid Other | Source: Ambulatory Visit | Attending: Obstetrics and Gynecology | Admitting: Obstetrics and Gynecology

## 2017-05-09 DIAGNOSIS — Z3A01 Less than 8 weeks gestation of pregnancy: Secondary | ICD-10-CM | POA: Insufficient documentation

## 2017-05-09 DIAGNOSIS — O26891 Other specified pregnancy related conditions, first trimester: Secondary | ICD-10-CM

## 2017-05-09 DIAGNOSIS — F17210 Nicotine dependence, cigarettes, uncomplicated: Secondary | ICD-10-CM | POA: Insufficient documentation

## 2017-05-09 DIAGNOSIS — O3680X0 Pregnancy with inconclusive fetal viability, not applicable or unspecified: Secondary | ICD-10-CM

## 2017-05-09 DIAGNOSIS — O209 Hemorrhage in early pregnancy, unspecified: Secondary | ICD-10-CM | POA: Insufficient documentation

## 2017-05-09 DIAGNOSIS — O99331 Smoking (tobacco) complicating pregnancy, first trimester: Secondary | ICD-10-CM | POA: Insufficient documentation

## 2017-05-09 DIAGNOSIS — O26899 Other specified pregnancy related conditions, unspecified trimester: Secondary | ICD-10-CM

## 2017-05-09 DIAGNOSIS — R109 Unspecified abdominal pain: Secondary | ICD-10-CM | POA: Diagnosis not present

## 2017-05-09 LAB — CBC
HCT: 41.3 % (ref 36.0–46.0)
Hemoglobin: 14.1 g/dL (ref 12.0–15.0)
MCH: 30.8 pg (ref 26.0–34.0)
MCHC: 34.1 g/dL (ref 30.0–36.0)
MCV: 90.2 fL (ref 78.0–100.0)
PLATELETS: 339 10*3/uL (ref 150–400)
RBC: 4.58 MIL/uL (ref 3.87–5.11)
RDW: 12.8 % (ref 11.5–15.5)
WBC: 13.9 10*3/uL — ABNORMAL HIGH (ref 4.0–10.5)

## 2017-05-09 LAB — WET PREP, GENITAL
Clue Cells Wet Prep HPF POC: NONE SEEN
Sperm: NONE SEEN
Trich, Wet Prep: NONE SEEN
Yeast Wet Prep HPF POC: NONE SEEN

## 2017-05-09 LAB — POCT PREGNANCY, URINE: Preg Test, Ur: POSITIVE — AB

## 2017-05-09 LAB — URINALYSIS, ROUTINE W REFLEX MICROSCOPIC
Bilirubin Urine: NEGATIVE
GLUCOSE, UA: NEGATIVE mg/dL
HGB URINE DIPSTICK: NEGATIVE
KETONES UR: NEGATIVE mg/dL
Nitrite: NEGATIVE
PROTEIN: NEGATIVE mg/dL
Specific Gravity, Urine: 1.017 (ref 1.005–1.030)
pH: 6 (ref 5.0–8.0)

## 2017-05-09 LAB — HCG, QUANTITATIVE, PREGNANCY: HCG, BETA CHAIN, QUANT, S: 2610 m[IU]/mL — AB (ref <5)

## 2017-05-09 LAB — ABO/RH: ABO/RH(D): A POS

## 2017-05-09 NOTE — MAU Note (Signed)
Started cramping and having a burning sensation in her stomach, started about 1330.  Had some spotting at that same time.  +HPT x3

## 2017-05-09 NOTE — MAU Provider Note (Signed)
History     CSN: [id number]  Arrival date and time: 05/09/17 1513   First Provider Initiated Contact with Patient 05/09/17 1612      Chief Complaint  Patient presents with  . Vaginal Bleeding  . Abdominal Pain   HPI Beverly Torres is a 22 y.o. G1P0 at [redacted]w[redacted]d who presents with abdominal pain and vaginal bleeding. Had positive pregnancy test at home. This is her first pregnancy. Describes abdominal pain as a burning sensation in her lower abdomen that comes & goes. Rates pain 4/10. Has not treated symptoms. Had some pink spotting on toilet paper earlier today that has not continued. Denies n/v/d, dysuria.   OB History    Gravida  1   Para      Term      Preterm      AB      Living        SAB      TAB      Ectopic      Multiple      Live Births              Past Medical History:  Diagnosis Date  . Acid reflux   . Anxiety   . Attention deficit disorder   . Chronic chest pain   . Depression     Past Surgical History:  Procedure Laterality Date  . ADENOIDECTOMY    . TONSILLECTOMY    . WISDOM TOOTH EXTRACTION      Family History  Problem Relation Age of Onset  . Anxiety disorder Mother   . Drug abuse Father   . ADD / ADHD Father   . ADD / ADHD Sister   . Anxiety disorder Sister   . ADD / ADHD Brother   . ODD Brother   . Rheum arthritis Maternal Grandmother   . Kidney failure Maternal Grandmother   . Hypertension Maternal Grandfather   . Diabetes Maternal Grandfather   . Arthritis Maternal Grandfather   . Cancer Maternal Grandfather        Melanoma  . Diabetes Paternal Grandfather     Social History   Tobacco Use  . Smoking status: Current Every Day Smoker    Packs/day: 0.50    Types: Cigarettes  . Smokeless tobacco: Never Used  Substance Use Topics  . Alcohol use: No  . Drug use: No    Allergies:  Allergies  Allergen Reactions  . Strawberry (Diagnostic) Itching    Medications Prior to Admission  Medication Sig Dispense Refill  Last Dose  . albuterol (PROVENTIL HFA;VENTOLIN HFA) 108 (90 Base) MCG/ACT inhaler Inhale 2 puffs into the lungs every 6 (six) hours as needed for wheezing or shortness of breath. 18 g 1   . ARIPiprazole (ABILIFY) 2 MG tablet Take 1 tablet (2 mg total) by mouth 2 (two) times daily. For mood control 60 tablet 0 08/31/2016 at Unknown time  . [START ON 09/25/2017] ARIPiprazole ER 400 MG SRER Inject 400 mg into the muscle once. (Due 09-25-16): For mood control 400 mg 1 08/31/2016 at Unknown time  . cyclobenzaprine (FLEXERIL) 5 MG tablet Take 1 tablet (5 mg total) by mouth at bedtime as needed for muscle spasms. 10 tablet 0   . famotidine (PEPCID) 20 MG tablet Take 1 tablet (20 mg total) by mouth 2 (two) times daily. 60 tablet 3 08/31/2016  . gabapentin (NEURONTIN) 100 MG capsule Take 1 capsule (100 mg total) by mouth 2 (two) times daily. For agitation 60 capsule 0  08/31/2016  . hydrOXYzine (ATARAX/VISTARIL) 50 MG tablet Take 1 tablet (50 mg total) by mouth 3 (three) times daily as needed for anxiety. 60 tablet 0 08/31/2016  . ibuprofen (ADVIL,MOTRIN) 600 MG tablet Take 1 tablet (600 mg total) by mouth every 6 (six) hours as needed. 30 tablet 0   . lidocaine (LIDODERM) 5 % Place 1 patch onto the skin daily. Remove & Discard patch within 12 hours or as directed by MD 30 patch 0   . LORazepam (ATIVAN) 1 MG tablet Take 1 tablet (1 mg total) by mouth every 8 (eight) hours as needed for anxiety. 12 tablet 0 08/30/2016  . mupirocin cream (BACTROBAN) 2 % Apply topically 2 (two) times daily. For wound care 15 g 0 Past Week at Unknown time  . naproxen (NAPROSYN) 500 MG tablet Take 1 tablet (500 mg total) by mouth 2 (two) times daily. 30 tablet 0   . nitrofurantoin, macrocrystal-monohydrate, (MACROBID) 100 MG capsule Take 1 capsule (100 mg total) by mouth 2 (two) times daily. 10 capsule 0   . sertraline (ZOLOFT) 100 MG tablet Take 1 tablet (100 mg total) by mouth daily. For depression 30 tablet 0 08/31/2016  . traZODone  (DESYREL) 50 MG tablet Take 1 tablet (50 mg total) by mouth at bedtime as needed for sleep. 30 tablet 0 08/31/2016    Review of Systems  Constitutional: Negative.   Gastrointestinal: Positive for abdominal pain. Negative for nausea and vomiting.  Genitourinary: Positive for vaginal bleeding.   Physical Exam   Blood pressure 126/71, pulse 84, temperature 98.3 F (36.8 C), temperature source Oral, resp. rate 17, weight 212 lb (96.2 kg), last menstrual period 04/08/2017, SpO2 100 %.  Physical Exam  Nursing note and vitals reviewed. Constitutional: She is oriented to person, place, and time. She appears well-developed and well-nourished. No distress.  HENT:  Head: Normocephalic and atraumatic.  Eyes: Conjunctivae are normal. Right eye exhibits no discharge. Left eye exhibits no discharge. No scleral icterus.  Neck: Normal range of motion.  Respiratory: Effort normal. No respiratory distress.  GI: Soft. There is no tenderness.  Neurological: She is alert and oriented to person, place, and time.  Skin: Skin is warm and dry. She is not diaphoretic.  Psychiatric: She has a normal mood and affect. Her behavior is normal. Judgment and thought content normal.    MAU Course  Procedures Results for orders placed or performed during the hospital encounter of 05/09/17 (from the past 24 hour(s))  Urinalysis, Routine w reflex microscopic     Status: Abnormal   Collection Time: 05/09/17  3:50 PM  Result Value Ref Range   Color, Urine YELLOW YELLOW   APPearance CLEAR CLEAR   Specific Gravity, Urine 1.017 1.005 - 1.030   pH 6.0 5.0 - 8.0   Glucose, UA NEGATIVE NEGATIVE mg/dL   Hgb urine dipstick NEGATIVE NEGATIVE   Bilirubin Urine NEGATIVE NEGATIVE   Ketones, ur NEGATIVE NEGATIVE mg/dL   Protein, ur NEGATIVE NEGATIVE mg/dL   Nitrite NEGATIVE NEGATIVE   Leukocytes, UA TRACE (A) NEGATIVE   RBC / HPF 0-5 0 - 5 RBC/hpf   WBC, UA 0-5 0 - 5 WBC/hpf   Bacteria, UA RARE (A) NONE SEEN   Squamous  Epithelial / LPF 0-5 (A) NONE SEEN   Mucus PRESENT   Pregnancy, urine POC     Status: Abnormal   Collection Time: 05/09/17  4:01 PM  Result Value Ref Range   Preg Test, Ur POSITIVE (A) NEGATIVE  CBC  Status: Abnormal   Collection Time: 05/09/17  4:44 PM  Result Value Ref Range   WBC 13.9 (H) 4.0 - 10.5 K/uL   RBC 4.58 3.87 - 5.11 MIL/uL   Hemoglobin 14.1 12.0 - 15.0 g/dL   HCT 41.3 36.0 - 46.0 %   MCV 90.2 78.0 - 100.0 fL   MCH 30.8 26.0 - 34.0 pg   MCHC 34.1 30.0 - 36.0 g/dL   RDW 12.8 11.5 - 15.5 %   Platelets 339 150 - 400 K/uL  hCG, quantitative, pregnancy     Status: Abnormal   Collection Time: 05/09/17  4:44 PM  Result Value Ref Range   hCG, Beta Chain, Quant, S 2,610 (H) <5 mIU/mL  ABO/Rh     Status: None (Preliminary result)   Collection Time: 05/09/17  4:44 PM  Result Value Ref Range   ABO/RH(D)      A POS Performed at [facility], [address], [place], [place] [address]   Wet prep, genital     Status: Abnormal   Collection Time: 05/09/17  6:53 PM  Result Value Ref Range   Yeast Wet Prep HPF POC NONE SEEN NONE SEEN   Trich, Wet Prep NONE SEEN NONE SEEN   Clue Cells Wet Prep HPF POC NONE SEEN NONE SEEN   WBC, Wet Prep HPF POC MANY (A) NONE SEEN   Sperm NONE SEEN    US Ob Less Than 14 Weeks With Ob Transvaginal  Result Date: 05/09/2017 CLINICAL DATA:  Abdominal pain affecting first trimester of pregnancy EXAM: OBSTETRIC <14 WK [place] AND TRANSVAGINAL OB US TECHNIQUE: Both transabdominal and transvaginal ultrasound examinations were performed for complete evaluation of the gestation as well as the maternal uterus, adnexal regions, and pelvic cul-de-sac. Transvaginal technique was performed to assess early pregnancy. COMPARISON:  None for this gestation FINDINGS: Intrauterine gestational sac: Present Yolk sac:  Not visualized Embryo:  Not visualize Cardiac Activity: N/A Heart Rate: N/A  bpm MSD: 3.7 mm   5 w   2 d Subchorionic hemorrhage: Question minimal  endometrial fluid versus unlikely second gestational sac. Endometrium appears heterogeneous. Maternal uterus/adnexae: No free pelvic fluid or adnexal masses. RIGHT ovary normal size and morphology, 3.9 x 1.6 x 2.2 cm. LEFT ovary normal size and morphology 2.4 x 1.4 x 1.7 cm IMPRESSION: Tiny gestational sac within the uterus without visualization of a yolk sac or fetal pole. Heterogeneous appearance of the endometrial complex with likely a minimal amount of endometrial fluid as above; may consider follow-up sonographic assessment in 10-14 days to assess for viability. Electronically Signed   By: Lavonia Dana M.D.   On: 05/09/2017 17:38    MDM +UPT UA, wet prep, GC/chlamydia, CBC, ABO/Rh, quant hCG, HIV, and [place] today to rule out ectopic pregnancy A positive  Ultrasound shows ? IUGS, no adnexal mass.  This abdominal pain could represent a normal pregnancy, spontaneous abortion, or even an ectopic pregnancy which can be life-threatening. Cultures were obtained to rule out pelvic infection.    Assessment and Plan  A: 1. Pregnancy of unknown anatomic location   2. Abdominal pain affecting pregnancy    P: Discharge home Return to MAU Thursday after work for repeat HCG GC/CT pending Ectopic vs SAB precautions  Jorje Guild 05/09/2017, 4:15 PM

## 2017-05-09 NOTE — Discharge Instructions (Signed)
Return to care   If you have heavier bleeding that soaks through more that 2 pads per hour for an hour or more  If you bleed so much that you feel like you might pass out or you do pass out  If you have significant abdominal pain that is not improved with Tylenol   If you develop a fever > 100.5    First Trimester of Pregnancy The first trimester of pregnancy is from week 1 until the end of week 13 (months 1 through 3). During this time, your baby will begin to develop inside you. At 6-8 weeks, the eyes and face are formed, and the heartbeat can be seen on ultrasound. At the end of 12 weeks, all the baby's organs are formed. Prenatal care is all the medical care you receive before the birth of your baby. Make sure you get good prenatal care and follow all of your doctor's instructions. Follow these instructions at home: Medicines  Take over-the-counter and prescription medicines only as told by your doctor. Some medicines are safe and some medicines are not safe during pregnancy.  Take a prenatal vitamin that contains at least 600 micrograms (mcg) of folic acid.  If you have trouble pooping (constipation), take medicine that will make your stool soft (stool softener) if your doctor approves. Eating and drinking  Eat regular, healthy meals.  Your doctor will tell you the amount of weight gain that is right for you.  Avoid raw meat and uncooked cheese.  If you feel sick to your stomach (nauseous) or throw up (vomit): ? Eat 4 or 5 small meals a day instead of 3 large meals. ? Try eating a few soda crackers. ? Drink liquids between meals instead of during meals.  To prevent constipation: ? Eat foods that are high in fiber, like fresh fruits and vegetables, whole grains, and beans. ? Drink enough fluids to keep your pee (urine) clear or pale yellow. Activity  Exercise only as told by your doctor. Stop exercising if you have cramps or pain in your lower belly (abdomen) or low  back.  Do not exercise if it is too hot, too humid, or if you are in a place of great height (high altitude).  Try to avoid standing for long periods of time. Move your legs often if you must stand in one place for a long time.  Avoid heavy lifting.  Wear low-heeled shoes. Sit and stand up straight.  You can have sex unless your doctor tells you not to. Relieving pain and discomfort  Wear a good support bra if your breasts are sore.  Take warm water baths (sitz baths) to soothe pain or discomfort caused by hemorrhoids. Use hemorrhoid cream if your doctor says it is okay.  Rest with your legs raised if you have leg cramps or low back pain.  If you have puffy, bulging veins (varicose veins) in your legs: ? Wear support hose or compression stockings as told by your doctor. ? Raise (elevate) your feet for 15 minutes, 3-4 times a day. ? Limit salt in your food. Prenatal care  Schedule your prenatal visits by the twelfth week of pregnancy.  Write down your questions. Take them to your prenatal visits.  Keep all your prenatal visits as told by your doctor. This is important. Safety  Wear your seat belt at all times when driving.  Make a list of emergency phone numbers. The list should include numbers for family, friends, the hospital, and police and  Public relations account executive. General instructions  Ask your doctor for a referral to a local prenatal class. Begin classes no later than at the start of month 6 of your pregnancy.  Ask for help if you need counseling or if you need help with nutrition. Your doctor can give you advice or tell you where to go for help.  Do not use hot tubs, steam rooms, or saunas.  Do not douche or use tampons or scented sanitary pads.  Do not cross your legs for long periods of time.  Avoid all herbs and alcohol. Avoid drugs that are not approved by your doctor.  Do not use any tobacco products, including cigarettes, chewing tobacco, and electronic  cigarettes. If you need help quitting, ask your doctor. You may get counseling or other support to help you quit.  Avoid cat litter boxes and soil used by cats. These carry germs that can cause birth defects in the baby and can cause a loss of your baby (miscarriage) or stillbirth.  Visit your dentist. At home, brush your teeth with a soft toothbrush. Be gentle when you floss. Contact a doctor if:  You are dizzy.  You have mild cramps or pressure in your lower belly.  You have a nagging pain in your belly area.  You continue to feel sick to your stomach, you throw up, or you have watery poop (diarrhea).  You have a bad smelling fluid coming from your vagina.  You have pain when you pee (urinate).  You have increased puffiness (swelling) in your face, hands, legs, or ankles. Get help right away if:  You have a fever.  You are leaking fluid from your vagina.  You have spotting or bleeding from your vagina.  You have very bad belly cramping or pain.  You gain or lose weight rapidly.  You throw up blood. It may look like coffee grounds.  You are around people who have [place] measles, fifth disease, or chickenpox.  You have a very bad headache.  You have shortness of breath.  You have any kind of trauma, such as from a fall or a car accident. Summary  The first trimester of pregnancy is from week 1 until the end of week 13 (months 1 through 3).  To take care of yourself and your unborn baby, you will need to eat healthy meals, take medicines only if your doctor tells you to do so, and do activities that are safe for you and your baby.  Keep all follow-up visits as told by your doctor. This is important as your doctor will have to ensure that your baby is healthy and growing well. This information is not intended to replace advice given to you by your health care provider. Make sure you discuss any questions you have with your health care provider. Document Released:  07/14/2007 Document Revised: 02/03/2016 Document Reviewed: 02/03/2016 Elsevier Interactive Patient Education  2017 [employer].

## 2017-05-10 LAB — GC/CHLAMYDIA PROBE AMP (~~LOC~~) NOT AT ARMC
Chlamydia: NEGATIVE
NEISSERIA GONORRHEA: NEGATIVE

## 2017-05-12 ENCOUNTER — Inpatient Hospital Stay (HOSPITAL_COMMUNITY)
Admission: AD | Admit: 2017-05-12 | Discharge: 2017-05-12 | Disposition: A | Discharge status: Home / Self Care | Payer: Medicaid Other | Source: Ambulatory Visit | Attending: Obstetrics & Gynecology | Admitting: Obstetrics & Gynecology

## 2017-05-12 DIAGNOSIS — R109 Unspecified abdominal pain: Secondary | ICD-10-CM

## 2017-05-12 DIAGNOSIS — O26891 Other specified pregnancy related conditions, first trimester: Secondary | ICD-10-CM | POA: Diagnosis not present

## 2017-05-12 DIAGNOSIS — Z3A01 Less than 8 weeks gestation of pregnancy: Secondary | ICD-10-CM | POA: Insufficient documentation

## 2017-05-12 LAB — HCG, QUANTITATIVE, PREGNANCY: HCG, BETA CHAIN, QUANT, S: 7771 m[IU]/mL — AB (ref <5)

## 2017-05-12 MED ORDER — PRENATAL VITAMINS 0.8 MG PO TABS: 1.0000 | ORAL_TABLET | Freq: Every day | ORAL | 12 refills | Status: DC

## 2017-05-12 NOTE — Discharge Instructions (Signed)
[place] for [employer] at [facility]       Phone: [phone]  [place] [facility] [employer] at [employer] Phone: [place] for [employer] at [place]  Phone: [place] for [employer] at [employer]  Phone: [place] for [place] at [place]  Phone: [facility]       Phone: [phone]  [facility]    Phone: [phone]   [facility])    Phone: [facility]    Phone: [phone]  [facility]    Phone: [phone]  [employer]    Phone: [phone]  [place] [facility]       Phone: [phone]   [place] [facility]  Phone: [place]    Phone: [phone]  [facility]   Phone: [phone]  [facility]      Phone: [phone]  [facility]    Phone: [phone]   Prenatal Care WHAT IS PRENATAL CARE? Prenatal care is the process of caring for a pregnant woman before she gives birth. Prenatal care makes sure that she and her baby remain as healthy as possible throughout pregnancy. Prenatal care may be provided by a midwife, family practice health care provider, or a childbirth and pregnancy specialist (obstetrician). Prenatal care may include physical examinations, testing, treatments, and education on nutrition, lifestyle, and social support services. WHY IS PRENATAL CARE SO IMPORTANT? Early and consistent prenatal care increases the chance that you and your baby will remain healthy throughout your pregnancy. This type of care also decreases a babys risk of being born too early (prematurely), or being born smaller than expected (small for gestational age). Any  underlying medical conditions you may have that could pose a risk during your pregnancy are discussed during prenatal care visits. You will also be monitored regularly for any new conditions that may arise during your pregnancy so they can be treated quickly and effectively. WHAT HAPPENS DURING PRENATAL CARE VISITS? Prenatal care visits may include the following: Discussion Tell your health care provider about any new signs or symptoms you have experienced since your last visit. These might include:  Nausea or vomiting.  Increased or decreased level of energy.  Difficulty sleeping.  Back or leg pain.  Weight changes.  Frequent urination.  Shortness of breath with physical activity.  Changes in your skin, such as the development of a rash or itchiness.  Vaginal discharge or bleeding.  Feelings of excitement or nervousness.  Changes in your babys movements.  You may want to write down any questions or topics you want to discuss with your health care provider and bring them with you to your appointment. Examination During your first prenatal care visit, you will likely have a complete physical exam. Your health care provider will often examine your vagina, cervix, and the position of your uterus, as well as check your heart, lungs, and other body systems. As your pregnancy progresses, your health care provider will measure the size of your uterus and your babys position inside your uterus. He or she may also examine you for early signs of labor. Your prenatal visits may also include checking your blood pressure and, after about 10-12 weeks of pregnancy, listening to your babys heartbeat. Testing Regular testing often includes:  Urinalysis. This checks your urine for glucose, protein,  or signs of infection.  Blood count. This checks the levels of white and red blood cells in your body.  Tests for sexually transmitted infections (STIs). Testing for STIs at the beginning of  pregnancy is routinely done and is required in many states.  Antibody testing. You will be checked to see if you are immune to certain illnesses, such as rubella, that can affect a developing fetus.  Glucose screen. Around 24-28 weeks of pregnancy, your blood glucose level will be checked for signs of gestational diabetes. Follow-up tests may be recommended.  Group B strep. This is a bacteria that is commonly found inside a womans vagina. This test will inform your health care provider if you need an antibiotic to reduce the amount of this bacteria in your body prior to labor and childbirth.  Ultrasound. Many pregnant women undergo an ultrasound screening around 18-20 weeks of pregnancy to evaluate the health of the fetus and check for any developmental abnormalities.  HIV (human immunodeficiency virus) testing. Early in your pregnancy, you will be screened for HIV. If you are at high risk for HIV, this test may be repeated during your third trimester of pregnancy.  You may be offered other testing based on your age, personal or family medical history, or other factors. HOW OFTEN SHOULD I PLAN TO SEE MY HEALTH CARE PROVIDER FOR PRENATAL CARE? Your prenatal care check-up schedule depends on any medical conditions you have before, or develop during, your pregnancy. If you do not have any underlying medical conditions, you will likely be seen for checkups:  Monthly, during the first 6 months of pregnancy.  Twice a month during months 7 and 8 of pregnancy.  Weekly starting in the 9th month of pregnancy and until delivery.  If you develop signs of early labor or other concerning signs or symptoms, you may need to see your health care provider more often. Ask your health care provider what prenatal care schedule is best for you. WHAT CAN I DO TO KEEP MYSELF AND MY BABY AS HEALTHY AS POSSIBLE DURING MY PREGNANCY?  Take a prenatal vitamin containing 400 micrograms (0.4 mg) of folic acid every day.  Your health care provider may also ask you to take additional vitamins such as iodine, vitamin D, iron, copper, and zinc.  Take 1500-2000 mg of calcium daily starting at your 20th week of pregnancy until you deliver your baby.  Make sure you are up to date on your vaccinations. Unless directed otherwise by your health care provider: ? You should receive a tetanus, diphtheria, and pertussis (Tdap) vaccination between the 27th and 36th week of your pregnancy, regardless of when your last Tdap immunization occurred. This helps protect your baby from whooping cough (pertussis) after he or she is born. ? You should receive an annual inactivated influenza vaccine (IIV) to help protect you and your baby from influenza. This can be done at any point during your pregnancy.  Eat a well-rounded diet that includes: ? Fresh fruits and vegetables. ? Lean proteins. ? Calcium-rich foods such as milk, yogurt, hard cheeses, and dark, leafy greens. ? Whole grain breads.  Do noteat seafood high in mercury, including: ? Swordfish. ? Tilefish. ? Shark. ? King mackerel. ? More than 6 oz tuna per week.  Do not eat: ? Raw or undercooked meats or eggs. ? Unpasteurized foods, such as soft cheeses (brie, blue, or feta), juices, and milks. ? Lunch meats. ? Hot dogs that have not been heated until they are steaming.  Drink enough water to keep your urine clear or pale yellow. For many women, this may be 10 or more 8 oz glasses of water each day. Keeping yourself hydrated helps deliver nutrients to your baby and may prevent the start of pre-term uterine contractions.  Do not use any tobacco products including cigarettes, chewing tobacco, or electronic cigarettes. If you need help quitting, ask your health care provider.  Do not drink beverages containing alcohol. No safe level of alcohol consumption during pregnancy has been determined.  Do not use any illegal drugs. These can harm your developing baby or cause a  miscarriage.  Ask your health care provider or pharmacist before taking any prescription or over-the-counter medicines, herbs, or supplements.  Limit your caffeine intake to no more than 200 mg per day.  Exercise. Unless told otherwise by your health care provider, try to get 30 minutes of moderate exercise most days of the week. Do not  do high-impact activities, contact sports, or activities with a high risk of falling, such as horseback riding or downhill skiing.  Get plenty of rest.  Avoid anything that raises your body temperature, such as hot tubs and saunas.  If you own a cat, do not empty its litter box. Bacteria contained in cat feces can cause an infection called toxoplasmosis. This can result in serious harm to the fetus.  Stay away from chemicals such as insecticides, lead, mercury, and cleaning or paint products that contain solvents.  Do not have any X-rays taken unless medically necessary.  Take a childbirth and breastfeeding preparation class. Ask your health care provider if you need a referral or recommendation.  This information is not intended to replace advice given to you by your health care provider. Make sure you discuss any questions you have with your health care provider. Document Released: 01/28/2003 Document Revised: 06/30/2015 Document Reviewed: 04/11/2013 Elsevier Interactive Patient Education  2017 [employer].

## 2017-05-12 NOTE — MAU Note (Signed)
Pt here for f/u BHCG. Denies any pain or bleeding at this time

## 2017-05-12 NOTE — MAU Provider Note (Signed)
   Subjective   [name]  is a 22 y.o. G1P0 at [redacted]w[redacted]d who presents to MAU today for follow-up quant hCG after 48 hours.   She was seen in MAU with pregnancy of unknown location on 05/09/2017  Current symptoms include: no pain, no bleeding  Objective  Physical Examination: General appearance - alert, well appearing, and in no distress Chest - normal effort Heart - normal rate and regular rhythm Abdomen - soft, nontender, nondistended, no masses or organomegaly Neurological - alert, oriented, normal speech, no focal findings or movement disorder noted Extremities - no pedal edema noted  Lab Results  Component Value Date   HCGBETAQNT 7,771 (H) 05/12/2017   HCGBETAQNT 2,610 (H) 05/09/2017   HCGBETAQNT <1 06/26/2016    US Ob Less Than 14 Weeks With Ob Transvaginal  Result Date: 05/09/2017 CLINICAL DATA:  Abdominal pain affecting first trimester of pregnancy EXAM: OBSTETRIC <14 WK [place] AND TRANSVAGINAL OB US TECHNIQUE: Both transabdominal and transvaginal ultrasound examinations were performed for complete evaluation of the gestation as well as the maternal uterus, adnexal regions, and pelvic cul-de-sac. Transvaginal technique was performed to assess early pregnancy. COMPARISON:  None for this gestation FINDINGS: Intrauterine gestational sac: Present Yolk sac:  Not visualized Embryo:  Not visualize Cardiac Activity: N/A Heart Rate: N/A  bpm MSD: 3.7 mm   5 w   2 d Subchorionic hemorrhage: Question minimal endometrial fluid versus unlikely second gestational sac. Endometrium appears heterogeneous. Maternal uterus/adnexae: No free pelvic fluid or adnexal masses. RIGHT ovary normal size and morphology, 3.9 x 1.6 x 2.2 cm. LEFT ovary normal size and morphology 2.4 x 1.4 x 1.7 cm IMPRESSION: Tiny gestational sac within the uterus without visualization of a yolk sac or fetal pole. Heterogeneous appearance of the endometrial complex with likely a minimal amount of endometrial fluid as above; may  consider follow-up sonographic assessment in 10-14 days to assess for viability. Electronically Signed   By: Lavonia Dana M.D.   On: 05/09/2017 17:38    ASSESSMENT: Normally rising BHCG Long psych history  PLAN: Begin PNV's Reviewed meds--she has stopped all her psych meds--should not be on gabapentin or ativan for sure--f/u with psych--->zoloft is safest in pregnancy, may need mood stabilizer Begin prenatal care.

## 2017-06-20 ENCOUNTER — Encounter: Payer: Self-pay | Admitting: Certified Nurse Midwife

## 2017-06-22 ENCOUNTER — Encounter (HOSPITAL_COMMUNITY): Payer: Self-pay | Admitting: *Deleted

## 2017-06-28 ENCOUNTER — Inpatient Hospital Stay (HOSPITAL_COMMUNITY)
Admission: AD | Admit: 2017-06-28 | Discharge: 2017-06-28 | Disposition: A | Discharge status: Home / Self Care | Payer: Medicaid Other | Source: Ambulatory Visit | Attending: Obstetrics and Gynecology | Admitting: Obstetrics and Gynecology

## 2017-06-28 ENCOUNTER — Encounter (HOSPITAL_COMMUNITY): Payer: Self-pay

## 2017-06-28 DIAGNOSIS — Z3A11 11 weeks gestation of pregnancy: Secondary | ICD-10-CM | POA: Diagnosis not present

## 2017-06-28 DIAGNOSIS — F419 Anxiety disorder, unspecified: Secondary | ICD-10-CM | POA: Diagnosis not present

## 2017-06-28 DIAGNOSIS — K219 Gastro-esophageal reflux disease without esophagitis: Secondary | ICD-10-CM | POA: Diagnosis not present

## 2017-06-28 DIAGNOSIS — F17210 Nicotine dependence, cigarettes, uncomplicated: Secondary | ICD-10-CM | POA: Diagnosis not present

## 2017-06-28 DIAGNOSIS — O99331 Smoking (tobacco) complicating pregnancy, first trimester: Secondary | ICD-10-CM | POA: Diagnosis not present

## 2017-06-28 DIAGNOSIS — F329 Major depressive disorder, single episode, unspecified: Secondary | ICD-10-CM | POA: Diagnosis not present

## 2017-06-28 DIAGNOSIS — O26891 Other specified pregnancy related conditions, first trimester: Secondary | ICD-10-CM | POA: Diagnosis not present

## 2017-06-28 DIAGNOSIS — R109 Unspecified abdominal pain: Secondary | ICD-10-CM | POA: Diagnosis not present

## 2017-06-28 DIAGNOSIS — O99611 Diseases of the digestive system complicating pregnancy, first trimester: Secondary | ICD-10-CM | POA: Insufficient documentation

## 2017-06-28 DIAGNOSIS — Z79899 Other long term (current) drug therapy: Secondary | ICD-10-CM | POA: Insufficient documentation

## 2017-06-28 DIAGNOSIS — O99341 Other mental disorders complicating pregnancy, first trimester: Secondary | ICD-10-CM | POA: Insufficient documentation

## 2017-06-28 DIAGNOSIS — O26899 Other specified pregnancy related conditions, unspecified trimester: Secondary | ICD-10-CM

## 2017-06-28 LAB — WET PREP, GENITAL
Clue Cells Wet Prep HPF POC: NONE SEEN
Sperm: NONE SEEN
Trich, Wet Prep: NONE SEEN
YEAST WET PREP: NONE SEEN

## 2017-06-28 LAB — URINALYSIS, ROUTINE W REFLEX MICROSCOPIC
Bilirubin Urine: NEGATIVE
Glucose, UA: NEGATIVE mg/dL
HGB URINE DIPSTICK: NEGATIVE
Ketones, ur: 20 mg/dL — AB
Nitrite: NEGATIVE
Protein, ur: NEGATIVE mg/dL
SPECIFIC GRAVITY, URINE: 1.013 (ref 1.005–1.030)
pH: 6 (ref 5.0–8.0)

## 2017-06-28 MED ORDER — FLUCONAZOLE 150 MG PO TABS: 150.0000 mg | ORAL_TABLET | Freq: Every day | ORAL | 0 refills | Status: DC

## 2017-06-28 NOTE — MAU Note (Signed)
Pt presents to MAU with c/o lower abdominal cramping that radiates to her back, pain started today. Pt denies VB and urinary symptoms.

## 2017-06-28 NOTE — Discharge Instructions (Signed)
First Trimester of Pregnancy The first trimester of pregnancy is from week 1 until the end of week 13 (months 1 through 3). During this time, your baby will begin to develop inside you. At 6-8 weeks, the eyes and face are formed, and the heartbeat can be seen on ultrasound. At the end of 12 weeks, all the baby's organs are formed. Prenatal care is all the medical care you receive before the birth of your baby. Make sure you get good prenatal care and follow all of your doctor's instructions. Follow these instructions at home: Medicines  Take over-the-counter and prescription medicines only as told by your doctor. Some medicines are safe and some medicines are not safe during pregnancy.  Take a prenatal vitamin that contains at least 600 micrograms (mcg) of folic acid.  If you have trouble pooping (constipation), take medicine that will make your stool soft (stool softener) if your doctor approves. Eating and drinking  Eat regular, healthy meals.  Your doctor will tell you the amount of weight gain that is right for you.  Avoid raw meat and uncooked cheese.  If you feel sick to your stomach (nauseous) or throw up (vomit): ? Eat 4 or 5 small meals a day instead of 3 large meals. ? Try eating a few soda crackers. ? Drink liquids between meals instead of during meals.  To prevent constipation: ? Eat foods that are high in fiber, like fresh fruits and vegetables, whole grains, and beans. ? Drink enough fluids to keep your pee (urine) clear or pale yellow. Activity  Exercise only as told by your doctor. Stop exercising if you have cramps or pain in your lower belly (abdomen) or low back.  Do not exercise if it is too hot, too humid, or if you are in a place of great height (high altitude).  Try to avoid standing for long periods of time. Move your legs often if you must stand in one place for a long time.  Avoid heavy lifting.  Wear low-heeled shoes. Sit and stand up straight.  You  can have sex unless your doctor tells you not to. Relieving pain and discomfort  Wear a good support bra if your breasts are sore.  Take warm water baths (sitz baths) to soothe pain or discomfort caused by hemorrhoids. Use hemorrhoid cream if your doctor says it is okay.  Rest with your legs raised if you have leg cramps or low back pain.  If you have puffy, bulging veins (varicose veins) in your legs: ? Wear support hose or compression stockings as told by your doctor. ? Raise (elevate) your feet for 15 minutes, 3-4 times a day. ? Limit salt in your food. Prenatal care  Schedule your prenatal visits by the twelfth week of pregnancy.  Write down your questions. Take them to your prenatal visits.  Keep all your prenatal visits as told by your doctor. This is important. Safety  Wear your seat belt at all times when driving.  Make a list of emergency phone numbers. The list should include numbers for family, friends, the hospital, and police and fire departments. General instructions  Ask your doctor for a referral to a local prenatal class. Begin classes no later than at the start of month 6 of your pregnancy.  Ask for help if you need counseling or if you need help with nutrition. Your doctor can give you advice or tell you where to go for help.  Do not use hot tubs, steam rooms, or   saunas.  Do not douche or use tampons or scented sanitary pads.  Do not cross your legs for long periods of time.  Avoid all herbs and alcohol. Avoid drugs that are not approved by your doctor.  Do not use any tobacco products, including cigarettes, chewing tobacco, and electronic cigarettes. If you need help quitting, ask your doctor. You may get counseling or other support to help you quit.  Avoid cat litter boxes and soil used by cats. These carry germs that can cause birth defects in the baby and can cause a loss of your baby (miscarriage) or stillbirth.  Visit your dentist. At home, brush  your teeth with a soft toothbrush. Be gentle when you floss. Contact a doctor if:  You are dizzy.  You have mild cramps or pressure in your lower belly.  You have a nagging pain in your belly area.  You continue to feel sick to your stomach, you throw up, or you have watery poop (diarrhea).  You have a bad smelling fluid coming from your vagina.  You have pain when you pee (urinate).  You have increased puffiness (swelling) in your face, hands, legs, or ankles. Get help right away if:  You have a fever.  You are leaking fluid from your vagina.  You have spotting or bleeding from your vagina.  You have very bad belly cramping or pain.  You gain or lose weight rapidly.  You throw up blood. It may look like coffee grounds.  You are around people who have German measles, fifth disease, or chickenpox.  You have a very bad headache.  You have shortness of breath.  You have any kind of trauma, such as from a fall or a car accident. Summary  The first trimester of pregnancy is from week 1 until the end of week 13 (months 1 through 3).  To take care of yourself and your unborn baby, you will need to eat healthy meals, take medicines only if your doctor tells you to do so, and do activities that are safe for you and your baby.  Keep all follow-up visits as told by your doctor. This is important as your doctor will have to ensure that your baby is healthy and growing well. This information is not intended to replace advice given to you by your health care provider. Make sure you discuss any questions you have with your health care provider. Document Released: 07/14/2007 Document Revised: 02/03/2016 Document Reviewed: 02/03/2016 Elsevier Interactive Patient Education  2017 [employer]  

## 2017-06-28 NOTE — MAU Provider Note (Signed)
History    Patient Beverly Torres is a 22 y.o. G1P0 at [redacted]w[redacted]d here with complaints of abdominal pain and vaginal bleeding. She also feels some pelvic pressure when she urinates. Denies other abnormal discharge, bright red bleeding, NV or other ob-gyn complaints. She has not started prenatal care as she is waiting for her pregnancy Medicaid card to be approved.   CSN: [id number]  Arrival date and time: 06/28/17 1459   First Provider Initiated Contact with Patient 06/28/17 1540      Chief Complaint  Patient presents with  . Abdominal Pain  . Back Pain   Abdominal Pain  This is a new problem. The current episode started today. The onset quality is sudden. The problem occurs intermittently. The problem has been unchanged. The pain is located in the suprapubic region. The pain is at a severity of 6/10. The quality of the pain is cramping. The abdominal pain radiates to the back. Associated symptoms include dysuria. Pertinent negatives include no constipation, diarrhea or vomiting. Nothing aggravates the pain. The pain is relieved by nothing. She has tried acetaminophen for the symptoms.  Vaginal Bleeding  The patient's primary symptoms include vaginal bleeding. The patient's pertinent negatives include no genital itching or missed menses. This is a new problem. The current episode started today. The problem occurs intermittently. The problem has been resolved. Associated symptoms include abdominal pain and dysuria. Pertinent negatives include no constipation, diarrhea or vomiting. The vaginal bleeding is spotting. She has not been passing clots. Nothing aggravates the symptoms. She has tried nothing for the symptoms.    OB History    Gravida  1   Para      Term      Preterm      AB      Living        SAB      TAB      Ectopic      Multiple      Live Births              Past Medical History:  Diagnosis Date  . Acid reflux   . Anxiety   . Attention deficit disorder   .  Chronic chest pain   . Depression     Past Surgical History:  Procedure Laterality Date  . ADENOIDECTOMY    . TONSILLECTOMY    . WISDOM TOOTH EXTRACTION      Family History  Problem Relation Age of Onset  . Anxiety disorder Mother   . Drug abuse Father   . ADD / ADHD Father   . ADD / ADHD Sister   . Anxiety disorder Sister   . ADD / ADHD Brother   . ODD Brother   . Rheum arthritis Maternal Grandmother   . Kidney failure Maternal Grandmother   . Hypertension Maternal Grandfather   . Diabetes Maternal Grandfather   . Arthritis Maternal Grandfather   . Cancer Maternal Grandfather        Melanoma  . Diabetes Paternal Grandfather     Social History   Tobacco Use  . Smoking status: Current Every Day Smoker    Packs/day: 0.25    Types: Cigarettes  . Smokeless tobacco: Never Used  Substance Use Topics  . Alcohol use: No  . Drug use: No    Allergies:  Allergies  Allergen Reactions  . Strawberry (Diagnostic) Itching    Medications Prior to Admission  Medication Sig Dispense Refill Last Dose  . albuterol (PROVENTIL HFA;VENTOLIN HFA) 108 (  90 Base) MCG/ACT inhaler Inhale 2 puffs into the lungs every 6 (six) hours as needed for wheezing or shortness of breath. 18 g 1   . ARIPiprazole (ABILIFY) 2 MG tablet Take 1 tablet (2 mg total) by mouth 2 (two) times daily. For mood control 60 tablet 0 08/31/2016 at Unknown time  . [START ON 09/25/2017] ARIPiprazole ER 400 MG SRER Inject 400 mg into the muscle once. (Due 09-25-16): For mood control 400 mg 1 08/31/2016 at Unknown time  . cyclobenzaprine (FLEXERIL) 5 MG tablet Take 1 tablet (5 mg total) by mouth at bedtime as needed for muscle spasms. 10 tablet 0   . famotidine (PEPCID) 20 MG tablet Take 1 tablet (20 mg total) by mouth 2 (two) times daily. 60 tablet 3 08/31/2016  . hydrOXYzine (ATARAX/VISTARIL) 50 MG tablet Take 1 tablet (50 mg total) by mouth 3 (three) times daily as needed for anxiety. 60 tablet 0 08/31/2016  . lidocaine  (LIDODERM) 5 % Place 1 patch onto the skin daily. Remove & Discard patch within 12 hours or as directed by MD 30 patch 0   . LORazepam (ATIVAN) 1 MG tablet Take 1 tablet (1 mg total) by mouth every 8 (eight) hours as needed for anxiety. 12 tablet 0 08/30/2016  . Prenatal Multivit-Min-Fe-FA (PRENATAL VITAMINS) 0.8 MG tablet Take 1 tablet by mouth daily. 30 tablet 12   . sertraline (ZOLOFT) 100 MG tablet Take 1 tablet (100 mg total) by mouth daily. For depression 30 tablet 0 08/31/2016  . traZODone (DESYREL) 50 MG tablet Take 1 tablet (50 mg total) by mouth at bedtime as needed for sleep. 30 tablet 0 08/31/2016    Review of Systems  Constitutional: Negative.   HENT: Negative.   Respiratory: Negative.   Cardiovascular: Negative.   Gastrointestinal: Positive for abdominal pain. Negative for constipation, diarrhea and vomiting.  Genitourinary: Positive for dysuria and vaginal bleeding. Negative for missed menses.  Neurological: Negative.    Physical Exam   Blood pressure (!) 109/58, pulse 100, temperature 98.6 F (37 C), temperature source Oral, resp. rate 18, height 5\' 2"  (1.575 m), weight 216 lb (98 kg), last menstrual period 04/08/2017.  Physical Exam  Constitutional: She is oriented to person, place, and time. She appears well-developed.  HENT:  Head: Normocephalic.  Neck: Normal range of motion.  GI: Soft.  Genitourinary:  Genitourinary Comments: Normal external female genitalia; No blood in the vagina, no lesions on vaginal walls or cervix. No CMT, suprapubic or adnexal tenderness.   Musculoskeletal: Normal range of motion.  Neurological: She is alert and oriented to person, place, and time.  Skin: Skin is warm and dry.  Psychiatric: She has a normal mood and affect.    MAU Course  Procedures  MDM -UA: appears without infection, but will send for culture to be sure -Gc chlamydia pending -wet prep negative FHR by Doppler is 154  Assessment and Plan   1. Abdominal pain in  pregnancy    2. Patient stable for discharge; reviewed warning signs with patient (bleeding, increased pain).   3. Explained that, given heart tones by Doppler, there is not a need for an emergent [place] today.   4. Patient stable for discharge; patient will call [facility] to set up her prenatal visit.    Mervyn Skeeters [place] 06/28/2017, 3:52 PM

## 2017-06-29 LAB — GC/CHLAMYDIA PROBE AMP (~~LOC~~) NOT AT ARMC
Chlamydia: NEGATIVE
Neisseria Gonorrhea: NEGATIVE

## 2017-06-30 LAB — CULTURE, OB URINE

## 2017-07-12 DIAGNOSIS — O0990 Supervision of high risk pregnancy, unspecified, unspecified trimester: Secondary | ICD-10-CM | POA: Insufficient documentation

## 2017-07-13 ENCOUNTER — Ambulatory Visit (INDEPENDENT_AMBULATORY_CARE_PROVIDER_SITE_OTHER): Payer: Medicaid Other | Admitting: Certified Nurse Midwife

## 2017-07-13 ENCOUNTER — Other Ambulatory Visit (HOSPITAL_COMMUNITY)
Admission: RE | Admit: 2017-07-13 | Discharge: 2017-07-13 | Disposition: A | Discharge status: Home / Self Care | Payer: Medicaid Other | Source: Ambulatory Visit | Attending: Certified Nurse Midwife | Admitting: Certified Nurse Midwife

## 2017-07-13 ENCOUNTER — Encounter: Payer: Self-pay | Admitting: Certified Nurse Midwife

## 2017-07-13 VITALS — BP 114/72 | HR 105 | Wt 219.8 lb

## 2017-07-13 DIAGNOSIS — Z3490 Encounter for supervision of normal pregnancy, unspecified, unspecified trimester: Secondary | ICD-10-CM

## 2017-07-13 DIAGNOSIS — O99332 Smoking (tobacco) complicating pregnancy, second trimester: Secondary | ICD-10-CM

## 2017-07-13 DIAGNOSIS — O9989 Other specified diseases and conditions complicating pregnancy, childbirth and the puerperium: Secondary | ICD-10-CM | POA: Diagnosis not present

## 2017-07-13 DIAGNOSIS — Z3481 Encounter for supervision of other normal pregnancy, first trimester: Secondary | ICD-10-CM

## 2017-07-13 DIAGNOSIS — F203 Undifferentiated schizophrenia: Secondary | ICD-10-CM

## 2017-07-13 DIAGNOSIS — N870 Mild cervical dysplasia: Secondary | ICD-10-CM | POA: Diagnosis not present

## 2017-07-13 MED ORDER — VITAFOL GUMMIES 3.33-0.333-34.8 MG PO CHEW: 3.0000 | CHEWABLE_TABLET | Freq: Every day | ORAL | 12 refills | Status: DC
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
## 2017-07-14 LAB — CERVICOVAGINAL ANCILLARY ONLY
Chlamydia: NEGATIVE
Neisseria Gonorrhea: NEGATIVE

## 2017-07-14 NOTE — Addendum Note (Signed)
Addended by: Tamela Oddi on: 07/14/2017 08:15 AM   Modules accepted: Orders

## 2017-07-15 ENCOUNTER — Other Ambulatory Visit: Payer: Self-pay | Admitting: Certified Nurse Midwife

## 2017-07-15 DIAGNOSIS — O2342 Unspecified infection of urinary tract in pregnancy, second trimester: Secondary | ICD-10-CM

## 2017-07-15 LAB — OBSTETRIC PANEL, INCLUDING HIV
ANTIBODY SCREEN: NEGATIVE
BASOS: 0 %
Basophils Absolute: 0 10*3/uL (ref 0.0–0.2)
EOS (ABSOLUTE): 0.2 10*3/uL (ref 0.0–0.4)
EOS: 1 %
HEMATOCRIT: 35.5 % (ref 34.0–46.6)
HEMOGLOBIN: 11.8 g/dL (ref 11.1–15.9)
HIV SCREEN 4TH GENERATION: NONREACTIVE
Hepatitis B Surface Ag: NEGATIVE
IMMATURE GRANS (ABS): 0.1 10*3/uL (ref 0.0–0.1)
Immature Granulocytes: 1 %
LYMPHS: 18 %
Lymphocytes Absolute: 2.5 10*3/uL (ref 0.7–3.1)
MCH: 30.4 pg (ref 26.6–33.0)
MCHC: 33.2 g/dL (ref 31.5–35.7)
MCV: 92 fL (ref 79–97)
Monocytes Absolute: 0.6 10*3/uL (ref 0.1–0.9)
Monocytes: 4 %
NEUTROS ABS: 10.5 10*3/uL — AB (ref 1.4–7.0)
Neutrophils: 76 %
Platelets: 285 10*3/uL (ref 150–450)
RBC: 3.88 x10E6/uL (ref 3.77–5.28)
RDW: 13.2 % (ref 12.3–15.4)
RH TYPE: POSITIVE
RPR Ser Ql: NONREACTIVE
WBC: 13.9 10*3/uL — ABNORMAL HIGH (ref 3.4–10.8)

## 2017-07-15 LAB — HEMOGLOBINOPATHY EVALUATION
HEMOGLOBIN A2 QUANTITATION: 2.2 % (ref 1.8–3.2)
HEMOGLOBIN F QUANTITATION: 0 % (ref 0.0–2.0)
HGB C: 0 %
HGB S: 0 %
HGB VARIANT: 0 %
Hgb A: 97.8 % (ref 96.4–98.8)

## 2017-07-15 LAB — HEMOGLOBIN A1C
Est. average glucose Bld gHb Est-mCnc: 105 mg/dL
HEMOGLOBIN A1C: 5.3 % (ref 4.8–5.6)

## 2017-07-15 LAB — URINE CULTURE, OB REFLEX

## 2017-07-15 LAB — VITAMIN D 25 HYDROXY (VIT D DEFICIENCY, FRACTURES): Vit D, 25-Hydroxy: 27.5 ng/mL — ABNORMAL LOW (ref 30.0–100.0)

## 2017-07-15 LAB — CULTURE, OB URINE

## 2017-07-15 MED ORDER — NITROFURANTOIN MONOHYD MACRO 100 MG PO CAPS: 100.0000 mg | ORAL_CAPSULE | Freq: Two times a day (BID) | ORAL | 0 refills | Status: DC

## 2017-07-17 ENCOUNTER — Ambulatory Visit (HOSPITAL_COMMUNITY)
Admission: EM | Admit: 2017-07-17 | Discharge: 2017-07-17 | Disposition: A | Discharge status: Home / Self Care | Payer: Medicaid Other | Attending: Family Medicine | Admitting: Family Medicine

## 2017-07-17 ENCOUNTER — Encounter (HOSPITAL_COMMUNITY): Payer: Self-pay | Admitting: Emergency Medicine

## 2017-07-17 DIAGNOSIS — L239 Allergic contact dermatitis, unspecified cause: Secondary | ICD-10-CM

## 2017-07-17 MED ORDER — HYDROCORTISONE 0.5 % EX CREA: 1.0000 "application " | TOPICAL_CREAM | Freq: Two times a day (BID) | CUTANEOUS | 0 refills | Status: DC

## 2017-07-17 MED ORDER — TERBINAFINE HCL 1 % EX CREA: 1.0000 "application " | TOPICAL_CREAM | Freq: Two times a day (BID) | CUTANEOUS | 0 refills | Status: DC

## 2017-07-17 NOTE — ED Provider Notes (Signed)
[place]    CSN: [id number] Arrival date & time: 07/17/17  1234     History   Chief Complaint Chief Complaint  Patient presents with  . Rash    HPI Beverly Torres is a 22 y.o. female.  Rash on face and neck has been there 1 or 2 days.  It is pruritic.  There is also a rash on her left shoulder that is not the same with a circular appearance   HPI  Past Medical History:  Diagnosis Date  . Acid reflux   . Anxiety   . Attention deficit disorder   . Chronic chest pain   . Depression     Patient Active Problem List   Diagnosis Date Noted  . Smoking (tobacco) complicating pregnancy, second trimester 07/13/2017  . Morbid obesity ([place]) 07/13/2017  . Supervision of normal pregnancy, antepartum 07/12/2017  . 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 Anxiety disorder 08/21/2016  . Anxiety 05/02/2012  . ADHD (attention deficit hyperactivity disorder), combined type 03/28/2012    Past Surgical History:  Procedure Laterality Date  . ADENOIDECTOMY    . TONSILLECTOMY    . WISDOM TOOTH EXTRACTION      OB History    Gravida  1   Para      Term      Preterm      AB      Living        SAB      TAB      Ectopic      Multiple      Live Births               Home Medications    Prior to Admission medications   Medication Sig Start Date End Date Taking? Authorizing Provider  nitrofurantoin, macrocrystal-monohydrate, (MACROBID) 100 MG capsule Take 1 capsule (100 mg total) by mouth 2 (two) times daily. 07/15/17  Yes Denney, Rachelle A, CNM  Prenatal Multivit-Min-Fe-FA (PRENATAL VITAMINS) 0.8 MG tablet Take 1 tablet by mouth daily. 05/12/17   Donnamae Jude, MD  Prenatal Vit-Fe Phos-FA-Omega (VITAFOL GUMMIES) 3.33-0.333-34.8 MG CHEW Chew 3 tablets by mouth at bedtime. 07/13/17   Morene Crocker, CNM    Family History Family History  Problem Relation Age of Onset  . Anxiety disorder Mother   . Drug abuse Father   . ADD / ADHD  Father   . ADD / ADHD Sister   . Anxiety disorder Sister   . ADD / ADHD Brother   . ODD Brother   . Rheum arthritis Maternal Grandmother   . Kidney failure Maternal Grandmother   . Hypertension Maternal Grandfather   . Diabetes Maternal Grandfather   . Arthritis Maternal Grandfather   . Cancer Maternal Grandfather        Melanoma  . Diabetes Paternal Grandfather     Social History Social History   Tobacco Use  . Smoking status: Current Every Day Smoker    Packs/day: 0.25    Types: Cigarettes  . Smokeless tobacco: Never Used  Substance Use Topics  . Alcohol use: No  . Drug use: No     Allergies   Strawberry (diagnostic)   Review of Systems Review of Systems  Constitutional: Negative for chills and fever.  HENT: Negative for ear pain and sore throat.   Eyes: Negative for pain and visual disturbance.  Respiratory: Negative for cough and shortness of breath.   Cardiovascular: Negative for chest pain and palpitations.  Gastrointestinal: Negative  for abdominal pain and vomiting.  Genitourinary: Negative for dysuria and hematuria.  Musculoskeletal: Negative for arthralgias and back pain.  Skin: Positive for rash. Negative for color change.  Neurological: Negative for seizures and syncope.  All other systems reviewed and are negative.    Physical Exam Triage Vital Signs ED Triage Vitals  Enc Vitals Group     BP 07/17/17 1305 (!) 131/48     Pulse Rate 07/17/17 1305 90     Resp 07/17/17 1305 18     Temp 07/17/17 1305 98.6 F (37 C)     Temp Source 07/17/17 1305 Oral     SpO2 07/17/17 1305 98 %     Weight --      Height --      Head Circumference --      Peak Flow --      Pain Score 07/17/17 1312 0     Pain Loc --      Pain Edu? --      Excl. in [place]? --    No data found.  Updated Vital Signs BP (!) 131/48 (BP Location: Right Arm)   Pulse 90   Temp 98.6 F (37 C) (Oral)   Resp 18   LMP 04/08/2017 (Exact Date)   SpO2 98%   Visual Acuity Right Eye  Distance:   Left Eye Distance:   Bilateral Distance:    Right Eye Near:   Left Eye Near:    Bilateral Near:     Physical Exam  Constitutional: She appears well-developed and well-nourished.  Skin:  Rash on face has the appearance of some sort of contact dermatitis.  It is maculopapular erythematous. Rash on left shoulder consistent with ringworm.     UC Treatments / Results  Labs (all labs ordered are listed, but only abnormal results are displayed) Labs Reviewed - No data to display  EKG None  Radiology No results found.  Procedures Procedures (including critical care time)  Medications Ordered in UC Medications - No data to display  Initial Impression / Assessment and Plan / UC Course  I have reviewed the triage vital signs and the nursing notes.  Pertinent labs & imaging results that were available during my care of the patient were reviewed by me and considered in my medical decision making (see chart for details).    Contact dermatitis and tinea corporis.  Patient is pregnant.  I think topical creams are appropriate given her pregnancy and should help relieve symptoms Final Clinical Impressions(s) / UC Diagnoses   Final diagnoses:  None   Discharge Instructions   None    ED Prescriptions    None     Controlled Substance Prescriptions  Controlled Substance Registry consulted? No   Wardell Honour, MD 07/17/17 1354

## 2017-07-17 NOTE — Discharge Instructions (Signed)
May use 1% hydrocortisone on face up to 3 times a day May use Lamisil cream apply once daily after bathing

## 2017-07-17 NOTE — ED Triage Notes (Signed)
Pt noticed rash on face and L shoulder this morning.

## 2017-07-18 ENCOUNTER — Inpatient Hospital Stay (HOSPITAL_COMMUNITY)
Admission: AD | Admit: 2017-07-18 | Discharge: 2017-07-19 | Disposition: A | Discharge status: Home / Self Care | Payer: Medicaid Other | Source: Ambulatory Visit | Attending: Obstetrics and Gynecology | Admitting: Obstetrics and Gynecology

## 2017-07-18 ENCOUNTER — Other Ambulatory Visit: Payer: Self-pay | Admitting: Certified Nurse Midwife

## 2017-07-18 DIAGNOSIS — Z3400 Encounter for supervision of normal first pregnancy, unspecified trimester: Secondary | ICD-10-CM

## 2017-07-18 DIAGNOSIS — Z2839 Other underimmunization status: Secondary | ICD-10-CM

## 2017-07-18 DIAGNOSIS — O09899 Supervision of other high risk pregnancies, unspecified trimester: Secondary | ICD-10-CM | POA: Insufficient documentation

## 2017-07-18 DIAGNOSIS — Z283 Underimmunization status: Secondary | ICD-10-CM

## 2017-07-18 DIAGNOSIS — O26892 Other specified pregnancy related conditions, second trimester: Secondary | ICD-10-CM | POA: Insufficient documentation

## 2017-07-18 DIAGNOSIS — B373 Candidiasis of vulva and vagina: Secondary | ICD-10-CM

## 2017-07-18 DIAGNOSIS — O99332 Smoking (tobacco) complicating pregnancy, second trimester: Secondary | ICD-10-CM | POA: Insufficient documentation

## 2017-07-18 DIAGNOSIS — E559 Vitamin D deficiency, unspecified: Secondary | ICD-10-CM

## 2017-07-18 DIAGNOSIS — Z3A14 14 weeks gestation of pregnancy: Secondary | ICD-10-CM | POA: Insufficient documentation

## 2017-07-18 DIAGNOSIS — O26899 Other specified pregnancy related conditions, unspecified trimester: Secondary | ICD-10-CM

## 2017-07-18 DIAGNOSIS — F17210 Nicotine dependence, cigarettes, uncomplicated: Secondary | ICD-10-CM | POA: Insufficient documentation

## 2017-07-18 DIAGNOSIS — B3731 Acute candidiasis of vulva and vagina: Secondary | ICD-10-CM

## 2017-07-18 DIAGNOSIS — Z87440 Personal history of urinary (tract) infections: Secondary | ICD-10-CM

## 2017-07-18 DIAGNOSIS — R87612 Low grade squamous intraepithelial lesion on cytologic smear of cervix (LGSIL): Secondary | ICD-10-CM

## 2017-07-18 DIAGNOSIS — O9989 Other specified diseases and conditions complicating pregnancy, childbirth and the puerperium: Principal | ICD-10-CM

## 2017-07-18 DIAGNOSIS — O3440 Maternal care for other abnormalities of cervix, unspecified trimester: Secondary | ICD-10-CM

## 2017-07-18 DIAGNOSIS — R102 Pelvic and perineal pain: Secondary | ICD-10-CM | POA: Insufficient documentation

## 2017-07-18 LAB — CYTOLOGY - PAP

## 2017-07-18 MED ORDER — VITAMIN D (ERGOCALCIFEROL) 1.25 MG (50000 UNIT) PO CAPS: 50000.0000 [IU] | ORAL_CAPSULE | ORAL | 2 refills | Status: DC

## 2017-07-18 MED ORDER — TERCONAZOLE 0.8 % VA CREA: 1.0000 | TOPICAL_CREAM | Freq: Every day | VAGINAL | 0 refills | Status: DC

## 2017-07-18 MED ORDER — FLUCONAZOLE 150 MG PO TABS: 150.0000 mg | ORAL_TABLET | Freq: Once | ORAL | 0 refills | Status: DC

## 2017-07-19 ENCOUNTER — Encounter (HOSPITAL_COMMUNITY): Payer: Self-pay | Admitting: *Deleted

## 2017-07-19 DIAGNOSIS — Z3A11 11 weeks gestation of pregnancy: Secondary | ICD-10-CM

## 2017-07-19 DIAGNOSIS — R102 Pelvic and perineal pain: Secondary | ICD-10-CM

## 2017-07-19 DIAGNOSIS — F17210 Nicotine dependence, cigarettes, uncomplicated: Secondary | ICD-10-CM | POA: Diagnosis not present

## 2017-07-19 DIAGNOSIS — O99332 Smoking (tobacco) complicating pregnancy, second trimester: Secondary | ICD-10-CM | POA: Diagnosis not present

## 2017-07-19 DIAGNOSIS — O26891 Other specified pregnancy related conditions, first trimester: Secondary | ICD-10-CM

## 2017-07-19 DIAGNOSIS — M549 Dorsalgia, unspecified: Secondary | ICD-10-CM | POA: Diagnosis present

## 2017-07-19 DIAGNOSIS — O26892 Other specified pregnancy related conditions, second trimester: Secondary | ICD-10-CM | POA: Diagnosis not present

## 2017-07-19 DIAGNOSIS — Z3A14 14 weeks gestation of pregnancy: Secondary | ICD-10-CM | POA: Diagnosis not present

## 2017-07-19 LAB — URINALYSIS, ROUTINE W REFLEX MICROSCOPIC
Bilirubin Urine: NEGATIVE
GLUCOSE, UA: NEGATIVE mg/dL
Hgb urine dipstick: NEGATIVE
KETONES UR: 20 mg/dL — AB
NITRITE: NEGATIVE
PROTEIN: NEGATIVE mg/dL
Specific Gravity, Urine: 1.026 (ref 1.005–1.030)
pH: 6 (ref 5.0–8.0)

## 2017-07-19 MED ORDER — ACETAMINOPHEN 500 MG PO TABS
1000.0000 mg | ORAL_TABLET | Freq: Once | ORAL | Status: AC
  Administered 2017-07-19: 1000 mg via ORAL
  Filled 2017-07-19: qty 2

## 2017-07-19 MED ORDER — COMFORT FIT MATERNITY SUPP LG MISC: 1.0000 [IU] | Freq: Every day | 0 refills | Status: DC

## 2017-07-19 NOTE — MAU Note (Signed)
PT SAYS  SHE WOKE 10PM-  SHE SAYS  LOWER BACK HURTS TO MOVE -  AND MID- ABD HURTS .  FIRST TIME  EVER HAPPENED.  [place].   TOOK TYLENOL- 2 TABS-  AT 10PM- NO RELIEF. NOW  FEELS LIGHT HEADED

## 2017-07-19 NOTE — MAU Provider Note (Signed)
Chief Complaint: Abdominal Pain   First Provider Initiated Contact with Patient 07/19/17 0300      SUBJECTIVE HPI: Beverly Torres is a 22 y.o. G1P0 at [redacted]w[redacted]d by LMP who presents to maternity admissions reporting UTI pain and back pain. She reports being treated for a yeast infection and UTI. Started medication yesterday for infections. She reports back pain and abdominal started at 2200 tonight- describes abdominal pain as sharp stabbing pain in her lower pelvis and describes back pain as lower back ache. She reports sharp pain gets worse with movement.  She reports having back pain prior to getting pregnant- rates pain 5/10, took  500mg  of Tylenol at 2200 with no relief. She reports that her back pain hurts to move and continued urinary symptoms of dysuria and frequency. She denies vaginal bleeding, vaginal itching/burning, h/a, dizziness, n/v, or fever/chills.    Past Medical History:  Diagnosis Date  . Acid reflux   . Anxiety   . Attention deficit disorder   . Chronic chest pain   . Depression    Past Surgical History:  Procedure Laterality Date  . ADENOIDECTOMY    . TONSILLECTOMY    . WISDOM TOOTH EXTRACTION     Social History   Socioeconomic History  . Marital status: Single    Spouse name: Not on file  . Number of children: Not on file  . Years of education: Not on file  . Highest education level: Not on file  Occupational History  . Not on file  Social Needs  . Financial resource strain: Not on file  . Food insecurity:    Worry: Not on file    Inability: Not on file  . Transportation needs:    Medical: Not on file    Non-medical: Not on file  Tobacco Use  . Smoking status: Current Every Day Smoker    Packs/day: 0.25    Types: Cigarettes  . Smokeless tobacco: Never Used  Substance and Sexual Activity  . Alcohol use: No  . Drug use: No  . Sexual activity: Yes    Birth control/protection: None  Lifestyle  . Physical activity:    Days per week: Not on file   Minutes per session: Not on file  . Stress: Not on file  Relationships  . Social connections:    Talks on phone: Not on file    Gets together: Not on file    Attends religious service: Not on file    Active member of club or organization: Not on file    Attends meetings of clubs or organizations: Not on file    Relationship status: Not on file  . Intimate partner violence:    Fear of current or ex partner: Not on file    Emotionally abused: Not on file    Physically abused: Not on file    Forced sexual activity: Not on file  Other Topics Concern  . Not on file  Social History Narrative  . Not on file   No current facility-administered medications on file prior to encounter.    Current Outpatient Medications on File Prior to Encounter  Medication Sig Dispense Refill  . hydrocortisone cream 0.5 % Apply 1 application topically 2 (two) times daily. 30 g 0  . nitrofurantoin, macrocrystal-monohydrate, (MACROBID) 100 MG capsule Take 1 capsule (100 mg total) by mouth 2 (two) times daily. 14 capsule 0  . Prenatal Multivit-Min-Fe-FA (PRENATAL VITAMINS) 0.8 MG tablet Take 1 tablet by mouth daily. 30 tablet 12  .  Prenatal Vit-Fe Phos-FA-Omega (VITAFOL GUMMIES) 3.33-0.333-34.8 MG CHEW Chew 3 tablets by mouth at bedtime. 90 tablet 12  . terbinafine (LAMISIL AT) 1 % cream Apply 1 application topically 2 (two) times daily. 30 g 0  . terconazole (TERAZOL 3) 0.8 % vaginal cream Place 1 applicator vaginally at bedtime. 20 g 0  . Vitamin D, Ergocalciferol, (DRISDOL) 50000 units CAPS capsule Take 1 capsule (50,000 Units total) by mouth every 7 (seven) days. 30 capsule 2   Allergies  Allergen Reactions  . Strawberry (Diagnostic) Itching    ROS:  Review of Systems  Respiratory: Negative.   Cardiovascular: Negative.   Gastrointestinal: Positive for abdominal pain. Negative for constipation, diarrhea, nausea and vomiting.  Genitourinary: Positive for dysuria and frequency. Negative for difficulty  urinating, flank pain, pelvic pain, urgency, vaginal bleeding and vaginal discharge.  Musculoskeletal: Positive for back pain.   I have reviewed patient's Past Medical Hx, Surgical Hx, Family Hx, Social Hx, medications and allergies.   Physical Exam   Vitals:   07/19/17 0021 07/19/17 0345  BP: 109/71 (!) 104/56  Pulse: 98 93  Resp: 20 17  Temp: 97.8 F (36.6 C) 98.2 F (36.8 C)  TempSrc: Oral Oral  SpO2:  100%  Weight: 220 lb 12 oz (100.1 kg)   Height: 5\' 2"  (1.575 m)    Constitutional: Well-developed, obese female in no acute distress.  Cardiovascular: normal rate Respiratory: normal effort GI: Abd soft, tender in suprapubic and pelvic region. Pos BS x 4 MS: Extremities nontender, no edema, normal ROM Neurologic: Alert and oriented x 4.  GU: Neg CVAT.  CERVICAL EXAM: Dilation: Closed Exam by:: Darrol Poke, CNM  FHT 144 by doppler  LAB RESULTS Results for orders placed or performed during the hospital encounter of 07/18/17 (from the past 24 hour(s))  Urinalysis, Routine w reflex microscopic     Status: Abnormal   Collection Time: 07/19/17 12:23 AM  Result Value Ref Range   Color, Urine YELLOW YELLOW   APPearance CLEAR CLEAR   Specific Gravity, Urine 1.026 1.005 - 1.030   pH 6.0 5.0 - 8.0   Glucose, UA NEGATIVE NEGATIVE mg/dL   Hgb urine dipstick NEGATIVE NEGATIVE   Bilirubin Urine NEGATIVE NEGATIVE   Ketones, ur 20 (A) NEGATIVE mg/dL   Protein, ur NEGATIVE NEGATIVE mg/dL   Nitrite NEGATIVE NEGATIVE   Leukocytes, UA TRACE (A) NEGATIVE   RBC / HPF 0-5 0 - 5 RBC/hpf   WBC, UA 0-5 0 - 5 WBC/hpf   Bacteria, UA RARE (A) NONE SEEN   Squamous Epithelial / LPF 0-5 0 - 5   Mucus PRESENT     A/Positive/-- (06/05 1437)  MAU Management/MDM: Orders Placed This Encounter  Procedures  . Culture, OB Urine  . Urinalysis, Routine w reflex microscopic  . Discharge patient Discharge disposition: 01-Home or Self Care; Discharge patient date: 07/19/2017   UA- negative  for nitrates Urine culture pending   Meds ordered this encounter  Medications  . acetaminophen (TYLENOL) tablet 1,000 mg  . Elastic Bandages & Supports (COMFORT FIT MATERNITY SUPP LG) MISC    Sig: 1 Units by Does not apply route daily.    Dispense:  1 each    Refill:  0    Order Specific Question:   Supervising Provider    Answer:   Aletha Halim [[id number]]   Treatments in MAU included 1000mg  of Tylenol for abdominal and back pain- patient reports relief of pain with medication. Educated and discussed use of Tylenol during pregnancy for  pain. Educated patient on continuing medication for yeast infection and UTI as prescribed- will call patient if culture shows medication prescribed does not cover UTI. Patient verbalizes understanding. Pt discharged. Pt stable at time of discharge.   ASSESSMENT 1. Pain of round ligament during pregnancy   2. Supervision of normal first pregnancy, antepartum   3. History of UTI     PLAN Discharge home Rx for maternity support belt  Follow up as scheduled for prenatal appointments Return to MAU as needed  Continue medication prescribed  Continue prenatal vitamins   Follow-up [facility] Follow up.   Specialty:  Obstetrics and Gynecology Why:  Follow up as scheduled for prenatal appointments  Contact information: [address] [place] [phone]          Allergies as of 07/19/2017      Reactions   Strawberry (diagnostic) Itching      Medication List    STOP taking these medications   fluconazole 150 MG tablet Commonly known as:  DIFLUCAN     TAKE these medications   COMFORT FIT MATERNITY SUPP LG Misc 1 Units by Does not apply route daily.   hydrocortisone cream 0.5 % Apply 1 application topically 2 (two) times daily.   nitrofurantoin (macrocrystal-monohydrate) 100 MG capsule Commonly known as:  MACROBID Take 1 capsule (100 mg total) by  mouth 2 (two) times daily.   Prenatal Vitamins 0.8 MG tablet Take 1 tablet by mouth daily.   terbinafine 1 % cream Commonly known as:  LAMISIL AT Apply 1 application topically 2 (two) times daily.   terconazole 0.8 % vaginal cream Commonly known as:  TERAZOL 3 Place 1 applicator vaginally at bedtime.   VITAFOL GUMMIES 3.33-0.333-34.8 MG Chew Chew 3 tablets by mouth at bedtime.   Vitamin D (Ergocalciferol) 50000 units Caps capsule Commonly known as:  DRISDOL Take 1 capsule (50,000 Units total) by mouth every 7 (seven) days.      Darrol Poke  Certified Nurse-Midwife 07/20/2017  2:05 AM

## 2017-07-20 ENCOUNTER — Encounter: Payer: Self-pay | Admitting: Family Medicine

## 2017-07-20 DIAGNOSIS — O99210 Obesity complicating pregnancy, unspecified trimester: Secondary | ICD-10-CM | POA: Insufficient documentation

## 2017-07-20 LAB — CULTURE, OB URINE

## 2017-07-22 ENCOUNTER — Encounter: Payer: Self-pay | Admitting: Certified Nurse Midwife

## 2017-07-22 LAB — INHERITEST CORE(CF97,SMA,FRAX)

## 2017-07-26 ENCOUNTER — Other Ambulatory Visit: Payer: Self-pay | Admitting: Certified Nurse Midwife

## 2017-07-26 DIAGNOSIS — O0990 Supervision of high risk pregnancy, unspecified, unspecified trimester: Secondary | ICD-10-CM

## 2017-08-10 ENCOUNTER — Encounter: Payer: Self-pay | Admitting: Certified Nurse Midwife

## 2017-08-10 ENCOUNTER — Ambulatory Visit (INDEPENDENT_AMBULATORY_CARE_PROVIDER_SITE_OTHER): Payer: Medicaid Other | Admitting: Certified Nurse Midwife

## 2017-08-10 VITALS — BP 120/72 | HR 94 | Wt 220.0 lb

## 2017-08-10 DIAGNOSIS — Z889 Allergy status to unspecified drugs, medicaments and biological substances status: Secondary | ICD-10-CM

## 2017-08-10 DIAGNOSIS — O0990 Supervision of high risk pregnancy, unspecified, unspecified trimester: Secondary | ICD-10-CM

## 2017-08-10 DIAGNOSIS — O9989 Other specified diseases and conditions complicating pregnancy, childbirth and the puerperium: Secondary | ICD-10-CM

## 2017-08-10 DIAGNOSIS — Z2839 Other underimmunization status: Secondary | ICD-10-CM

## 2017-08-10 DIAGNOSIS — Z283 Underimmunization status: Secondary | ICD-10-CM

## 2017-08-10 DIAGNOSIS — O99210 Obesity complicating pregnancy, unspecified trimester: Secondary | ICD-10-CM

## 2017-08-10 NOTE — Progress Notes (Signed)
   PRENATAL VISIT NOTE  Subjective:  Beverly Torres is a 22 y.o. G1P0 at 55w5dbeing seen today for ongoing prenatal care.  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  Patient reports no complaints.  Contractions: Not present. Vag. Bleeding: None.  Movement: Present. Denies leaking of fluid.   The following portions of the patient's history were reviewed and updated as appropriate: allergies, current medications, past family history, past medical history, past social history, past surgical history and problem list. Problem list updated.  Objective:   Vitals:   08/10/17 1415  BP: 120/72  Pulse: 94  Weight: 220 lb (99.8 kg)    Fetal Status:     Movement: Present     General:  Alert, oriented and cooperative. Patient is in no acute distress.  Skin: Skin is warm and dry. No rash noted.   Cardiovascular: Normal heart rate noted  Respiratory: Normal respiratory effort, no problems with respiration noted  Abdomen: Soft, gravid, appropriate for gestational age.  Pain/Pressure: Absent     Pelvic: Cervical exam deferred        Extremities: Normal range of motion.  Edema: Trace  Mental Status: Normal mood and affect. Normal behavior. Normal judgment and thought content.   Assessment and Plan:  Pregnancy: G1P0 at 169w5d1. Multiple allergies      - Ambulatory referral to Allergy  2. Supervision of high risk pregnancy, antepartum     Ed[place]othocore: 2.  - AFP, Serum,  Open Spina Bifida - Culture, OB Urine  3. Rubella non-immune status, antepartum     MMR postpartum  4. Obesity affecting pregnancy, antepartum    1 lb weight gain so far this pregnancy  Preterm labor symptoms and general obstetric precautions including but not limited to vaginal bleeding, contractions, leaking of fluid and fetal movement were reviewed in detail with the patient. Please refer to After Visit Summary for other counseling recommendations.  [facility] in about 1 month (around 09/07/2017) [facility] Future Appointments  Date Time Provider [place]7/17/2019  2:15 PM [place] [facility]  09/07/2017  1:00 PM [place], LiKathie DikeCNM [facility] None    RaMorene CrockerCNM

## 2017-08-12 LAB — URINE CULTURE, OB REFLEX

## 2017-08-12 LAB — CULTURE, OB URINE

## 2017-08-16 LAB — AFP, SERUM, OPEN SPINA BIFIDA
AFP MOM: 0.93
AFP VALUE AFPOSL: 29.9 ng/mL
Gest. Age on Collection Date: 17.7 weeks
MATERNAL AGE AT EDD: 22.9 a
OSBR Risk 1 IN: 10000
Test Results:: NEGATIVE
WEIGHT: 220 [lb_av]

## 2017-08-17 ENCOUNTER — Encounter (HOSPITAL_COMMUNITY): Payer: Self-pay

## 2017-08-18 ENCOUNTER — Other Ambulatory Visit: Payer: Self-pay | Admitting: Certified Nurse Midwife

## 2017-08-18 DIAGNOSIS — O0990 Supervision of high risk pregnancy, unspecified, unspecified trimester: Secondary | ICD-10-CM

## 2017-08-24 ENCOUNTER — Other Ambulatory Visit: Payer: Self-pay | Admitting: Certified Nurse Midwife

## 2017-08-24 ENCOUNTER — Ambulatory Visit (HOSPITAL_COMMUNITY)
Admission: RE | Admit: 2017-08-24 | Discharge: 2017-08-24 | Disposition: A | Discharge status: Home / Self Care | Payer: Medicaid Other | Source: Ambulatory Visit | Attending: Certified Nurse Midwife | Admitting: Certified Nurse Midwife

## 2017-08-24 DIAGNOSIS — Z3A19 19 weeks gestation of pregnancy: Secondary | ICD-10-CM

## 2017-08-24 DIAGNOSIS — O99213 Obesity complicating pregnancy, third trimester: Secondary | ICD-10-CM

## 2017-08-24 DIAGNOSIS — O99332 Smoking (tobacco) complicating pregnancy, second trimester: Secondary | ICD-10-CM

## 2017-08-24 DIAGNOSIS — Z3689 Encounter for other specified antenatal screening: Secondary | ICD-10-CM | POA: Diagnosis not present

## 2017-08-24 DIAGNOSIS — Z3490 Encounter for supervision of normal pregnancy, unspecified, unspecified trimester: Secondary | ICD-10-CM | POA: Diagnosis present

## 2017-08-24 DIAGNOSIS — Z3492 Encounter for supervision of normal pregnancy, unspecified, second trimester: Secondary | ICD-10-CM | POA: Diagnosis not present

## 2017-08-24 DIAGNOSIS — O99212 Obesity complicating pregnancy, second trimester: Secondary | ICD-10-CM

## 2017-08-25 ENCOUNTER — Other Ambulatory Visit (HOSPITAL_COMMUNITY): Payer: Self-pay | Admitting: *Deleted

## 2017-08-25 DIAGNOSIS — Z363 Encounter for antenatal screening for malformations: Secondary | ICD-10-CM

## 2017-09-07 ENCOUNTER — Other Ambulatory Visit: Payer: Self-pay

## 2017-09-07 ENCOUNTER — Ambulatory Visit (INDEPENDENT_AMBULATORY_CARE_PROVIDER_SITE_OTHER): Payer: Medicaid Other | Admitting: Advanced Practice Midwife

## 2017-09-07 VITALS — BP 114/66 | HR 109 | Wt 226.7 lb

## 2017-09-07 DIAGNOSIS — O3440 Maternal care for other abnormalities of cervix, unspecified trimester: Secondary | ICD-10-CM

## 2017-09-07 DIAGNOSIS — R87612 Low grade squamous intraepithelial lesion on cytologic smear of cervix (LGSIL): Secondary | ICD-10-CM

## 2017-09-07 DIAGNOSIS — O0990 Supervision of high risk pregnancy, unspecified, unspecified trimester: Secondary | ICD-10-CM

## 2017-09-07 DIAGNOSIS — O219 Vomiting of pregnancy, unspecified: Secondary | ICD-10-CM

## 2017-09-07 MED ORDER — DOXYLAMINE-PYRIDOXINE 10-10 MG PO TBEC: DELAYED_RELEASE_TABLET | ORAL | 5 refills | Status: DC

## 2017-09-07 NOTE — Progress Notes (Signed)
ROB.  C/o NV x 4 weeks.

## 2017-09-07 NOTE — Patient Instructions (Signed)
Morning Sickness °Morning sickness is when you feel sick to your stomach (nauseous) during pregnancy. This nauseous feeling may or may not come with vomiting. It often occurs in the morning but can be a problem any time of day. Morning sickness is most common during the first trimester, but it may continue throughout pregnancy. While morning sickness is unpleasant, it is usually harmless unless you develop severe and continual vomiting (hyperemesis gravidarum). This condition requires more intense treatment. °What are the causes? °The cause of morning sickness is not completely known but seems to be related to normal hormonal changes that occur in pregnancy. °What increases the risk? °You are at greater risk if you: °· Experienced nausea or vomiting before your pregnancy. °· Had morning sickness during a previous pregnancy. °· Are pregnant with more than one baby, such as twins. ° °How is this treated? °Do not use any medicines (prescription, over-the-counter, or herbal) for morning sickness without first talking to your health care provider. Your health care provider may prescribe or recommend: °· Vitamin B6 supplements. °· Anti-nausea medicines. °· The herbal medicine ginger. ° °Follow these instructions at home: °· Only take over-the-counter or prescription medicines as directed by your health care provider. °· Taking multivitamins before getting pregnant can prevent or decrease the severity of morning sickness in most women. °· Eat a piece of dry toast or unsalted crackers before getting out of bed in the morning. °· Eat five or six small meals a day. °· Eat dry and bland foods (rice, baked potato). Foods high in carbohydrates are often helpful. °· Do not drink liquids with your meals. Drink liquids between meals. °· Avoid greasy, fatty, and spicy foods. °· Get someone to cook for you if the smell of any food causes nausea and vomiting. °· If you feel nauseous after taking prenatal vitamins, take the vitamins at  night or with a snack. °· Snack on protein foods (nuts, yogurt, cheese) between meals if you are hungry. °· Eat unsweetened gelatins for desserts. °· Wearing an acupressure wristband (worn for sea sickness) may be helpful. °· Acupuncture may be helpful. °· Do not smoke. °· Get a humidifier to keep the air in your house free of odors. °· Get plenty of fresh air. °Contact a health care provider if: °· Your home remedies are not working, and you need medicine. °· You feel dizzy or lightheaded. °· You are losing weight. °Get help right away if: °· You have persistent and uncontrolled nausea and vomiting. °· You pass out (faint). °This information is not intended to replace advice given to you by your health care provider. Make sure you discuss any questions you have with your health care provider. °Document Released: 03/18/2006 Document Revised: 07/03/2015 Document Reviewed: 07/12/2012 °Elsevier Interactive Patient Education © 2017 [employer] Inc. ° °

## 2017-09-07 NOTE — Progress Notes (Signed)
   PRENATAL VISIT NOTE  Subjective:  Beverly Torres is a 22 y.o. G1P0 at [redacted]w[redacted]d being seen today for ongoing prenatal care.  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  Patient reports daily nausea with frequent vomiting.  Contractions: Not present. Vag. Bleeding: None.  Movement: Present. Denies leaking of fluid.   The following portions of the patient's history were reviewed and updated as appropriate: allergies, current medications, past family history, past medical history, past social history, past surgical history and problem list. Problem list updated.  Objective:   Vitals:   09/07/17 1314  BP: 114/66  Pulse: (!) 109  Weight: 226 lb 11.2 oz (102.8 kg)    Fetal Status:     Movement: Present     General:  Alert, oriented and cooperative. Patient is in no acute distress.  Skin: Skin is warm and dry. No rash noted.   Cardiovascular: Normal heart rate noted  Respiratory: Normal respiratory effort, no problems with respiration noted  Abdomen: Soft, gravid, appropriate for gestational age.  Pain/Pressure: Absent     Pelvic: Cervical exam deferred        Extremities: Normal range of motion.  Edema: Trace  Mental Status: Normal mood and affect. Normal behavior. Normal judgment and thought content.   Assessment and Plan:  Pregnancy: G1P0 at [redacted]w[redacted]d  1. Supervision of high risk pregnancy, antepartum --Anticipatory guidance about next visits/weeks of pregnancy  given.  2. 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 Discussed options with pt and pt prefers colpo PP.  3. N/V of pregnancy --Discussed dietary changes, small, frequent, bland foods.   --Treat acid reflux with OTC Pepcid or Zantac PRN --Rx for Diclegis sent to pharmacy  Preterm labor symptoms and general obstetric precautions including but not limited to vaginal bleeding, contractions, leaking of fluid and fetal movement were reviewed in detail with the patient. Please refer to After Visit Summary for other counseling recommendations.  No follow-ups on file.  Future Appointments  Date Time Provider [place]  09/20/2017  1:30 PM Bobbitt, [place] Muta, MD [facility] None  09/21/2017  2:15 PM [place]    Fatima Blank, CNM

## 2017-09-20 ENCOUNTER — Ambulatory Visit: Payer: Self-pay | Admitting: Allergy and Immunology

## 2017-09-21 ENCOUNTER — Ambulatory Visit (HOSPITAL_COMMUNITY)
Admission: RE | Admit: 2017-09-21 | Discharge: 2017-09-21 | Disposition: A | Discharge status: Home / Self Care | Payer: Medicaid Other | Source: Ambulatory Visit | Attending: Advanced Practice Midwife | Admitting: Advanced Practice Midwife

## 2017-09-21 DIAGNOSIS — Z3A23 23 weeks gestation of pregnancy: Secondary | ICD-10-CM | POA: Insufficient documentation

## 2017-09-21 DIAGNOSIS — O99212 Obesity complicating pregnancy, second trimester: Secondary | ICD-10-CM

## 2017-09-21 DIAGNOSIS — O99332 Smoking (tobacco) complicating pregnancy, second trimester: Secondary | ICD-10-CM

## 2017-09-21 DIAGNOSIS — Z363 Encounter for antenatal screening for malformations: Secondary | ICD-10-CM | POA: Diagnosis not present

## 2017-10-05 ENCOUNTER — Other Ambulatory Visit: Payer: Self-pay

## 2017-10-05 ENCOUNTER — Encounter: Payer: Self-pay | Admitting: Obstetrics and Gynecology

## 2017-10-05 ENCOUNTER — Ambulatory Visit (INDEPENDENT_AMBULATORY_CARE_PROVIDER_SITE_OTHER): Payer: Medicaid Other | Admitting: Obstetrics and Gynecology

## 2017-10-05 VITALS — BP 117/67 | HR 108 | Wt 229.9 lb

## 2017-10-05 DIAGNOSIS — O3442 Maternal care for other abnormalities of cervix, second trimester: Secondary | ICD-10-CM

## 2017-10-05 DIAGNOSIS — Z23 Encounter for immunization: Secondary | ICD-10-CM

## 2017-10-05 DIAGNOSIS — O0990 Supervision of high risk pregnancy, unspecified, unspecified trimester: Secondary | ICD-10-CM

## 2017-10-05 DIAGNOSIS — R87612 Low grade squamous intraepithelial lesion on cytologic smear of cervix (LGSIL): Secondary | ICD-10-CM

## 2017-10-05 DIAGNOSIS — O0992 Supervision of high risk pregnancy, unspecified, second trimester: Secondary | ICD-10-CM

## 2017-10-05 DIAGNOSIS — O3440 Maternal care for other abnormalities of cervix, unspecified trimester: Secondary | ICD-10-CM

## 2017-10-05 NOTE — Patient Instructions (Signed)

## 2017-10-05 NOTE — Progress Notes (Signed)
ROB.  FLU vaccine given right deltoid, tolerated well.

## 2017-10-05 NOTE — Progress Notes (Signed)
Subjective:  Beverly Torres is a 22 y.o. G1P0 at [redacted]w[redacted]d being seen today for ongoing prenatal care.  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  Patient reports no complaints.  Contractions: Not present. Vag. Bleeding: None.  Movement: Present. Denies leaking of fluid.   The following portions of the patient's history were reviewed and updated as appropriate: allergies, current medications, past family history, past medical history, past social history, past surgical history and problem list. Problem list updated.  Objective:   Vitals:   10/05/17 1440  BP: 117/67  Pulse: (!) 108  Weight: 229 lb 14.4 oz (104.3 kg)    Fetal Status: Fetal Heart Rate (bpm): 151   Movement: Present     General:  Alert, oriented and cooperative. Patient is in no acute distress.  Skin: Skin is warm and dry. No rash noted.   Cardiovascular: Normal heart rate noted  Respiratory: Normal respiratory effort, no problems with respiration noted  Abdomen: Soft, gravid, appropriate for gestational age. Pain/Pressure: Absent     Pelvic:  Cervical exam deferred        Extremities: Normal range of motion.  Edema: Trace  Mental Status: Normal mood and affect. Normal behavior. Normal judgment and thought content.   Urinalysis:      Assessment and Plan:  Pregnancy: G1P0 at [redacted]w[redacted]d  1. Supervision of high risk pregnancy, antepartum Stable Glucola next visit - Flu Vaccine QUAD 36+ mos IM (Fluarix, Quad  PF)  2. Need for immunization against influenza - Flu Vaccine QUAD 36+ mos IM  3. 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 Please refer to After Visit Summary for other counseling recommendations.  Return in about 3 weeks (around 10/26/2017) for OB visit.   Chancy Milroy, MD

## 2017-10-13 ENCOUNTER — Ambulatory Visit (INDEPENDENT_AMBULATORY_CARE_PROVIDER_SITE_OTHER): Payer: Medicaid Other | Admitting: Allergy & Immunology

## 2017-10-13 ENCOUNTER — Encounter: Payer: Self-pay | Admitting: Allergy & Immunology

## 2017-10-13 VITALS — BP 108/60 | HR 107 | Resp 16 | Ht 62.0 in | Wt 229.0 lb

## 2017-10-13 DIAGNOSIS — T7840XD Allergy, unspecified, subsequent encounter: Secondary | ICD-10-CM | POA: Diagnosis not present

## 2017-10-13 DIAGNOSIS — J302 Other seasonal allergic rhinitis: Secondary | ICD-10-CM | POA: Diagnosis not present

## 2017-10-13 NOTE — Progress Notes (Signed)
NEW PATIENT  Date of Service/Encounter:  10/13/17  Referring provider: Marliss Coots, NP   Assessment:   Seasonal allergic rhinitis  Allergic reaction - unknown trigger   Beverly Torres presents for an evaluation of allergies.  Apparently, it was recommended that she see an allergist following a reaction which ended up in the urgent care.  In urgent care, she was treated with antihistamines with quick improvement in her symptoms per the patient.  There is no known food exposure, and she tolerates all of the major food allergens without adverse event.  She does report hives with strawberries, so we will get a strawberry IgE to evaluate for this.  We will also get an alpha gal panel since alpha gal can present with delayed reactions to red meats.  Plan/Recommendations:   1. Seasonal allergic rhinitis - with recent allergic reaction - We will get some labs to look for environmental allergies, alpha-gal syndrome (red meat allergy), and strawberry allergy.  - In the meantime, I would add on Zyrtec (cetirizine) 10mg  as needed for allergic reactions. - Cetirizine is similar to Benadryl, but lasts a lot long. - Zyrtec (cetirizine) and Claritin (loratadine) are safe to use during pregnancy.   2. Return in about 6 months (around 04/13/2018). Bring the baby to your next appointment!    Subjective:   Beverly Torres is a 22 y.o. female presenting today for evaluation of  Chief Complaint  Patient presents with  . Rash    possible cat   . Angioedema  . Hypotension    META KROENKE has a history of the following: Patient Active Problem List   Diagnosis Date Noted  . Obesity affecting pregnancy, antepartum 07/20/2017  . Rubella non-immune status, antepartum 07/18/2017  . Vitamin D deficiency 07/18/2017  . 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 Smoking (tobacco) complicating pregnancy, second trimester 07/13/2017  . Morbid  obesity ([place]) 07/13/2017  . Supervision of high risk pregnancy, antepartum 07/12/2017  . 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 Anxiety disorder 08/21/2016  . Anxiety 05/02/2012  . ADHD (attention deficit hyperactivity disorder), combined type 03/28/2012    History obtained from: chart review and patient and her sister.  Beverly Torres was referred by Marliss Coots, NP.     Beverly Torres is a 22 y.o. female presenting for allergic rhinitis. She feels that she is allergic to cats. She reports fatigue and headache. She is currently pregnant and her next appointment in 10/27/17. She does endorse some light headedness.   She went to the Urgent Care and was having an allergic reaction. She had a rash on her face and she was swollen and itchy. This was felt to be secondary to cats per the patient, although she has had a cat for the majority of her life. Review of the note from June 2019 shows that she was diagnosed with contact dermatitis and tinea corporis, but there is no mention of the cat. I assume that they told her that the tinea could come from the cat. Overall, the patient seems unconcerned today.   She has seasonal allergies. But she has not been paying attention to how often they are occurring since she has been pregnant. She will take a Benadryl as needed, but does not think that she requires much else. The cat itself certainly has never seemed to bother her and she denies problems with exposures to dogs.   She tolerates all of the major food allergens without  adverse event. She does eat beef 3-4 times per week. Otherwise, there is no history of other atopic diseases, including asthma, drug allergies, stinging insect allergies, eczema, or urticaria . There is no significant infectious history. Vaccinations are up to date.    Past Medical History: Patient Active Problem List   Diagnosis Date Noted  . Obesity affecting pregnancy, antepartum 07/20/2017  . Rubella non-immune  status, antepartum 07/18/2017  . Vitamin D deficiency 07/18/2017  . 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 Smoking (tobacco) complicating pregnancy, second trimester 07/13/2017  . Morbid obesity ([place]) 07/13/2017  . Supervision of high risk pregnancy, antepartum 07/12/2017  . 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 Anxiety disorder 08/21/2016  . Anxiety 05/02/2012  . ADHD (attention deficit hyperactivity disorder), combined type 03/28/2012    Medication List:  Allergies as of 10/13/2017      Reactions   Strawberry (diagnostic) Itching      Medication List        Accurate as of 10/13/17 11:59 PM. Always use your most recent med list.          COMFORT FIT MATERNITY SUPP LG Misc 1 Units by Does not apply route daily.   VITAFOL GUMMIES 3.33-0.333-34.8 MG Chew Chew 3 tablets by mouth at bedtime.   Vitamin D (Ergocalciferol) 50000 units Caps capsule Commonly known as:  DRISDOL Take 1 capsule (50,000 Units total) by mouth every 7 (seven) days.       Birth History: non-contributory  Developmental History: non-contributory  Past Surgical History: Past Surgical History:  Procedure Laterality Date  . ADENOIDECTOMY    . TONSILLECTOMY    . WISDOM TOOTH EXTRACTION       Family History: Family History  Problem Relation Age of Onset  . Anxiety disorder Mother   . Drug abuse Father   . ADD / ADHD Father   . ADD / ADHD Sister   . Anxiety disorder Sister   . ADD / ADHD Brother   . ODD Brother   . Rheum arthritis Maternal Grandmother   . Kidney failure Maternal Grandmother   . Hypertension Maternal Grandfather   . Diabetes Maternal Grandfather   . Arthritis Maternal Grandfather   . Cancer Maternal Grandfather        Melanoma  . Diabetes Paternal Grandfather      Social History: Beverly Torres lives at home with her family. Her dad recently walked out of the house, but it seems that he was not the  nicest person anyway. They live in a house that is 22 years old. There are hardwoods throughout the home. They have gas heating and central cooling. There are cats and dogs in the home. There are no dust mite coverings on the bedding. There is tobacco exposures in the home. Beverly Torres smokes 1/4 of a pack per day since 2011.      Review of Systems: a 14-point review of systems is pertinent for what is mentioned in HPI.  Otherwise, all other systems were negative. Constitutional: negative other than that listed in the HPI Eyes: negative other than that listed in the HPI Ears, nose, mouth, throat, and face: negative other than that listed in the HPI Respiratory: negative other than that listed in the HPI Cardiovascular: negative other than that listed in the HPI Gastrointestinal: negative other than that listed in the HPI Genitourinary: negative other than that listed in the HPI Integument: negative other than that listed in the HPI Hematologic: negative other than that  listed in the HPI Musculoskeletal: negative other than that listed in the HPI Neurological: negative other than that listed in the HPI Allergy/Immunologic: negative other than that listed in the HPI    Objective:   Blood pressure 108/60, pulse (!) 107, resp. rate 16, height 5\' 2"  (1.575 m), weight 229 lb (103.9 kg), last menstrual period 04/08/2017, SpO2 97 %. Body mass index is 41.88 kg/m.   Physical Exam:  General: Alert, interactive, in no acute distress. Pleasant female. Eyes: No conjunctival injection bilaterally, no discharge on the right, no discharge on the left and no Horner-Trantas dots present. PERRL bilaterally. EOMI without pain. No photophobia.  Ears: Right TM pearly gray with normal light reflex, Left TM pearly gray with normal light reflex, Right TM intact without perforation and Left TM intact without perforation.  Nose/Throat: External nose within normal limits and septum midline. Turbinates edematous and  pale with clear discharge. Posterior oropharynx erythematous without cobblestoning in the posterior oropharynx. Tonsils 2+ without exudates.  Tongue without thrush. Neck: Supple without thyromegaly. Trachea midline. Adenopathy: no enlarged lymph nodes appreciated in the anterior cervical, occipital, axillary, epitrochlear, inguinal, or popliteal regions. Lungs: Clear to auscultation without wheezing, rhonchi or rales. No increased work of breathing. CV: Normal S1/S2. No murmurs. Capillary refill <2 seconds.  Abdomen: Nondistended, nontender. No guarding or rebound tenderness. Bowel sounds present in all fields and hypoactive  Skin: Warm and dry, without lesions or rashes. Extremities:  No clubbing, cyanosis or edema. Neuro:   Grossly intact. No focal deficits appreciated. Responsive to questions.  Diagnostic studies: deferred due to pregnant state       Salvatore Marvel, MD [facility]

## 2017-10-13 NOTE — Patient Instructions (Addendum)
1. Seasonal allergic rhinitis - with recent allergic reaction - We will get some labs to look for environmental allergies, alpha-gal syndrome (red meat allergy), and strawberry allergy.  - In the meantime, I would add on Zyrtec (cetirizine) 10mg  as needed for allergic reactions. - Cetirizine is similar to Benadryl, but lasts a lot long. - Zyrtec (cetirizine) and Claritin (loratadine) are safe to use during pregnancy.   2. Return in about 6 months (around 04/13/2018). Bring the baby to your next appointment!    Please inform us of any Emergency Department visits, hospitalizations, or changes in symptoms. Call us before going to the ED for breathing or allergy symptoms since we might be able to fit you in for a sick visit. Feel free to contact us anytime with any questions, problems, or concerns.  It was a pleasure to meet you and your family today! Good luck with the rest of your pregnancy!  Websites that have reliable patient information: 1. American Academy of Asthma, Allergy, and Immunology: www.aaaai.org 2. Food Allergy Research and Education (FARE): foodallergy.org 3. Mothers of Asthmatics: http://www.asthmacommunitynetwork.org 4. American College of Allergy, Asthma, and Immunology: MonthlyElectricBill.co.uk   Make sure you are registered to vote! If you have moved or changed any of your contact information, you will need to get this updated before voting!

## 2017-10-17 ENCOUNTER — Encounter: Payer: Self-pay | Admitting: Allergy & Immunology

## 2017-10-18 LAB — IGE+ALLERGENS ZONE 2(30)
Alternaria Alternata IgE: <0.1 kU/L
Amer Sycamore IgE Qn: <0.1 kU/L
Bermuda Grass IgE: <0.1 kU/L
Cat Dander IgE: <0.1 kU/L
Cedar, Mountain IgE: <0.1 kU/L
Cladosporium Herbarum IgE: <0.1 kU/L
D Farinae IgE: <0.1 kU/L
D Pteronyssinus IgE: <0.1 kU/L
Dog Dander IgE: <0.1 kU/L
IgE (Immunoglobulin E), Serum: 22 IU/mL (ref 6–495)
Mucor Racemosus IgE: <0.1 kU/L
Pigweed, Rough IgE: <0.1 kU/L
Ragweed, Short IgE: <0.1 kU/L
Stemphylium Herbarum IgE: <0.1 kU/L
Sweet gum IgE RAST Ql: <0.1 kU/L
Timothy Grass IgE: <0.1 kU/L

## 2017-10-18 LAB — ALPHA-GAL PANEL
Alpha Gal IgE*: 1.65 kU/L — ABNORMAL HIGH (ref <0.10)
BEEF (BOS SPP) IGE: 0.34 kU/L (ref <0.35)
LAMB CLASS INTERPRETATION: 0
Lamb/Mutton (Ovis spp) IgE: <0.1 kU/L (ref <0.35)
Pork (Sus spp) IgE: 0.18 kU/L (ref <0.35)

## 2017-10-18 LAB — ALLERGEN, STRAWBERRY, F44

## 2017-10-21 ENCOUNTER — Other Ambulatory Visit: Payer: Self-pay | Admitting: *Deleted

## 2017-10-21 ENCOUNTER — Telehealth: Payer: Self-pay | Admitting: *Deleted

## 2017-10-21 MED ORDER — EPINEPHRINE 0.3 MG/0.3ML IJ SOAJ: 0.3000 mg | Freq: Once | INTRAMUSCULAR | 2 refills | Status: AC

## 2017-10-21 NOTE — Telephone Encounter (Signed)
I called and spoke with Beverly Torres to ensure that she did receive the message through [facility] from Dr. Ernst Bowler. I informed her that I have sent in an Epipen for her in regards to her allergy to red meat. I let her know that I will be sending an emergency action plan and information on the Alpha Gal allergy in the mail.

## 2017-10-24 ENCOUNTER — Other Ambulatory Visit: Payer: Self-pay | Admitting: Allergy

## 2017-10-24 MED ORDER — EPINEPHRINE 0.3 MG/0.3ML IJ SOAJ: 0.3000 mg | Freq: Once | INTRAMUSCULAR | 2 refills | Status: AC

## 2017-10-27 ENCOUNTER — Encounter: Payer: Self-pay | Admitting: Obstetrics and Gynecology

## 2017-10-27 ENCOUNTER — Other Ambulatory Visit: Payer: Medicaid Other

## 2017-10-27 ENCOUNTER — Ambulatory Visit (INDEPENDENT_AMBULATORY_CARE_PROVIDER_SITE_OTHER): Payer: Medicaid Other | Admitting: Obstetrics and Gynecology

## 2017-10-27 VITALS — BP 124/72 | HR 103 | Wt 227.0 lb

## 2017-10-27 DIAGNOSIS — O3440 Maternal care for other abnormalities of cervix, unspecified trimester: Secondary | ICD-10-CM

## 2017-10-27 DIAGNOSIS — O0990 Supervision of high risk pregnancy, unspecified, unspecified trimester: Secondary | ICD-10-CM

## 2017-10-27 DIAGNOSIS — O09899 Supervision of other high risk pregnancies, unspecified trimester: Secondary | ICD-10-CM

## 2017-10-27 DIAGNOSIS — Z283 Underimmunization status: Secondary | ICD-10-CM

## 2017-10-27 DIAGNOSIS — O9989 Other specified diseases and conditions complicating pregnancy, childbirth and the puerperium: Secondary | ICD-10-CM

## 2017-10-27 DIAGNOSIS — R87612 Low grade squamous intraepithelial lesion on cytologic smear of cervix (LGSIL): Secondary | ICD-10-CM

## 2017-10-27 NOTE — Progress Notes (Signed)
   PRENATAL VISIT NOTE  Subjective:  Beverly Torres is a 22 y.o. G1P0 at [redacted]w[redacted]d being seen today for ongoing prenatal care.  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  Patient reports no complaints.  Contractions: Not present. Vag. Bleeding: None.  Movement: Present. Denies leaking of fluid.   The following portions of the patient's history were reviewed and updated as appropriate: allergies, current medications, past family history, past medical history, past social history, past surgical history and problem list. Problem list updated.  Objective:   Vitals:   10/27/17 0834  BP: 124/72  Pulse: (!) 103  Weight: 227 lb (103 kg)    Fetal Status: Fetal Heart Rate (bpm): 135 Fundal Height: 29 cm Movement: Present     General:  Alert, oriented and cooperative. Patient is in no acute distress.  Skin: Skin is warm and dry. No rash noted.   Cardiovascular: Normal heart rate noted  Respiratory: Normal respiratory effort, no problems with respiration noted  Abdomen: Soft, gravid, appropriate for gestational age.  Pain/Pressure: Absent     Pelvic: Cervical exam deferred        Extremities: Normal range of motion.     Mental Status: Normal mood and affect. Normal behavior. Normal judgment and thought content.   Assessment and Plan:  Pregnancy: G1P0 at [redacted]w[redacted]d  1. Supervision of high risk pregnancy, antepartum Patient is doing well without complaints Third trimester labs  today  2. Rubella non-immune status, antepartum Will offer pp  3. 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 Please refer to After Visit Summary for other counseling recommendations.  Return in about 2 weeks (around 11/10/2017) for ROB.  No future appointments.  Mora Bellman, MD

## 2017-10-27 NOTE — Addendum Note (Signed)
Addended by: Lewie Loron D on: 10/27/2017 09:50 AM   Modules accepted: Orders

## 2017-10-28 ENCOUNTER — Encounter: Payer: Self-pay | Admitting: Obstetrics & Gynecology

## 2017-10-28 DIAGNOSIS — O24419 Gestational diabetes mellitus in pregnancy, unspecified control: Secondary | ICD-10-CM | POA: Insufficient documentation

## 2017-10-28 LAB — CBC
Hematocrit: 35.4 % (ref 34.0–46.6)
Hemoglobin: 11.7 g/dL (ref 11.1–15.9)
MCH: 31.5 pg (ref 26.6–33.0)
MCHC: 33.1 g/dL (ref 31.5–35.7)
MCV: 95 fL (ref 79–97)
Platelets: 271 10*3/uL (ref 150–450)
RBC: 3.71 x10E6/uL — AB (ref 3.77–5.28)
RDW: 13.3 % (ref 12.3–15.4)
WBC: 13.6 10*3/uL — ABNORMAL HIGH (ref 3.4–10.8)

## 2017-10-28 LAB — GLUCOSE TOLERANCE, 2 HOURS W/ 1HR
Glucose, 1 hour: 173 mg/dL (ref 65–179)
Glucose, 2 hour: 133 mg/dL (ref 65–152)
Glucose, Fasting: 104 mg/dL — ABNORMAL HIGH (ref 65–91)

## 2017-10-28 LAB — RPR: RPR Ser Ql: NONREACTIVE

## 2017-10-28 LAB — HIV ANTIBODY (ROUTINE TESTING W REFLEX): HIV Screen 4th Generation wRfx: NONREACTIVE

## 2017-10-31 ENCOUNTER — Other Ambulatory Visit: Payer: Self-pay

## 2017-10-31 DIAGNOSIS — O24419 Gestational diabetes mellitus in pregnancy, unspecified control: Secondary | ICD-10-CM

## 2017-10-31 MED ORDER — ACCU-CHEK GUIDE W/DEVICE KIT: 1.0000 | PACK | Freq: Four times a day (QID) | 0 refills | Status: DC

## 2017-10-31 MED ORDER — GLUCOSE BLOOD VI STRP: ORAL_STRIP | 12 refills | Status: DC

## 2017-10-31 MED ORDER — ACCU-CHEK FASTCLIX LANCETS MISC: 1.0000 | Freq: Four times a day (QID) | 12 refills | Status: DC

## 2017-11-02 ENCOUNTER — Encounter: Payer: Medicaid Other | Attending: Obstetrics and Gynecology | Admitting: Registered"

## 2017-11-02 ENCOUNTER — Encounter: Payer: Self-pay | Admitting: Registered"

## 2017-11-02 DIAGNOSIS — Z713 Dietary counseling and surveillance: Secondary | ICD-10-CM | POA: Diagnosis present

## 2017-11-02 DIAGNOSIS — O24419 Gestational diabetes mellitus in pregnancy, unspecified control: Secondary | ICD-10-CM | POA: Insufficient documentation

## 2017-11-02 DIAGNOSIS — O99810 Abnormal glucose complicating pregnancy: Secondary | ICD-10-CM | POA: Insufficient documentation

## 2017-11-02 NOTE — Progress Notes (Signed)

## 2017-11-10 ENCOUNTER — Encounter: Payer: Self-pay | Admitting: Obstetrics and Gynecology

## 2017-11-10 ENCOUNTER — Ambulatory Visit (INDEPENDENT_AMBULATORY_CARE_PROVIDER_SITE_OTHER): Payer: Medicaid Other | Admitting: Obstetrics and Gynecology

## 2017-11-10 VITALS — BP 94/57 | HR 111 | Wt 224.2 lb

## 2017-11-10 DIAGNOSIS — O24415 Gestational diabetes mellitus in pregnancy, controlled by oral hypoglycemic drugs: Secondary | ICD-10-CM

## 2017-11-10 DIAGNOSIS — Z283 Underimmunization status: Secondary | ICD-10-CM

## 2017-11-10 DIAGNOSIS — O99213 Obesity complicating pregnancy, third trimester: Secondary | ICD-10-CM

## 2017-11-10 DIAGNOSIS — O99210 Obesity complicating pregnancy, unspecified trimester: Secondary | ICD-10-CM

## 2017-11-10 DIAGNOSIS — O99332 Smoking (tobacco) complicating pregnancy, second trimester: Secondary | ICD-10-CM

## 2017-11-10 DIAGNOSIS — O99333 Smoking (tobacco) complicating pregnancy, third trimester: Secondary | ICD-10-CM

## 2017-11-10 DIAGNOSIS — O09899 Supervision of other high risk pregnancies, unspecified trimester: Secondary | ICD-10-CM

## 2017-11-10 DIAGNOSIS — O0990 Supervision of high risk pregnancy, unspecified, unspecified trimester: Secondary | ICD-10-CM

## 2017-11-10 DIAGNOSIS — O0993 Supervision of high risk pregnancy, unspecified, third trimester: Secondary | ICD-10-CM

## 2017-11-10 DIAGNOSIS — O9989 Other specified diseases and conditions complicating pregnancy, childbirth and the puerperium: Secondary | ICD-10-CM

## 2017-11-10 MED ORDER — METFORMIN HCL 500 MG PO TABS: 500.0000 mg | ORAL_TABLET | Freq: Two times a day (BID) | ORAL | 5 refills | Status: DC

## 2017-11-10 NOTE — Addendum Note (Signed)
Addended by: Vivien Rota on: 11/10/2017 04:45 PM   Modules accepted: Orders

## 2017-11-10 NOTE — Addendum Note (Signed)
Addended by: Vivien Rota on: 11/10/2017 04:47 PM   Modules accepted: Orders

## 2017-11-10 NOTE — Progress Notes (Signed)
Pt is here for ROB. G1P0 [redacted]w[redacted]d.

## 2017-11-10 NOTE — Progress Notes (Signed)
   PRENATAL VISIT NOTE  Subjective:  Beverly Torres is a 22 y.o. G1P0 at 30w6dbeing seen today for ongoing prenatal care.  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  Patient reports occasional back cramping.  Contractions: Not present. Vag. Bleeding: None.  Movement: Present. Denies leaking of fluid.   The following portions of the patient's history were reviewed and updated as appropriate: allergies, current medications, past family history, past medical history, past social history, past surgical history and problem list. Problem list updated.  Objective:   Vitals:   11/10/17 1624  BP: (!) 94/57  Pulse: (!) 111  Weight: 224 lb 3.2 oz (101.7 kg)    Fetal Status: Fetal Heart Rate (bpm): 138   Movement: Present     General:  Alert, oriented and cooperative. Patient is in no acute distress.  Skin: Skin is warm and dry. No rash noted.   Cardiovascular: Normal heart rate noted  Respiratory: Normal respiratory effort, no problems with respiration noted  Abdomen: Soft, gravid, appropriate for gestational age.  Pain/Pressure: Absent     Pelvic: Cervical exam deferred        Extremities: Normal range of motion.  Edema: Trace  Mental Status: Normal mood and affect. Normal behavior. Normal judgment and thought content.   Assessment and Plan:  Pregnancy: G1P0 at  35w6d1. Supervision of high risk pregnancy, antepartum  2. Smoking (tobacco) complicating pregnancy, second trimester Has cut down 4-5 cigs per day Smoking and tobacco cessation was discussed at today's visit for 5 minutes   3. Rubella non-immune status, antepartum MMR pp  4. Obesity affecting pregnancy, antepartum  5. Diet controlled gestational diabetes mellitus (GDM), antepartum Started checking BG 1 week ago FG: all are 107 PP: mostly 100-140 Will start metformin 500 mg BID Cont to check CBG   Preterm labor symptoms and general obstetric precautions including but not limited to vaginal bleeding, contractions, leaking of fluid and fetal movement were reviewed in detail with the patient. Please refer to After Visit Summary for other counseling recommendations.  Return in about 1 week (around 11/17/2017) for OB visit (MD).  No future appointments.  KeSloan LeiterMD

## 2017-11-16 ENCOUNTER — Other Ambulatory Visit (HOSPITAL_COMMUNITY): Payer: Self-pay | Admitting: *Deleted

## 2017-11-16 ENCOUNTER — Ambulatory Visit (HOSPITAL_COMMUNITY)
Admission: RE | Admit: 2017-11-16 | Discharge: 2017-11-16 | Disposition: A | Discharge status: Home / Self Care | Payer: Medicaid Other | Source: Ambulatory Visit | Attending: Obstetrics and Gynecology | Admitting: Obstetrics and Gynecology

## 2017-11-16 ENCOUNTER — Encounter (HOSPITAL_COMMUNITY): Payer: Self-pay

## 2017-11-16 DIAGNOSIS — O0990 Supervision of high risk pregnancy, unspecified, unspecified trimester: Secondary | ICD-10-CM

## 2017-11-16 DIAGNOSIS — Z3A31 31 weeks gestation of pregnancy: Secondary | ICD-10-CM | POA: Diagnosis not present

## 2017-11-16 DIAGNOSIS — O99333 Smoking (tobacco) complicating pregnancy, third trimester: Secondary | ICD-10-CM

## 2017-11-16 DIAGNOSIS — O99210 Obesity complicating pregnancy, unspecified trimester: Secondary | ICD-10-CM

## 2017-11-16 DIAGNOSIS — O99213 Obesity complicating pregnancy, third trimester: Secondary | ICD-10-CM | POA: Insufficient documentation

## 2017-11-16 DIAGNOSIS — O24415 Gestational diabetes mellitus in pregnancy, controlled by oral hypoglycemic drugs: Secondary | ICD-10-CM | POA: Diagnosis not present

## 2017-11-16 HISTORY — DX: Body Mass Index (BMI) 40.0 and over, adult: Z684

## 2017-11-16 HISTORY — DX: Morbid (severe) obesity due to excess calories: E66.01

## 2017-11-17 ENCOUNTER — Encounter: Payer: Self-pay | Admitting: Obstetrics

## 2017-11-17 ENCOUNTER — Ambulatory Visit (INDEPENDENT_AMBULATORY_CARE_PROVIDER_SITE_OTHER): Payer: Medicaid Other | Admitting: Obstetrics

## 2017-11-17 VITALS — BP 113/65 | HR 85 | Wt 222.9 lb

## 2017-11-17 DIAGNOSIS — R87612 Low grade squamous intraepithelial lesion on cytologic smear of cervix (LGSIL): Secondary | ICD-10-CM

## 2017-11-17 DIAGNOSIS — O3443 Maternal care for other abnormalities of cervix, third trimester: Secondary | ICD-10-CM

## 2017-11-17 DIAGNOSIS — O24415 Gestational diabetes mellitus in pregnancy, controlled by oral hypoglycemic drugs: Secondary | ICD-10-CM | POA: Diagnosis not present

## 2017-11-17 DIAGNOSIS — O3440 Maternal care for other abnormalities of cervix, unspecified trimester: Secondary | ICD-10-CM

## 2017-11-17 DIAGNOSIS — O0993 Supervision of high risk pregnancy, unspecified, third trimester: Secondary | ICD-10-CM

## 2017-11-17 DIAGNOSIS — O0990 Supervision of high risk pregnancy, unspecified, unspecified trimester: Secondary | ICD-10-CM

## 2017-11-17 NOTE — Progress Notes (Signed)
Pt presents for ROB/NST. She state that she stopped taking her Metformin due to diarrhea, and making her feel sick.

## 2017-11-17 NOTE — Progress Notes (Signed)
Subjective:  Beverly Torres is a 22 y.o. G1P0 at [redacted]w[redacted]d being seen today for ongoing prenatal care.  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  Patient reports no complaints.  Contractions: Not present. Vag. Bleeding: None.  Movement: Present. Denies leaking of fluid.   The following portions of the patient's history were reviewed and updated as appropriate: allergies, current medications, past family history, past medical history, past social history, past surgical history and problem list. Problem list updated.  Objective:   Vitals:   11/17/17 1120  BP: 113/65  Pulse: 85  Weight: 222 lb 14.4 oz (101.1 kg)    Fetal Status: Fetal Heart Rate (bpm): 140   Movement: Present     General:  Alert, oriented and cooperative. Patient is in no acute distress.  Skin: Skin is warm and dry. No rash noted.   Cardiovascular: Normal heart rate noted  Respiratory: Normal respiratory effort, no problems with respiration noted  Abdomen: Soft, gravid, appropriate for gestational age. Pain/Pressure: Present     Pelvic:  Cervical exam deferred        Extremities: Normal range of motion.  Edema: None  Mental Status: Normal mood and affect. Normal behavior. Normal judgment and thought content.   Urinalysis:      Assessment and Plan:  Pregnancy: G1P0 at [redacted]w[redacted]d  1. Supervision of  high risk pregnancy, antepartum  2. Gestational diabetes mellitus (GDM) in third trimester controlled on oral hypoglycemic drug Rx: - Fetal nonstress test:  Reactive.  Good variability.  15x15 accels.  No decels. - noncompliant with Metformin due to GI side effects  3. 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 Please refer to After Visit Summary for other counseling recommendations.  Return in about 1 week (around 11/24/2017) for ROB.  NST.   Shelly Bombard, MD

## 2017-11-23 ENCOUNTER — Encounter (HOSPITAL_COMMUNITY): Payer: Self-pay

## 2017-11-23 ENCOUNTER — Encounter: Payer: Self-pay | Admitting: Certified Nurse Midwife

## 2017-11-23 ENCOUNTER — Ambulatory Visit (HOSPITAL_COMMUNITY)
Admission: RE | Admit: 2017-11-23 | Discharge: 2017-11-23 | Disposition: A | Discharge status: Home / Self Care | Payer: Medicaid Other | Source: Ambulatory Visit | Attending: Obstetrics | Admitting: Obstetrics

## 2017-11-23 ENCOUNTER — Other Ambulatory Visit: Payer: Self-pay

## 2017-11-23 ENCOUNTER — Ambulatory Visit (INDEPENDENT_AMBULATORY_CARE_PROVIDER_SITE_OTHER): Payer: Medicaid Other | Admitting: Certified Nurse Midwife

## 2017-11-23 VITALS — BP 115/71 | HR 97 | Wt 225.2 lb

## 2017-11-23 DIAGNOSIS — O24415 Gestational diabetes mellitus in pregnancy, controlled by oral hypoglycemic drugs: Secondary | ICD-10-CM | POA: Diagnosis not present

## 2017-11-23 DIAGNOSIS — O99891 Other specified diseases and conditions complicating pregnancy: Secondary | ICD-10-CM

## 2017-11-23 DIAGNOSIS — O99213 Obesity complicating pregnancy, third trimester: Secondary | ICD-10-CM

## 2017-11-23 DIAGNOSIS — O9989 Other specified diseases and conditions complicating pregnancy, childbirth and the puerperium: Secondary | ICD-10-CM

## 2017-11-23 DIAGNOSIS — Z283 Underimmunization status: Secondary | ICD-10-CM

## 2017-11-23 DIAGNOSIS — O0993 Supervision of high risk pregnancy, unspecified, third trimester: Secondary | ICD-10-CM

## 2017-11-23 DIAGNOSIS — Z3A32 32 weeks gestation of pregnancy: Secondary | ICD-10-CM | POA: Insufficient documentation

## 2017-11-23 DIAGNOSIS — O0990 Supervision of high risk pregnancy, unspecified, unspecified trimester: Secondary | ICD-10-CM

## 2017-11-23 DIAGNOSIS — O99333 Smoking (tobacco) complicating pregnancy, third trimester: Secondary | ICD-10-CM

## 2017-11-23 DIAGNOSIS — Z23 Encounter for immunization: Secondary | ICD-10-CM | POA: Diagnosis not present

## 2017-11-23 DIAGNOSIS — O99210 Obesity complicating pregnancy, unspecified trimester: Secondary | ICD-10-CM

## 2017-11-23 DIAGNOSIS — O09899 Supervision of other high risk pregnancies, unspecified trimester: Secondary | ICD-10-CM

## 2017-11-23 MED ORDER — GLYBURIDE 2.5 MG PO TABS: 2.5000 mg | ORAL_TABLET | Freq: Every day | ORAL | 1 refills | Status: DC

## 2017-11-23 MED ORDER — ONDANSETRON 4 MG PO TBDP: 4.0000 mg | ORAL_TABLET | Freq: Three times a day (TID) | ORAL | 0 refills | Status: DC | PRN

## 2017-11-23 NOTE — Progress Notes (Signed)
PRENATAL VISIT NOTE  Subjective:  Beverly Torres is a 22 y.o. G1P0 at [redacted]w[redacted]d being seen today for ongoing prenatal care.  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  Patient reports no complaints.  Contractions: Not present. Vag. Bleeding: None.  Movement: Present. Denies leaking of fluid.   The following portions of the patient's history were reviewed and updated as appropriate: allergies, current medications, past family history, past medical history, past social history, past surgical history and problem list. Problem list updated.  Objective:   Vitals:   11/23/17 1035  BP: 115/71  Pulse: 97  Weight: 225 lb 3.2 oz (102.2 kg)    Fetal Status: Fetal Heart Rate (bpm): 135 Fundal Height: 36 cm Movement: Present     General:  Alert, oriented and cooperative. Patient is in no acute distress.  Skin: Skin is warm and dry. No rash noted.   Cardiovascular: Normal heart rate noted  Respiratory: Normal respiratory effort, no problems with respiration noted  Abdomen: Soft, gravid, appropriate for gestational age.  Pain/Pressure: Present     Pelvic: Cervical exam deferred        Extremities: Normal range of motion.  Edema: None  Mental Status: Normal mood and affect. Normal behavior. Normal judgment and thought content.   Assessment and Plan:  Pregnancy: G1P0 at  [redacted]w[redacted]d  1. Supervision of high risk pregnancy, antepartum - patient doing well, noncompliant of Metformin  - Anticipatory guidance on upcoming appointments  - ondansetron (ZOFRAN ODT) 4 MG disintegrating tablet; Take 1 tablet (4 mg total) by mouth every 8 (eight) hours as needed for nausea or vomiting.  Dispense: 30 tablet; Refill: 0  2. Gestational diabetes mellitus (GDM) controlled on oral hypoglycemic drug, antepartum - Noncomplaint with Metformin  - Patient was taking Metformin for 2 weeks but stopped last Thursday due to side effects of diarrhea, HA, and nausea. She reports not taking her glucose levels since stopping medication.  - Patient reports not eating since last night around 2300, fasting CBG obtained, noted to be 119 - Recommended insulin for control of GDM, patient refuses insulin at this time states she "is not going to give herself shots and will not remember multiple doses". - Discussed with MFM and Dr Rip Harbour, both recommend Glyburide for management of GDM since patient refuses insulin and noncompliance with Metformin.  -Educated and discussed importance of taking glucose levels 3-4 times per day, one- fasting and 3- 2hr postprandial. Gave patient log sheet to be able to write down levels and bring with her at each and every appointment. Teach back used and patient verbalizes understanding.  - Will start patient on Glyburide 2.5mg  nightly per Dr Rip Harbour recommendation with possibility of increasing based on levels. To be seen next week to evaluate glucose log and management on Glyburide.  - Continue antenatal weekly testing with follow up growth [place] scheduled  - glyBURIDE (DIABETA) 2.5 MG tablet; Take 1 tablet (2.5 mg total) by mouth at bedtime.  Dispense: 30 tablet; Refill: 1 - POCT CBG (Fasting - Glucose)  3. Obesity affecting pregnancy, antepartum - BMI  40 - TWG 6lb 3.2oz   4. Rubella non-immune status, antepartum   Preterm labor symptoms and general obstetric precautions  including but not limited to vaginal bleeding, contractions, leaking of fluid and fetal movement were reviewed in detail with the patient. Please refer to After Visit Summary for other counseling recommendations.  Return in about 1 week (around 11/30/2017) for ROB.  Continue weekly BPP and follow up fetal growth [place] as scheduled   Future Appointments  Date Time Provider [place]  11/30/2017  8:45 AM [facility]-MFC [place] 5 WH-MFCUS MFC-US  11/30/2017 10:10 AM Rasch, Artist Pais, NP [facility] None  12/07/2017  9:15 AM WH-MFC [place] 4 WH-MFCUS MFC-US  12/07/2017 10:35 AM Lajean Manes, CNM [facility] None  12/14/2017  9:30 AM WH-MFC [place] 1 WH-MFCUS MFC-US    Lajean Manes, CNM

## 2017-11-23 NOTE — Patient Instructions (Signed)

## 2017-11-23 NOTE — Progress Notes (Signed)
ROB.  C/o Metformin making her sick. TDAP given in RD, tolerated well.

## 2017-11-30 ENCOUNTER — Encounter (HOSPITAL_COMMUNITY): Payer: Self-pay

## 2017-11-30 ENCOUNTER — Ambulatory Visit (INDEPENDENT_AMBULATORY_CARE_PROVIDER_SITE_OTHER): Payer: Medicaid Other | Admitting: Obstetrics and Gynecology

## 2017-11-30 ENCOUNTER — Ambulatory Visit (HOSPITAL_COMMUNITY)
Admission: RE | Admit: 2017-11-30 | Discharge: 2017-11-30 | Disposition: A | Discharge status: Home / Self Care | Payer: Medicaid Other | Source: Ambulatory Visit | Attending: Obstetrics | Admitting: Obstetrics

## 2017-11-30 ENCOUNTER — Encounter: Payer: Self-pay | Admitting: Obstetrics and Gynecology

## 2017-11-30 VITALS — BP 106/67 | HR 99 | Temp 98.0°F | Wt 225.2 lb

## 2017-11-30 DIAGNOSIS — O99213 Obesity complicating pregnancy, third trimester: Secondary | ICD-10-CM

## 2017-11-30 DIAGNOSIS — O24415 Gestational diabetes mellitus in pregnancy, controlled by oral hypoglycemic drugs: Secondary | ICD-10-CM

## 2017-11-30 DIAGNOSIS — Z3A33 33 weeks gestation of pregnancy: Secondary | ICD-10-CM | POA: Insufficient documentation

## 2017-11-30 DIAGNOSIS — O99210 Obesity complicating pregnancy, unspecified trimester: Secondary | ICD-10-CM

## 2017-11-30 DIAGNOSIS — O99810 Abnormal glucose complicating pregnancy: Secondary | ICD-10-CM

## 2017-11-30 DIAGNOSIS — O0990 Supervision of high risk pregnancy, unspecified, unspecified trimester: Secondary | ICD-10-CM

## 2017-11-30 DIAGNOSIS — O99333 Smoking (tobacco) complicating pregnancy, third trimester: Secondary | ICD-10-CM

## 2017-11-30 DIAGNOSIS — R8290 Unspecified abnormal findings in urine: Secondary | ICD-10-CM

## 2017-11-30 HISTORY — DX: Gestational diabetes mellitus in pregnancy, unspecified control: O24.419

## 2017-11-30 HISTORY — DX: Type 2 diabetes mellitus without complications: E11.9

## 2017-11-30 LAB — POCT URINALYSIS DIPSTICK
BILIRUBIN UA: NEGATIVE
GLUCOSE UA: NEGATIVE
Leukocytes, UA: NEGATIVE
Nitrite, UA: NEGATIVE
Odor: NORMAL
Protein, UA: POSITIVE — AB
RBC UA: NEGATIVE
Spec Grav, UA: 1.015 (ref 1.010–1.025)
Urobilinogen, UA: 0.2 E.U./dL
pH, UA: 6 (ref 5.0–8.0)

## 2017-11-30 MED ORDER — ASPIRIN EC 81 MG PO TBEC: 81.0000 mg | DELAYED_RELEASE_TABLET | Freq: Every day | ORAL | 2 refills | Status: DC

## 2017-11-30 NOTE — Progress Notes (Signed)
Pt presents for ROB c/o urinary odor, denies dysuria and fever. Pt said she was not aware to start ASA.

## 2017-11-30 NOTE — Progress Notes (Signed)
   PRENATAL VISIT NOTE  Subjective:  Beverly Torres is a 22 y.o. G1P0 at [redacted]w[redacted]d being seen today for ongoing prenatal care.  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  Patient reports no complaints.  Contractions: Not present. Vag. Bleeding: None.  Movement: Present. Denies leaking of fluid.   The following portions of the patient's history were reviewed and updated as appropriate: allergies, current medications, past family history, past medical history, past social history, past surgical history and problem list. Problem list updated.    Objective:   Vitals:   11/30/17 1002  BP: 106/67  Pulse: 99  Temp: 98 F (36.7 C)  Weight: 225 lb 3.2 oz (102.2 kg)    Fetal Status:   Fundal Height: 33 cm Movement: Present     General:  Alert, oriented and cooperative. Patient is in no acute distress.  Skin: Skin is warm and dry. No rash noted.   Cardiovascular: Normal heart rate noted  Respiratory: Normal respiratory effort, no problems with respiration noted  Abdomen: Soft, gravid, appropriate for gestational age.  Pain/Pressure: Present     Pelvic: Cervical exam deferred        Extremities: Normal range of motion.  Edema: None  Mental Status: Normal mood and affect. Normal behavior. Normal judgment and thought content.   Assessment and Plan:  Pregnancy: G1P0 at  [redacted]w[redacted]d  1. Supervision of high risk pregnancy, antepartum - Culture, OB Urine  2. Abnormal urine odor - POCT Urinalysis Dipstick  3. Abnormal glucose tolerance test (GTT) during pregnancy, antepartum  - non compliance   4. Gestational diabetes mellitus (GDM) controlled on oral hypoglycemic drug, antepartum  - Not checking BS like she should -Taking Glyberide 1 X per day   -Not checking BS fasting, not checking PP, brought log however fasting blood sugars high due to her drinking a glass of milk before checking sugar.  - 1926 gm on 10/9 Growth - Weekly BPP - Has not started ASA, RX sent.  - BPP today 8/8 -Growth [place] scheduled for 11/6 - EST FW 1926 gm on 10/9  Lengthy discussion about importance of testing blood sugars, bringing log book and keeping appointments. Discussed risks of uncontrolled DM in pregnancy including delayed fetal lung maturity, IUFD and shoulder dystocia possibly resulting brachial plexus palsy, brain damage, intrapartum death and extensive obstetric lacerations. Patient verbalizes understanding.   Other orders - aspirin EC 81 MG tablet; Take 1 tablet (81 mg total) by mouth daily.  Preterm labor symptoms and general obstetric precautions including but not limited to vaginal bleeding, contractions, leaking of fluid and fetal movement were reviewed in detail with the patient. Please refer to After Visit Summary for other counseling recommendations.  No follow-ups on file.  Future Appointments  Date Time Provider [place]  12/07/2017  9:15 AM [place] 4 WH-MFCUS MFC-US  12/07/2017 10:35 AM Lajean Manes, CNM [facility] None  12/14/2017  9:30 AM WH-MFC [place] 1 WH-MFCUS MFC-US    Noni Saupe, NP

## 2017-12-02 LAB — CULTURE, OB URINE

## 2017-12-02 LAB — URINE CULTURE, OB REFLEX

## 2017-12-07 ENCOUNTER — Ambulatory Visit (HOSPITAL_COMMUNITY)
Admission: RE | Admit: 2017-12-07 | Discharge: 2017-12-07 | Disposition: A | Discharge status: Home / Self Care | Payer: Medicaid Other | Source: Ambulatory Visit | Attending: Obstetrics | Admitting: Obstetrics

## 2017-12-07 ENCOUNTER — Ambulatory Visit (INDEPENDENT_AMBULATORY_CARE_PROVIDER_SITE_OTHER): Payer: Medicaid Other | Admitting: Certified Nurse Midwife

## 2017-12-07 ENCOUNTER — Encounter (HOSPITAL_COMMUNITY): Payer: Self-pay

## 2017-12-07 ENCOUNTER — Encounter: Payer: Self-pay | Admitting: Certified Nurse Midwife

## 2017-12-07 VITALS — BP 119/75 | HR 89 | Wt 222.0 lb

## 2017-12-07 DIAGNOSIS — O24415 Gestational diabetes mellitus in pregnancy, controlled by oral hypoglycemic drugs: Secondary | ICD-10-CM

## 2017-12-07 DIAGNOSIS — Z3A34 34 weeks gestation of pregnancy: Secondary | ICD-10-CM | POA: Diagnosis not present

## 2017-12-07 DIAGNOSIS — E669 Obesity, unspecified: Secondary | ICD-10-CM | POA: Diagnosis not present

## 2017-12-07 DIAGNOSIS — Z283 Underimmunization status: Secondary | ICD-10-CM

## 2017-12-07 DIAGNOSIS — Z2839 Other underimmunization status: Secondary | ICD-10-CM

## 2017-12-07 DIAGNOSIS — O9989 Other specified diseases and conditions complicating pregnancy, childbirth and the puerperium: Secondary | ICD-10-CM

## 2017-12-07 DIAGNOSIS — F17210 Nicotine dependence, cigarettes, uncomplicated: Secondary | ICD-10-CM | POA: Insufficient documentation

## 2017-12-07 DIAGNOSIS — O99213 Obesity complicating pregnancy, third trimester: Secondary | ICD-10-CM

## 2017-12-07 DIAGNOSIS — O99891 Other specified diseases and conditions complicating pregnancy: Secondary | ICD-10-CM

## 2017-12-07 DIAGNOSIS — O0990 Supervision of high risk pregnancy, unspecified, unspecified trimester: Secondary | ICD-10-CM

## 2017-12-07 DIAGNOSIS — O99333 Smoking (tobacco) complicating pregnancy, third trimester: Secondary | ICD-10-CM | POA: Diagnosis not present

## 2017-12-07 DIAGNOSIS — O99210 Obesity complicating pregnancy, unspecified trimester: Secondary | ICD-10-CM

## 2017-12-07 MED ORDER — GLYBURIDE 2.5 MG PO TABS: 2.5000 mg | ORAL_TABLET | Freq: Two times a day (BID) | ORAL | 1 refills | Status: DC

## 2017-12-07 NOTE — Progress Notes (Signed)
PRENATAL VISIT NOTE  Subjective:  Beverly Torres is a 22 y.o. G1P0 at [redacted]w[redacted]d being seen today for ongoing prenatal care.  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  Patient reports no complaints.  Contractions: Irregular. Vag. Bleeding: None.  Movement: Present. Denies leaking of fluid.   The following portions of the patient's history were reviewed and updated as appropriate: allergies, current medications, past family history, past medical history, past social history, past surgical history and problem list. Problem list updated.  Objective:   Vitals:   12/07/17 1036  BP: 119/75  Pulse: 89  Weight: 222 lb (100.7 kg)    Fetal Status: Fetal Heart Rate (bpm): 160 Fundal Height: 36 cm Movement: Present  Presentation: Vertex  General:  Alert, oriented and cooperative. Patient is in no acute distress.  Skin: Skin is warm and dry. No rash noted.   Cardiovascular: Normal heart rate noted  Respiratory: Normal respiratory effort, no problems with respiration noted  Abdomen: Soft, gravid, appropriate for gestational age.  Pain/Pressure: Present     Pelvic: Cervical exam deferred        Extremities: Normal range of motion.     Mental Status: Normal mood and affect. Normal behavior. Normal judgment and thought content.   Assessment and Plan:  Pregnancy: G1P0 at  [redacted]w[redacted]d  1. Supervision of high risk pregnancy, antepartum - Patient doing well, no complaints  - Continue antenatal testing for GDM  - BPP done today, BPP and fetal growth scheduled for next week  - Anticipatory guidance on upcoming appointment   2. Gestational diabetes mellitus (GDM) controlled on oral hypoglycemic drug, antepartum - Currently taking 2.5mg  glyburide daily, increased to twice a day based on glucose log.  - fasting glucose range from 106-132 - PP range from 84-173, 85% of PP glucose being a acceptable range  - Educated and discussed continuing diet changes and not consuming more milkshake or carbs because she on medication. Patient verbalizes understanding  - Patient continues to refuse insulin, discussed she has one more chance for bring glucose down by next appointment or she will be placed on insulin- patient verbalizes understanding - Fetal growth [place] scheduled for 11/6 - Plan for IOL at 39 weeks  - glyBURIDE (DIABETA) 2.5 MG tablet; Take 1 tablet (2.5 mg total) by mouth 2 (two) times daily with a meal.  Dispense: 60 tablet; Refill: 1  3. Rubella non-immune status, antepartum - Needs PP   4. Obesity affecting pregnancy, antepartum - Continue aspirin until delivery   5. Smoking (tobacco) complicating pregnancy, third trimester - Reports she is trying to cut down and currently smoking a quarter of a pack/day  Preterm labor symptoms and general obstetric precautions including but not limited to vaginal bleeding, contractions, leaking of fluid and fetal movement were reviewed in detail with the patient. Please refer to After Visit Summary for other counseling recommendations.  Return in about 9 days (around 12/16/2017) for HROB.  Future Appointments  Date Time Provider [place]  12/14/2017  9:30 AM WH-MFC [place] 1 WH-MFCUS MFC-US  12/16/2017  9:15 AM  Chancy Milroy, MD [place] None    Lajean Manes, CNM

## 2017-12-14 ENCOUNTER — Encounter (HOSPITAL_COMMUNITY): Payer: Self-pay

## 2017-12-14 ENCOUNTER — Ambulatory Visit (HOSPITAL_COMMUNITY)
Admission: RE | Admit: 2017-12-14 | Discharge: 2017-12-14 | Disposition: A | Discharge status: Home / Self Care | Payer: Medicaid Other | Source: Ambulatory Visit | Attending: Obstetrics | Admitting: Obstetrics

## 2017-12-14 DIAGNOSIS — O99333 Smoking (tobacco) complicating pregnancy, third trimester: Secondary | ICD-10-CM | POA: Insufficient documentation

## 2017-12-14 DIAGNOSIS — Z3A35 35 weeks gestation of pregnancy: Secondary | ICD-10-CM | POA: Diagnosis not present

## 2017-12-14 DIAGNOSIS — O24415 Gestational diabetes mellitus in pregnancy, controlled by oral hypoglycemic drugs: Secondary | ICD-10-CM | POA: Diagnosis not present

## 2017-12-14 DIAGNOSIS — O99213 Obesity complicating pregnancy, third trimester: Secondary | ICD-10-CM | POA: Insufficient documentation

## 2017-12-15 ENCOUNTER — Other Ambulatory Visit (HOSPITAL_COMMUNITY): Payer: Self-pay | Admitting: *Deleted

## 2017-12-15 DIAGNOSIS — O24415 Gestational diabetes mellitus in pregnancy, controlled by oral hypoglycemic drugs: Secondary | ICD-10-CM

## 2017-12-16 ENCOUNTER — Other Ambulatory Visit (HOSPITAL_COMMUNITY)
Admission: RE | Admit: 2017-12-16 | Discharge: 2017-12-16 | Disposition: A | Discharge status: Home / Self Care | Payer: Medicaid Other | Source: Ambulatory Visit | Attending: Obstetrics and Gynecology | Admitting: Obstetrics and Gynecology

## 2017-12-16 ENCOUNTER — Encounter: Payer: Self-pay | Admitting: Obstetrics and Gynecology

## 2017-12-16 ENCOUNTER — Ambulatory Visit (INDEPENDENT_AMBULATORY_CARE_PROVIDER_SITE_OTHER): Payer: Medicaid Other | Admitting: Obstetrics and Gynecology

## 2017-12-16 VITALS — BP 107/70 | HR 94 | Wt 225.0 lb

## 2017-12-16 DIAGNOSIS — O0990 Supervision of high risk pregnancy, unspecified, unspecified trimester: Secondary | ICD-10-CM | POA: Diagnosis not present

## 2017-12-16 DIAGNOSIS — O24415 Gestational diabetes mellitus in pregnancy, controlled by oral hypoglycemic drugs: Secondary | ICD-10-CM

## 2017-12-16 DIAGNOSIS — O0993 Supervision of high risk pregnancy, unspecified, third trimester: Secondary | ICD-10-CM

## 2017-12-16 LAB — OB RESULTS CONSOLE GC/CHLAMYDIA: GC PROBE AMP, GENITAL: NEGATIVE

## 2017-12-16 NOTE — Patient Instructions (Signed)
Vaginal Delivery Vaginal delivery means that you will give birth by pushing your baby out of your birth canal (vagina). A team of health care providers will help you before, during, and after vaginal delivery. Birth experiences are unique for every woman and every pregnancy, and birth experiences vary depending on where you choose to give birth. What should I do to prepare for my baby's birth? Before your baby is born, it is important to talk with your health care provider about:  Your labor and delivery preferences. These may include: ? Medicines that you may be given. ? How you will manage your pain. This might include non-medical pain relief techniques or injectable pain relief such as epidural analgesia. ? How you and your baby will be monitored during labor and delivery. ? Who may be in the labor and delivery room with you. ? Your feelings about surgical delivery of your baby (cesarean delivery, or C-section) if this becomes necessary. ? Your feelings about receiving donated blood through an IV tube (blood transfusion) if this becomes necessary.  Whether you are able: ? To take pictures or videos of the birth. ? To eat during labor and delivery. ? To move around, walk, or change positions during labor and delivery.  What to expect after your baby is born, such as: ? Whether delayed umbilical cord clamping and cutting is offered. ? Who will care for your baby right after birth. ? Medicines or tests that may be recommended for your baby. ? Whether breastfeeding is supported in your hospital or birth center. ? How long you will be in the hospital or birth center.  How any medical conditions you have may affect your baby or your labor and delivery experience.  To prepare for your baby's birth, you should also:  Attend all of your health care visits before delivery (prenatal visits) as recommended by your health care provider. This is important.  Prepare your home for your baby's  arrival. Make sure that you have: ? Diapers. ? Baby clothing. ? Feeding equipment. ? Safe sleeping arrangements for you and your baby.  Install a car seat in your vehicle. Have your car seat checked by a certified car seat installer to make sure that it is installed safely.  Think about who will help you with your new baby at home for at least the first several weeks after delivery.  What can I expect when I arrive at the birth center or hospital? Once you are in labor and have been admitted into the hospital or birth center, your health care provider may:  Review your pregnancy history and any concerns you have.  Insert an IV tube into one of your veins. This is used to give you fluids and medicines.  Check your blood pressure, pulse, temperature, and heart rate (vital signs).  Check whether your bag of water (amniotic sac) has broken (ruptured).  Talk with you about your birth plan and discuss pain control options.  Monitoring Your health care provider may monitor your contractions (uterine monitoring) and your baby's heart rate (fetal monitoring). You may need to be monitored:  Often, but not continuously (intermittently).  All the time or for long periods at a time (continuously). Continuous monitoring may be needed if: ? You are taking certain medicines, such as medicine to relieve pain or make your contractions stronger. ? You have pregnancy or labor complications.  Monitoring may be done by:  Placing a special stethoscope or a handheld monitoring device on your abdomen to   check your baby's heartbeat, and feeling your abdomen for contractions. This method of monitoring does not continuously record your baby's heartbeat or your contractions.  Placing monitors on your abdomen (external monitors) to record your baby's heartbeat and the frequency and length of contractions. You may not have to wear external monitors all the time.  Placing monitors inside of your uterus  (internal monitors) to record your baby's heartbeat and the frequency, length, and strength of your contractions. ? Your health care provider may use internal monitors if he or she needs more information about the strength of your contractions or your baby's heart rate. ? Internal monitors are put in place by passing a thin, flexible wire through your vagina and into your uterus. Depending on the type of monitor, it may remain in your uterus or on your baby's head until birth. ? Your health care provider will discuss the benefits and risks of internal monitoring with you and will ask for your permission before inserting the monitors.  Telemetry. This is a type of continuous monitoring that can be done with external or internal monitors. Instead of having to stay in bed, you are able to move around during telemetry. Ask your health care provider if telemetry is an option for you.  Physical exam Your health care provider may perform a physical exam. This may include:  Checking whether your baby is positioned: ? With the head toward your vagina (head-down). This is most common. ? With the head toward the top of your uterus (head-up or breech). If your baby is in a breech position, your health care provider may try to turn your baby to a head-down position so you can deliver vaginally. If it does not seem that your baby can be born vaginally, your provider may recommend surgery to deliver your baby. In rare cases, you may be able to deliver vaginally if your baby is head-up (breech delivery). ? Lying sideways (transverse). Babies that are lying sideways cannot be delivered vaginally.  Checking your cervix to determine: ? Whether it is thinning out (effacing). ? Whether it is opening up (dilating). ? How low your baby has moved into your birth canal.  What are the three stages of labor and delivery?  Normal labor and delivery is divided into the following three stages: Stage 1  Stage 1 is the  longest stage of labor, and it can last for hours or days. Stage 1 includes: ? Early labor. This is when contractions may be irregular, or regular and mild. Generally, early labor contractions are more than 10 minutes apart. ? Active labor. This is when contractions get longer, more regular, more frequent, and more intense. ? The transition phase. This is when contractions happen very close together, are very intense, and may last longer than during any other part of labor.  Contractions generally feel mild, infrequent, and irregular at first. They get stronger, more frequent (about every 2-3 minutes), and more regular as you progress from early labor through active labor and transition.  Many women progress through stage 1 naturally, but you may need help to continue making progress. If this happens, your health care provider may talk with you about: ? Rupturing your amniotic sac if it has not ruptured yet. ? Giving you medicine to help make your contractions stronger and more frequent.  Stage 1 ends when your cervix is completely dilated to 4 inches (10 cm) and completely effaced. This happens at the end of the transition phase. Stage 2  Once   your cervix is completely effaced and dilated to 4 inches (10 cm), you may start to feel an urge to push. It is common for the body to naturally take a rest before feeling the urge to push, especially if you received an epidural or certain other pain medicines. This rest period may last for up to 1-2 hours, depending on your unique labor experience.  During stage 2, contractions are generally less painful, because pushing helps relieve contraction pain. Instead of contraction pain, you may feel stretching and burning pain, especially when the widest part of your baby's head passes through the vaginal opening (crowning).  Your health care provider will closely monitor your pushing progress and your baby's progress through the vagina during stage 2.  Your  health care provider may massage the area of skin between your vaginal opening and anus (perineum) or apply warm compresses to your perineum. This helps it stretch as the baby's head starts to crown, which can help prevent perineal tearing. ? In some cases, an incision may be made in your perineum (episiotomy) to allow the baby to pass through the vaginal opening. An episiotomy helps to make the opening of the vagina larger to allow more room for the baby to fit through.  It is very important to breathe and focus so your health care provider can control the delivery of your baby's head. Your health care provider may have you decrease the intensity of your pushing, to help prevent perineal tearing.  After delivery of your baby's head, the shoulders and the rest of the body generally deliver very quickly and without difficulty.  Once your baby is delivered, the umbilical cord may be cut right away, or this may be delayed for 1-2 minutes, depending on your baby's health. This may vary among health care providers, hospitals, and birth centers.  If you and your baby are healthy enough, your baby may be placed on your chest or abdomen to help maintain the baby's temperature and to help you bond with each other. Some mothers and babies start breastfeeding at this time. Your health care team will dry your baby and help keep your baby warm during this time.  Your baby may need immediate care if he or she: ? Showed signs of distress during labor. ? Has a medical condition. ? Was born too early (prematurely). ? Had a bowel movement before birth (meconium). ? Shows signs of difficulty transitioning from being inside the uterus to being outside of the uterus. If you are planning to breastfeed, your health care team will help you begin a feeding. Stage 3  The third stage of labor starts immediately after the birth of your baby and ends after you deliver the placenta. The placenta is an organ that develops  during pregnancy to provide oxygen and nutrients to your baby in the womb.  Delivering the placenta may require some pushing, and you may have mild contractions. Breastfeeding can stimulate contractions to help you deliver the placenta.  After the placenta is delivered, your uterus should tighten (contract) and become firm. This helps to stop bleeding in your uterus. To help your uterus contract and to control bleeding, your health care provider may: ? Give you medicine by injection, through an IV tube, by mouth, or through your rectum (rectally). ? Massage your abdomen or perform a vaginal exam to remove any blood clots that are left in your uterus. ? Empty your bladder by placing a thin, flexible tube (catheter) into your bladder. ? Encourage   you to breastfeed your baby. After labor is over, you and your baby will be monitored closely to ensure that you are both healthy until you are ready to go home. Your health care team will teach you how to care for yourself and your baby. This information is not intended to replace advice given to you by your health care provider. Make sure you discuss any questions you have with your health care provider. Document Released: 11/04/2007 Document Revised: 08/15/2015 Document Reviewed: 02/09/2015 [employer] Interactive Patient Education  2018 [employer]  

## 2017-12-16 NOTE — Progress Notes (Signed)
Subjective:  Beverly Torres is a 22 y.o. G1P0 at [redacted]w[redacted]d being seen today for ongoing prenatal care.  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  Patient reports general discomforts of pregnancy.  Contractions: Irregular. Vag. Bleeding: None.  Movement: Present. Denies leaking of fluid.   The following portions of the patient's history were reviewed and updated as appropriate: allergies, current medications, past family history, past medical history, past social history, past surgical history and problem list. Problem list updated.  Objective:   Vitals:   12/16/17 0935  BP: 107/70  Pulse: 94  Weight: 225 lb (102.1 kg)    Fetal Status: Fetal Heart Rate (bpm): 125   Movement: Present     General:  Alert, oriented and cooperative. Patient is in no acute distress.  Skin: Skin is warm and dry. No rash noted.   Cardiovascular: Normal heart rate noted  Respiratory: Normal respiratory effort, no problems with respiration noted  Abdomen: Soft, gravid, appropriate for gestational age. Pain/Pressure: Present     Pelvic:  Cervical exam performed        Extremities: Normal range of motion.     Mental Status: Normal mood and affect. Normal behavior. Normal judgment and thought content.   Urinalysis:      Assessment and Plan:  Pregnancy: G1P0 at [redacted]w[redacted]d  1. Supervision of high risk pregnancy, antepartum Stable Labor precautions - Strep Gp  B NAA - Cervicovaginal ancillary only  2. Gestational diabetes mellitus (GDM) controlled on oral hypoglycemic drug, antepartum CBG's in [place] range for the most part Importance of following same schedule for meals and taking medication reviewed with pt Continue with weekly antenatal testing  Preterm labor symptoms and general obstetric precautions including but not limited to vaginal bleeding, contractions, leaking of fluid and fetal movement were reviewed in detail with the patient. Please refer to After Visit Summary for other counseling recommendations.  Return in about 1 week (around 12/23/2017) for OB visit.   Chancy Milroy, MD

## 2017-12-18 LAB — STREP GP B NAA: Strep Gp B NAA: NEGATIVE

## 2017-12-19 LAB — CERVICOVAGINAL ANCILLARY ONLY
CHLAMYDIA, DNA PROBE: NEGATIVE
Neisseria Gonorrhea: NEGATIVE

## 2017-12-21 ENCOUNTER — Ambulatory Visit (HOSPITAL_COMMUNITY)
Admission: RE | Admit: 2017-12-21 | Discharge: 2017-12-21 | Disposition: A | Discharge status: Home / Self Care | Payer: Medicaid Other | Source: Ambulatory Visit | Attending: Obstetrics | Admitting: Obstetrics

## 2017-12-21 DIAGNOSIS — Z3A36 36 weeks gestation of pregnancy: Secondary | ICD-10-CM | POA: Diagnosis not present

## 2017-12-21 DIAGNOSIS — O99333 Smoking (tobacco) complicating pregnancy, third trimester: Secondary | ICD-10-CM | POA: Insufficient documentation

## 2017-12-21 DIAGNOSIS — O24415 Gestational diabetes mellitus in pregnancy, controlled by oral hypoglycemic drugs: Secondary | ICD-10-CM

## 2017-12-21 DIAGNOSIS — O99213 Obesity complicating pregnancy, third trimester: Secondary | ICD-10-CM | POA: Insufficient documentation

## 2017-12-22 ENCOUNTER — Encounter: Payer: Self-pay | Admitting: Obstetrics and Gynecology

## 2017-12-22 ENCOUNTER — Ambulatory Visit (INDEPENDENT_AMBULATORY_CARE_PROVIDER_SITE_OTHER): Payer: Medicaid Other | Admitting: Obstetrics and Gynecology

## 2017-12-22 VITALS — BP 121/79 | HR 111 | Wt 225.0 lb

## 2017-12-22 DIAGNOSIS — O0993 Supervision of high risk pregnancy, unspecified, third trimester: Secondary | ICD-10-CM

## 2017-12-22 DIAGNOSIS — O99210 Obesity complicating pregnancy, unspecified trimester: Secondary | ICD-10-CM

## 2017-12-22 DIAGNOSIS — O0990 Supervision of high risk pregnancy, unspecified, unspecified trimester: Secondary | ICD-10-CM

## 2017-12-22 DIAGNOSIS — O99213 Obesity complicating pregnancy, third trimester: Secondary | ICD-10-CM

## 2017-12-22 DIAGNOSIS — Z283 Underimmunization status: Secondary | ICD-10-CM

## 2017-12-22 DIAGNOSIS — O09899 Supervision of other high risk pregnancies, unspecified trimester: Secondary | ICD-10-CM

## 2017-12-22 DIAGNOSIS — O3440 Maternal care for other abnormalities of cervix, unspecified trimester: Secondary | ICD-10-CM

## 2017-12-22 DIAGNOSIS — O99332 Smoking (tobacco) complicating pregnancy, second trimester: Secondary | ICD-10-CM

## 2017-12-22 DIAGNOSIS — O3443 Maternal care for other abnormalities of cervix, third trimester: Secondary | ICD-10-CM

## 2017-12-22 DIAGNOSIS — R87612 Low grade squamous intraepithelial lesion on cytologic smear of cervix (LGSIL): Secondary | ICD-10-CM

## 2017-12-22 DIAGNOSIS — O9989 Other specified diseases and conditions complicating pregnancy, childbirth and the puerperium: Secondary | ICD-10-CM

## 2017-12-22 DIAGNOSIS — O24415 Gestational diabetes mellitus in pregnancy, controlled by oral hypoglycemic drugs: Secondary | ICD-10-CM

## 2017-12-22 NOTE — Progress Notes (Signed)
   PRENATAL VISIT NOTE  Subjective:  Beverly Torres is a 22 y.o. G1P0 at 78w6dbeing seen today for ongoing prenatal care.  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  Patient reports back pain.  Contractions: Irregular. Vag. Bleeding: None.  Movement: Present. Denies leaking of fluid.   The following portions of the patient's history were reviewed and updated as appropriate: allergies, current medications, past family history, past medical history, past social history, past surgical history and problem list. Problem list updated.  Objective:   Vitals:   12/22/17 1531  BP: 121/79  Pulse: (!) 111  Weight: 225 lb (102.1 kg)    Fetal Status: Fetal Heart Rate (bpm): 150   Movement: Present     General:  Alert, oriented and cooperative. Patient is in no acute distress.  Skin: Skin is warm and dry. No rash noted.   Cardiovascular: Normal heart rate noted  Respiratory: Normal respiratory effort, no problems with respiration noted  Abdomen: Soft, gravid, appropriate for gestational age.  Pain/Pressure: Present     Pelvic: Cervical exam deferred        Extremities: Normal range of motion.     Mental Status: Normal mood and affect. Normal behavior. Normal judgment and thought content.   Assessment and Plan:  Pregnancy: G1P0 at 351w6d1. Supervision of high risk pregnancy, antepartum  2. Obesity affecting pregnancy,  antepartum  3. Rubella non-immune status, antepartum MMR pp  4. Smoking (tobacco) complicating pregnancy, second trimester  5. 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 Gestational diabetes mellitus (GDM) controlled on oral hypoglycemic drug, antepartum On glyburide 2.5 mg BID as patient has refused insulin in past, did not bring log today FG: 89-99, mostly 98-99 PP: 120 BPP: 8/8 yesterday For weekly testing, cont current regimen   Preterm labor symptoms and general obstetric precautions including but not limited to vaginal bleeding, contractions, leaking of fluid and fetal movement were reviewed in detail with the patient. Please refer to After Visit Summary for other counseling recommendations.  Return in about 1 week (around 12/29/2017) for OB visit (MD).  Future Appointments  Date Time Provider [place]11/20/2019  1:45 PM [place] [facility]  01/04/2018  1:15 PM [facility] [place] [facility]    KeSloan LeiterMD

## 2017-12-28 ENCOUNTER — Ambulatory Visit (HOSPITAL_COMMUNITY)
Admission: RE | Admit: 2017-12-28 | Discharge: 2017-12-28 | Disposition: A | Discharge status: Home / Self Care | Payer: Medicaid Other | Source: Ambulatory Visit | Attending: Obstetrics | Admitting: Obstetrics

## 2017-12-28 ENCOUNTER — Ambulatory Visit (INDEPENDENT_AMBULATORY_CARE_PROVIDER_SITE_OTHER): Payer: Medicaid Other | Admitting: Obstetrics and Gynecology

## 2017-12-28 ENCOUNTER — Encounter: Payer: Self-pay | Admitting: Obstetrics and Gynecology

## 2017-12-28 VITALS — BP 127/23 | HR 103 | Wt 225.0 lb

## 2017-12-28 DIAGNOSIS — Z3A37 37 weeks gestation of pregnancy: Secondary | ICD-10-CM | POA: Diagnosis not present

## 2017-12-28 DIAGNOSIS — O99333 Smoking (tobacco) complicating pregnancy, third trimester: Secondary | ICD-10-CM | POA: Diagnosis not present

## 2017-12-28 DIAGNOSIS — O0993 Supervision of high risk pregnancy, unspecified, third trimester: Secondary | ICD-10-CM

## 2017-12-28 DIAGNOSIS — O0990 Supervision of high risk pregnancy, unspecified, unspecified trimester: Secondary | ICD-10-CM

## 2017-12-28 DIAGNOSIS — O24415 Gestational diabetes mellitus in pregnancy, controlled by oral hypoglycemic drugs: Secondary | ICD-10-CM | POA: Insufficient documentation

## 2017-12-28 DIAGNOSIS — O99213 Obesity complicating pregnancy, third trimester: Secondary | ICD-10-CM | POA: Insufficient documentation

## 2017-12-28 NOTE — Progress Notes (Signed)
Subjective:  Beverly Torres is a 22 y.o. G1P0 at [redacted]w[redacted]d being seen today for ongoing prenatal care.  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  Patient reports general discomforts of pregnancy.  Contractions: Irregular. Vag. Bleeding: None.  Movement: Present. Denies leaking of fluid.   The following portions of the patient's history were reviewed and updated as appropriate: allergies, current medications, past family history, past medical history, past social history, past surgical history and problem list. Problem list updated.  Objective:   Vitals:   12/28/17 1424  BP: (!) 127/23  Pulse: (!) 103  Weight: 225 lb (102.1 kg)    Fetal Status:     Movement: Present     General:  Alert, oriented and cooperative. Patient is in no acute distress.  Skin: Skin is warm and dry. No rash noted.   Cardiovascular: Normal heart rate noted  Respiratory: Normal respiratory effort, no problems with respiration noted  Abdomen: Soft, gravid, appropriate for gestational age. Pain/Pressure: Present     Pelvic:  Cervical exam performed        Extremities: Normal range of motion.     Mental Status: Normal mood and affect. Normal behavior. Normal judgment and thought content.   Urinalysis:      Assessment and Plan:  Pregnancy: G1P0 at [redacted]w[redacted]d  1. Supervision of high risk pregnancy, antepartum Stable Labor precautions  2. Gestational diabetes  mellitus (GDM) controlled on oral hypoglycemic drug, antepartum BPP 8/8 yesterday RNST today CBG's in goal range Continue with medication and antenatal testing IOL at 39 weeks  Term labor symptoms and general obstetric precautions including but not limited to vaginal bleeding, contractions, leaking of fluid and fetal movement were reviewed in detail with the patient. Please refer to After Visit Summary for other counseling recommendations.  Return in about 1 week (around 01/04/2018) for OB visit.   Chancy Milroy, MD

## 2017-12-28 NOTE — Patient Instructions (Signed)
Vaginal Delivery Vaginal delivery means that you will give birth by pushing your baby out of your birth canal (vagina). A team of health care providers will help you before, during, and after vaginal delivery. Birth experiences are unique for every woman and every pregnancy, and birth experiences vary depending on where you choose to give birth. What should I do to prepare for my baby's birth? Before your baby is born, it is important to talk with your health care provider about:  Your labor and delivery preferences. These may include: ? Medicines that you may be given. ? How you will manage your pain. This might include non-medical pain relief techniques or injectable pain relief such as epidural analgesia. ? How you and your baby will be monitored during labor and delivery. ? Who may be in the labor and delivery room with you. ? Your feelings about surgical delivery of your baby (cesarean delivery, or C-section) if this becomes necessary. ? Your feelings about receiving donated blood through an IV tube (blood transfusion) if this becomes necessary.  Whether you are able: ? To take pictures or videos of the birth. ? To eat during labor and delivery. ? To move around, walk, or change positions during labor and delivery.  What to expect after your baby is born, such as: ? Whether delayed umbilical cord clamping and cutting is offered. ? Who will care for your baby right after birth. ? Medicines or tests that may be recommended for your baby. ? Whether breastfeeding is supported in your hospital or birth center. ? How long you will be in the hospital or birth center.  How any medical conditions you have may affect your baby or your labor and delivery experience.  To prepare for your baby's birth, you should also:  Attend all of your health care visits before delivery (prenatal visits) as recommended by your health care provider. This is important.  Prepare your home for your baby's  arrival. Make sure that you have: ? Diapers. ? Baby clothing. ? Feeding equipment. ? Safe sleeping arrangements for you and your baby.  Install a car seat in your vehicle. Have your car seat checked by a certified car seat installer to make sure that it is installed safely.  Think about who will help you with your new baby at home for at least the first several weeks after delivery.  What can I expect when I arrive at the birth center or hospital? Once you are in labor and have been admitted into the hospital or birth center, your health care provider may:  Review your pregnancy history and any concerns you have.  Insert an IV tube into one of your veins. This is used to give you fluids and medicines.  Check your blood pressure, pulse, temperature, and heart rate (vital signs).  Check whether your bag of water (amniotic sac) has broken (ruptured).  Talk with you about your birth plan and discuss pain control options.  Monitoring Your health care provider may monitor your contractions (uterine monitoring) and your baby's heart rate (fetal monitoring). You may need to be monitored:  Often, but not continuously (intermittently).  All the time or for long periods at a time (continuously). Continuous monitoring may be needed if: ? You are taking certain medicines, such as medicine to relieve pain or make your contractions stronger. ? You have pregnancy or labor complications.  Monitoring may be done by:  Placing a special stethoscope or a handheld monitoring device on your abdomen to   check your baby's heartbeat, and feeling your abdomen for contractions. This method of monitoring does not continuously record your baby's heartbeat or your contractions.  Placing monitors on your abdomen (external monitors) to record your baby's heartbeat and the frequency and length of contractions. You may not have to wear external monitors all the time.  Placing monitors inside of your uterus  (internal monitors) to record your baby's heartbeat and the frequency, length, and strength of your contractions. ? Your health care provider may use internal monitors if he or she needs more information about the strength of your contractions or your baby's heart rate. ? Internal monitors are put in place by passing a thin, flexible wire through your vagina and into your uterus. Depending on the type of monitor, it may remain in your uterus or on your baby's head until birth. ? Your health care provider will discuss the benefits and risks of internal monitoring with you and will ask for your permission before inserting the monitors.  Telemetry. This is a type of continuous monitoring that can be done with external or internal monitors. Instead of having to stay in bed, you are able to move around during telemetry. Ask your health care provider if telemetry is an option for you.  Physical exam Your health care provider may perform a physical exam. This may include:  Checking whether your baby is positioned: ? With the head toward your vagina (head-down). This is most common. ? With the head toward the top of your uterus (head-up or breech). If your baby is in a breech position, your health care provider may try to turn your baby to a head-down position so you can deliver vaginally. If it does not seem that your baby can be born vaginally, your provider may recommend surgery to deliver your baby. In rare cases, you may be able to deliver vaginally if your baby is head-up (breech delivery). ? Lying sideways (transverse). Babies that are lying sideways cannot be delivered vaginally.  Checking your cervix to determine: ? Whether it is thinning out (effacing). ? Whether it is opening up (dilating). ? How low your baby has moved into your birth canal.  What are the three stages of labor and delivery?  Normal labor and delivery is divided into the following three stages: Stage 1  Stage 1 is the  longest stage of labor, and it can last for hours or days. Stage 1 includes: ? Early labor. This is when contractions may be irregular, or regular and mild. Generally, early labor contractions are more than 10 minutes apart. ? Active labor. This is when contractions get longer, more regular, more frequent, and more intense. ? The transition phase. This is when contractions happen very close together, are very intense, and may last longer than during any other part of labor.  Contractions generally feel mild, infrequent, and irregular at first. They get stronger, more frequent (about every 2-3 minutes), and more regular as you progress from early labor through active labor and transition.  Many women progress through stage 1 naturally, but you may need help to continue making progress. If this happens, your health care provider may talk with you about: ? Rupturing your amniotic sac if it has not ruptured yet. ? Giving you medicine to help make your contractions stronger and more frequent.  Stage 1 ends when your cervix is completely dilated to 4 inches (10 cm) and completely effaced. This happens at the end of the transition phase. Stage 2  Once   your cervix is completely effaced and dilated to 4 inches (10 cm), you may start to feel an urge to push. It is common for the body to naturally take a rest before feeling the urge to push, especially if you received an epidural or certain other pain medicines. This rest period may last for up to 1-2 hours, depending on your unique labor experience.  During stage 2, contractions are generally less painful, because pushing helps relieve contraction pain. Instead of contraction pain, you may feel stretching and burning pain, especially when the widest part of your baby's head passes through the vaginal opening (crowning).  Your health care provider will closely monitor your pushing progress and your baby's progress through the vagina during stage 2.  Your  health care provider may massage the area of skin between your vaginal opening and anus (perineum) or apply warm compresses to your perineum. This helps it stretch as the baby's head starts to crown, which can help prevent perineal tearing. ? In some cases, an incision may be made in your perineum (episiotomy) to allow the baby to pass through the vaginal opening. An episiotomy helps to make the opening of the vagina larger to allow more room for the baby to fit through.  It is very important to breathe and focus so your health care provider can control the delivery of your baby's head. Your health care provider may have you decrease the intensity of your pushing, to help prevent perineal tearing.  After delivery of your baby's head, the shoulders and the rest of the body generally deliver very quickly and without difficulty.  Once your baby is delivered, the umbilical cord may be cut right away, or this may be delayed for 1-2 minutes, depending on your baby's health. This may vary among health care providers, hospitals, and birth centers.  If you and your baby are healthy enough, your baby may be placed on your chest or abdomen to help maintain the baby's temperature and to help you bond with each other. Some mothers and babies start breastfeeding at this time. Your health care team will dry your baby and help keep your baby warm during this time.  Your baby may need immediate care if he or she: ? Showed signs of distress during labor. ? Has a medical condition. ? Was born too early (prematurely). ? Had a bowel movement before birth (meconium). ? Shows signs of difficulty transitioning from being inside the uterus to being outside of the uterus. If you are planning to breastfeed, your health care team will help you begin a feeding. Stage 3  The third stage of labor starts immediately after the birth of your baby and ends after you deliver the placenta. The placenta is an organ that develops  during pregnancy to provide oxygen and nutrients to your baby in the womb.  Delivering the placenta may require some pushing, and you may have mild contractions. Breastfeeding can stimulate contractions to help you deliver the placenta.  After the placenta is delivered, your uterus should tighten (contract) and become firm. This helps to stop bleeding in your uterus. To help your uterus contract and to control bleeding, your health care provider may: ? Give you medicine by injection, through an IV tube, by mouth, or through your rectum (rectally). ? Massage your abdomen or perform a vaginal exam to remove any blood clots that are left in your uterus. ? Empty your bladder by placing a thin, flexible tube (catheter) into your bladder. ? Encourage   you to breastfeed your baby. After labor is over, you and your baby will be monitored closely to ensure that you are both healthy until you are ready to go home. Your health care team will teach you how to care for yourself and your baby. This information is not intended to replace advice given to you by your health care provider. Make sure you discuss any questions you have with your health care provider. Document Released: 11/04/2007 Document Revised: 08/15/2015 Document Reviewed: 02/09/2015 [employer] Interactive Patient Education  2018 [employer]  

## 2017-12-28 NOTE — Addendum Note (Signed)
Addended by: Maryruth Eve on: 12/28/2017 03:17 PM   Modules accepted: Orders

## 2017-12-28 NOTE — Progress Notes (Signed)
Pt presents for ROB. She didn't bring CBG log today. Requests cx check today.

## 2017-12-29 ENCOUNTER — Telehealth (HOSPITAL_COMMUNITY): Payer: Self-pay | Admitting: *Deleted

## 2017-12-29 NOTE — Telephone Encounter (Signed)
Preadmission screen  

## 2018-01-02 ENCOUNTER — Encounter (HOSPITAL_COMMUNITY): Payer: Self-pay | Admitting: *Deleted

## 2018-01-02 ENCOUNTER — Telehealth (HOSPITAL_COMMUNITY): Payer: Self-pay | Admitting: *Deleted

## 2018-01-02 NOTE — Telephone Encounter (Signed)
Preadmission screen  

## 2018-01-03 ENCOUNTER — Encounter (HOSPITAL_COMMUNITY): Payer: Self-pay

## 2018-01-03 ENCOUNTER — Inpatient Hospital Stay (HOSPITAL_COMMUNITY)
Admission: AD | Admit: 2018-01-03 | Discharge: 2018-01-03 | Disposition: A | Discharge status: Home / Self Care | Payer: Medicaid Other | Source: Ambulatory Visit | Attending: Obstetrics and Gynecology | Admitting: Obstetrics and Gynecology

## 2018-01-03 ENCOUNTER — Inpatient Hospital Stay (HOSPITAL_COMMUNITY)
Admission: AD | Admit: 2018-01-03 | Discharge: 2018-01-06 | DRG: 807 | Disposition: A | Discharge status: Home / Self Care | Payer: Medicaid Other | Attending: Obstetrics and Gynecology | Admitting: Obstetrics and Gynecology

## 2018-01-03 DIAGNOSIS — F419 Anxiety disorder, unspecified: Secondary | ICD-10-CM | POA: Diagnosis present

## 2018-01-03 DIAGNOSIS — Z91018 Allergy to other foods: Secondary | ICD-10-CM

## 2018-01-03 DIAGNOSIS — O99333 Smoking (tobacco) complicating pregnancy, third trimester: Secondary | ICD-10-CM | POA: Insufficient documentation

## 2018-01-03 DIAGNOSIS — Z833 Family history of diabetes mellitus: Secondary | ICD-10-CM

## 2018-01-03 DIAGNOSIS — O24113 Pre-existing diabetes mellitus, type 2, in pregnancy, third trimester: Secondary | ICD-10-CM | POA: Insufficient documentation

## 2018-01-03 DIAGNOSIS — Z3483 Encounter for supervision of other normal pregnancy, third trimester: Secondary | ICD-10-CM | POA: Diagnosis present

## 2018-01-03 DIAGNOSIS — O99332 Smoking (tobacco) complicating pregnancy, second trimester: Secondary | ICD-10-CM | POA: Diagnosis present

## 2018-01-03 DIAGNOSIS — Z283 Underimmunization status: Secondary | ICD-10-CM

## 2018-01-03 DIAGNOSIS — O99214 Obesity complicating childbirth: Secondary | ICD-10-CM | POA: Diagnosis present

## 2018-01-03 DIAGNOSIS — Z3689 Encounter for other specified antenatal screening: Secondary | ICD-10-CM

## 2018-01-03 DIAGNOSIS — O99213 Obesity complicating pregnancy, third trimester: Secondary | ICD-10-CM

## 2018-01-03 DIAGNOSIS — O24425 Gestational diabetes mellitus in childbirth, controlled by oral hypoglycemic drugs: Principal | ICD-10-CM | POA: Diagnosis present

## 2018-01-03 DIAGNOSIS — F329 Major depressive disorder, single episode, unspecified: Secondary | ICD-10-CM | POA: Insufficient documentation

## 2018-01-03 DIAGNOSIS — O24419 Gestational diabetes mellitus in pregnancy, unspecified control: Secondary | ICD-10-CM | POA: Diagnosis present

## 2018-01-03 DIAGNOSIS — O99210 Obesity complicating pregnancy, unspecified trimester: Secondary | ICD-10-CM

## 2018-01-03 DIAGNOSIS — F17210 Nicotine dependence, cigarettes, uncomplicated: Secondary | ICD-10-CM | POA: Diagnosis present

## 2018-01-03 DIAGNOSIS — O99343 Other mental disorders complicating pregnancy, third trimester: Secondary | ICD-10-CM

## 2018-01-03 DIAGNOSIS — O24414 Gestational diabetes mellitus in pregnancy, insulin controlled: Secondary | ICD-10-CM | POA: Diagnosis not present

## 2018-01-03 DIAGNOSIS — Z3A38 38 weeks gestation of pregnancy: Secondary | ICD-10-CM

## 2018-01-03 DIAGNOSIS — O09899 Supervision of other high risk pregnancies, unspecified trimester: Secondary | ICD-10-CM

## 2018-01-03 DIAGNOSIS — O9989 Other specified diseases and conditions complicating pregnancy, childbirth and the puerperium: Secondary | ICD-10-CM

## 2018-01-03 DIAGNOSIS — O99334 Smoking (tobacco) complicating childbirth: Secondary | ICD-10-CM | POA: Diagnosis present

## 2018-01-03 DIAGNOSIS — O0990 Supervision of high risk pregnancy, unspecified, unspecified trimester: Secondary | ICD-10-CM

## 2018-01-03 DIAGNOSIS — O479 False labor, unspecified: Secondary | ICD-10-CM | POA: Insufficient documentation

## 2018-01-03 DIAGNOSIS — Z818 Family history of other mental and behavioral disorders: Secondary | ICD-10-CM

## 2018-01-03 DIAGNOSIS — Z79899 Other long term (current) drug therapy: Secondary | ICD-10-CM | POA: Insufficient documentation

## 2018-01-03 DIAGNOSIS — Z2839 Other underimmunization status: Secondary | ICD-10-CM

## 2018-01-03 LAB — CBC
HCT: 36.3 % (ref 36.0–46.0)
Hemoglobin: 12.1 g/dL (ref 12.0–15.0)
MCH: 31.5 pg (ref 26.0–34.0)
MCHC: 33.3 g/dL (ref 30.0–36.0)
MCV: 94.5 fL (ref 80.0–100.0)
NRBC: 0 % (ref 0.0–0.2)
PLATELETS: 237 10*3/uL (ref 150–400)
RBC: 3.84 MIL/uL — AB (ref 3.87–5.11)
RDW: 13.5 % (ref 11.5–15.5)
WBC: 15.8 10*3/uL — AB (ref 4.0–10.5)

## 2018-01-03 LAB — POCT FERN TEST: POCT Fern Test: NEGATIVE

## 2018-01-03 NOTE — Discharge Instructions (Signed)
Braxton Hicks Contractions °Contractions of the uterus can occur throughout pregnancy, but they are not always a sign that you are in labor. You may have practice contractions called Braxton Hicks contractions. These false labor contractions are sometimes confused with true labor. °What are Braxton Hicks contractions? °Braxton Hicks contractions are tightening movements that occur in the muscles of the uterus before labor. Unlike true labor contractions, these contractions do not result in opening (dilation) and thinning of the cervix. Toward the end of pregnancy (32-34 weeks), Braxton Hicks contractions can happen more often and may become stronger. These contractions are sometimes difficult to tell apart from true labor because they can be very uncomfortable. You should not feel embarrassed if you go to the hospital with false labor. °Sometimes, the only way to tell if you are in true labor is for your health care provider to look for changes in the cervix. The health care provider will do a physical exam and may monitor your contractions. If you are not in true labor, the exam should show that your cervix is not dilating and your water has not broken. °If there are other health problems associated with your pregnancy, it is completely safe for you to be sent home with false labor. You may continue to have Braxton Hicks contractions until you go into true labor. °How to tell the difference between true labor and false labor °True labor °· Contractions last 30-70 seconds. °· Contractions become very regular. °· Discomfort is usually felt in the top of the uterus, and it spreads to the lower abdomen and low back. °· Contractions do not go away with walking. °· Contractions usually become more intense and increase in frequency. °· The cervix dilates and gets thinner. °False labor °· Contractions are usually shorter and not as strong as true labor contractions. °· Contractions are usually irregular. °· Contractions  are often felt in the front of the lower abdomen and in the groin. °· Contractions may go away when you walk around or change positions while lying down. °· Contractions get weaker and are shorter-lasting as time goes on. °· The cervix usually does not dilate or become thin. °Follow these instructions at home: °· Take over-the-counter and prescription medicines only as told by your health care provider. °· Keep up with your usual exercises and follow other instructions from your health care provider. °· Eat and drink lightly if you think you are going into labor. °· If Braxton Hicks contractions are making you uncomfortable: °? Change your position from lying down or resting to walking, or change from walking to resting. °? Sit and rest in a tub of warm water. °? Drink enough fluid to keep your urine pale yellow. Dehydration may cause these contractions. °? Do slow and deep breathing several times an hour. °· Keep all follow-up prenatal visits as told by your health care provider. This is important. °Contact a health care provider if: °· You have a fever. °· You have continuous pain in your abdomen. °Get help right away if: °· Your contractions become stronger, more regular, and closer together. °· You have fluid leaking or gushing from your vagina. °· You pass blood-tinged mucus (bloody show). °· You have bleeding from your vagina. °· You have low back pain that you never had before. °· You feel your baby’s head pushing down and causing pelvic pressure. °· Your baby is not moving inside you as much as it used to. °Summary °· Contractions that occur before labor are called Braxton   Hicks contractions, false labor, or practice contractions. °· Braxton Hicks contractions are usually shorter, weaker, farther apart, and less regular than true labor contractions. True labor contractions usually become progressively stronger and regular and they become more frequent. °· Manage discomfort from Braxton Hicks contractions by  changing position, resting in a warm bath, drinking plenty of water, or practicing deep breathing. °This information is not intended to replace advice given to you by your health care provider. Make sure you discuss any questions you have with your health care provider. °Document Released: 06/10/2016 Document Revised: 06/10/2016 Document Reviewed: 06/10/2016 °Elsevier Interactive Patient Education © 2018 [employer] Inc[employer] ° °

## 2018-01-03 NOTE — MAU Note (Signed)
Pt felt a gush of fluid after she used the bathroom this morning. Is not sure if she's still leaking. Having some mild ctx since this morning also.

## 2018-01-03 NOTE — MAU Note (Signed)
Pt here with c/o contractions; denies any leaking; having some bloody show. Was checked earlier today and was 4.5cm

## 2018-01-03 NOTE — MAU Provider Note (Signed)
History   [id number]   Chief Complaint  Patient presents with  . Rupture of Membranes    HPI Beverly Torres is a 22 y.o. female  G1P0 '@38'$ .4 wks here with report of leaking small amt of clear/bloody fluid this am.  Leaking of fluid has not continued. No gush. Pt reports contractions q4-5 min since 11pm last night. Last intercourse was not recent. She reports good fetal movement. All other systems negative.    Patient's last menstrual period was 04/08/2017 (exact date).  OB History  Gravida Para Term Preterm AB Living  1            SAB TAB Ectopic Multiple Live Births               # Outcome Date GA Lbr Len/2nd Weight Sex Delivery Anes PTL Lv  1 Current             Past Medical History:  Diagnosis Date  . Acid reflux   . Anxiety   . Attention deficit disorder   . Chronic chest pain   . Depression   . Diabetes mellitus without complication ([place])   . Gestational diabetes   . Morbid obesity with BMI of 40.0-44.9, adult ([place])   . Vaginal Pap smear, abnormal     Family History  Problem Relation Age of Onset  . Anxiety disorder Mother   . Drug abuse Father   . ADD / ADHD Father   . ADD / ADHD Sister   . Anxiety disorder Sister   . ADD / ADHD Brother   . ODD Brother   . Rheum arthritis Maternal Grandmother   . Kidney failure Maternal Grandmother   . Hypertension Maternal Grandfather   . Diabetes Maternal Grandfather   . Arthritis Maternal Grandfather   . Cancer Maternal Grandfather        Melanoma  . Diabetes Paternal Grandfather     Social History   Socioeconomic History  . Marital status: Married    Spouse name: Not on file  . Number of children: Not on file  . Years of education: Not on file  . Highest education level: Not on file  Occupational History  . Not on file  Social Needs  . Financial resource strain: Not hard at all  . Food insecurity:    Worry: Never true    Inability: Never true  . Transportation needs:    Medical: No   Non-medical: Not on file  Tobacco Use  . Smoking status: Light Tobacco Smoker    Packs/day: 0.25    Types: Cigarettes  . Smokeless tobacco: Never Used  Substance and Sexual Activity  . Alcohol use: No  . Drug use: No  . Sexual activity: Yes    Birth control/protection: None  Lifestyle  . Physical activity:    Days per week: Not on file    Minutes per session: Not on file  . Stress: Not at all  Relationships  . Social connections:    Talks on phone: Not on file    Gets together: Not on file    Attends religious service: Not on file    Active member of club or organization: Not on file    Attends meetings of clubs or organizations: Not on file    Relationship status: Not on file  Other Topics Concern  . Not on file  Social History Narrative  . Not on file    Allergies  Allergen Reactions  . Meat [Alpha-Gal]  Per allergist, Red meat per patient  . Strawberry (Diagnostic) Itching    No current facility-administered medications on file prior to encounter.    Current Outpatient Medications on File Prior to Encounter  Medication Sig Dispense Refill  . glyBURIDE (DIABETA) 2.5 MG tablet Take 1 tablet (2.5 mg total) by mouth 2 (two) times daily with a meal. 60 tablet 1  . Prenatal Vit-Fe Phos-FA-Omega (VITAFOL GUMMIES) 3.33-0.333-34.8 MG CHEW Chew 3 tablets by mouth at bedtime. 90 tablet 12  . ACCU-CHEK FASTCLIX LANCETS MISC 1 Stick by Percutaneous route 4 (four) times daily. 100 each 12  . aspirin EC 81 MG tablet Take 1 tablet (81 mg total) by mouth daily. (Patient not taking: Reported on 12/07/2017) 90 tablet 2  . Blood Glucose Monitoring Suppl (ACCU-CHEK GUIDE) w/Device KIT 1 Device by Percutaneous route 4 (four) times daily. 1 kit 0  . Elastic Bandages & Supports (COMFORT FIT MATERNITY SUPP LG) MISC 1 Units by Does not apply route daily. 1 each 0  . glucose blood (ACCU-CHEK GUIDE) test strip Use to check blood sugars four times a day 50 each 12  . ondansetron (ZOFRAN ODT)  4 MG disintegrating tablet Take 1 tablet (4 mg total) by mouth every 8 (eight) hours as needed for nausea or vomiting. (Patient not taking: Reported on 01/03/2018) 30 tablet 0  . Vitamin D, Ergocalciferol, (DRISDOL) 50000 units CAPS capsule Take 1 capsule (50,000 Units total) by mouth every 7 (seven) days. 30 capsule 2     Review of Systems  Gastrointestinal: Positive for abdominal pain.  Genitourinary: Positive for vaginal discharge.     Physical Exam   Vitals:   01/03/18 1129 01/03/18 1135 01/03/18 1136  BP:   130/61  Pulse:   (!) 118  Resp:  18   Temp:  98.4 F (36.9 C)   TempSrc:  Oral   Weight: 102.3 kg      Physical Exam  Constitutional: She is oriented to person, place, and time. She appears well-developed and well-nourished. No distress.  HENT:  Head: Normocephalic and atraumatic.  Neck: Normal range of motion.  Cardiovascular: Normal rate.  Respiratory: Effort normal. No respiratory distress.  GI: Soft. She exhibits no distension. There is no tenderness.  Genitourinary:  Genitourinary Comments: SSE: no pool, fern neg SVE 4/70/-2, vtx  Musculoskeletal: Normal range of motion.  Neurological: She is alert and oriented to person, place, and time.  Skin: Skin is warm and dry.  Psychiatric: She has a normal mood and affect.  EFM: 135 bpm, mod variability, + accels, no decels Toco: irregular  Results for orders placed or performed during the hospital encounter of 01/03/18 (from the past 24 hour(s))  POCT fern test     Status: None   Collection Time: 01/03/18 11:49 AM  Result Value Ref Range   POCT Fern Test Negative = intact amniotic membranes     MAU Course  Procedures  MDM Labs ordered and reviewed. No evidence of ROM. Cervix rechecked and no change. Stable for discharge home.   Assessment and Plan   1. [redacted] weeks gestation of pregnancy   2. Obesity affecting pregnancy, antepartum   3. Supervision of high risk pregnancy, antepartum   4. NST (non-stress  test) reactive   5. False labor    Discharge home Follow up in [facility] office tomorrow as scheduled Labor precautions  Allergies as of 01/03/2018      Reactions   Meat [alpha-gal]    Per allergist, Red meat per patient  Strawberry (diagnostic) Itching      Medication List    STOP taking these medications   ondansetron 4 MG disintegrating tablet Commonly known as:  ZOFRAN-ODT     TAKE these medications   ACCU-CHEK FASTCLIX LANCETS Misc 1 Stick by Percutaneous route 4 (four) times daily.   ACCU-CHEK GUIDE w/Device Kit 1 Device by Percutaneous route 4 (four) times daily.   aspirin EC 81 MG tablet Take 1 tablet (81 mg total) by mouth daily.   COMFORT FIT MATERNITY SUPP LG Misc 1 Units by Does not apply route daily.   glucose blood test strip Use to check blood sugars four times a day   glyBURIDE 2.5 MG tablet Commonly known as:  DIABETA Take 1 tablet (2.5 mg total) by mouth 2 (two) times daily with a meal.   VITAFOL GUMMIES 3.33-0.333-34.8 MG Chew Chew 3 tablets by mouth at bedtime.   Vitamin D (Ergocalciferol) 1.25 MG (50000 UT) Caps capsule Commonly known as:  DRISDOL Take 1 capsule (50,000 Units total) by mouth every 7 (seven) days.      Julianne Handler, CNM 01/03/2018 12:52 PM

## 2018-01-04 ENCOUNTER — Encounter: Payer: Medicaid Other | Admitting: Obstetrics and Gynecology

## 2018-01-04 ENCOUNTER — Inpatient Hospital Stay (HOSPITAL_COMMUNITY): Payer: Medicaid Other | Admitting: Anesthesiology

## 2018-01-04 ENCOUNTER — Encounter (HOSPITAL_COMMUNITY): Payer: Self-pay | Admitting: *Deleted

## 2018-01-04 ENCOUNTER — Other Ambulatory Visit: Payer: Self-pay

## 2018-01-04 ENCOUNTER — Ambulatory Visit (HOSPITAL_COMMUNITY): Admission: RE | Admit: 2018-01-04 | Payer: Medicaid Other | Source: Ambulatory Visit

## 2018-01-04 DIAGNOSIS — O24414 Gestational diabetes mellitus in pregnancy, insulin controlled: Secondary | ICD-10-CM

## 2018-01-04 DIAGNOSIS — Z3A38 38 weeks gestation of pregnancy: Secondary | ICD-10-CM

## 2018-01-04 LAB — GLUCOSE, CAPILLARY
GLUCOSE-CAPILLARY: 162 mg/dL — AB (ref 70–99)
GLUCOSE-CAPILLARY: 144 mg/dL — AB (ref 70–99)
GLUCOSE-CAPILLARY: 143 mg/dL — AB (ref 70–99)
GLUCOSE-CAPILLARY: 123 mg/dL — AB (ref 70–99)
GLUCOSE-CAPILLARY: 117 mg/dL — AB (ref 70–99)
GLUCOSE-CAPILLARY: 105 mg/dL — AB (ref 70–99)
GLUCOSE-CAPILLARY: 101 mg/dL — AB (ref 70–99)
Glucose-Capillary: 139 mg/dL — ABNORMAL HIGH (ref 70–99)
Glucose-Capillary: 190 mg/dL — ABNORMAL HIGH (ref 70–99)
Glucose-Capillary: 172 mg/dL — ABNORMAL HIGH (ref 70–99)
Glucose-Capillary: 155 mg/dL — ABNORMAL HIGH (ref 70–99)
Glucose-Capillary: 95 mg/dL (ref 70–99)
Glucose-Capillary: 100 mg/dL — ABNORMAL HIGH (ref 70–99)

## 2018-01-04 LAB — GLUCOSE, RANDOM: GLUCOSE: 162 mg/dL — AB (ref 70–99)

## 2018-01-04 LAB — RPR: RPR Ser Ql: NONREACTIVE

## 2018-01-04 LAB — TYPE AND SCREEN
ABO/RH(D): A POS
ANTIBODY SCREEN: NEGATIVE

## 2018-01-04 MED ORDER — SENNOSIDES-DOCUSATE SODIUM 8.6-50 MG PO TABS
2.0000 | ORAL_TABLET | ORAL | Status: DC
  Administered 2018-01-04 – 2018-01-05 (×2): 2 via ORAL
  Filled 2018-01-04 (×2): qty 2

## 2018-01-04 MED ORDER — LIDOCAINE HCL (PF) 1 % IJ SOLN
30.0000 mL | INTRAMUSCULAR | Status: DC | PRN
  Filled 2018-01-04: qty 30

## 2018-01-04 MED ORDER — PHENYLEPHRINE 40 MCG/ML (10ML) SYRINGE FOR IV PUSH (FOR BLOOD PRESSURE SUPPORT)
80.0000 ug | PREFILLED_SYRINGE | INTRAVENOUS | Status: DC | PRN
  Filled 2018-01-04: qty 5
  Filled 2018-01-04: qty 10

## 2018-01-04 MED ORDER — ONDANSETRON HCL 4 MG PO TABS: 4.0000 mg | ORAL_TABLET | ORAL | Status: DC | PRN

## 2018-01-04 MED ORDER — TERBUTALINE SULFATE 1 MG/ML IJ SOLN
0.2500 mg | Freq: Once | INTRAMUSCULAR | Status: DC | PRN
  Filled 2018-01-04: qty 1

## 2018-01-04 MED ORDER — WITCH HAZEL-GLYCERIN EX PADS: 1.0000 "application " | MEDICATED_PAD | CUTANEOUS | Status: DC | PRN

## 2018-01-04 MED ORDER — IBUPROFEN 600 MG PO TABS
600.0000 mg | ORAL_TABLET | Freq: Four times a day (QID) | ORAL | Status: DC
  Administered 2018-01-04 – 2018-01-06 (×7): 600 mg via ORAL
  Filled 2018-01-04 (×7): qty 1

## 2018-01-04 MED ORDER — FENTANYL CITRATE (PF) 100 MCG/2ML IJ SOLN
100.0000 ug | INTRAMUSCULAR | Status: DC | PRN
  Administered 2018-01-04 (×5): 100 ug via INTRAVENOUS
  Filled 2018-01-04 (×4): qty 2

## 2018-01-04 MED ORDER — LACTATED RINGERS IV SOLN
500.0000 mL | INTRAVENOUS | Status: DC | PRN
  Administered 2018-01-04: 500 mL via INTRAVENOUS

## 2018-01-04 MED ORDER — BENZOCAINE-MENTHOL 20-0.5 % EX AERO
1.0000 "application " | INHALATION_SPRAY | CUTANEOUS | Status: DC | PRN
  Administered 2018-01-06: 1 via TOPICAL
  Filled 2018-01-04: qty 56

## 2018-01-04 MED ORDER — OXYTOCIN 40 UNITS IN LACTATED RINGERS INFUSION - SIMPLE MED
2.5000 [IU]/h | INTRAVENOUS | Status: DC
  Filled 2018-01-04: qty 1000

## 2018-01-04 MED ORDER — FENTANYL 2.5 MCG/ML BUPIVACAINE 1/10 % EPIDURAL INFUSION (WH - ANES)
14.0000 mL/h | INTRAMUSCULAR | Status: DC | PRN
  Administered 2018-01-04: 14 mL/h via EPIDURAL
  Filled 2018-01-04: qty 100

## 2018-01-04 MED ORDER — DIPHENHYDRAMINE HCL 25 MG PO CAPS: 25.0000 mg | ORAL_CAPSULE | Freq: Four times a day (QID) | ORAL | Status: DC | PRN

## 2018-01-04 MED ORDER — EPHEDRINE 5 MG/ML INJ
10.0000 mg | INTRAVENOUS | Status: DC | PRN
  Filled 2018-01-04: qty 2

## 2018-01-04 MED ORDER — OXYTOCIN BOLUS FROM INFUSION
500.0000 mL | Freq: Once | INTRAVENOUS | Status: AC
  Administered 2018-01-04: 500 mL via INTRAVENOUS

## 2018-01-04 MED ORDER — COCONUT OIL OIL: 1.0000 "application " | TOPICAL_OIL | Status: DC | PRN

## 2018-01-04 MED ORDER — PHENYLEPHRINE 40 MCG/ML (10ML) SYRINGE FOR IV PUSH (FOR BLOOD PRESSURE SUPPORT)
80.0000 ug | PREFILLED_SYRINGE | INTRAVENOUS | Status: DC | PRN
  Filled 2018-01-04: qty 5

## 2018-01-04 MED ORDER — SOD CITRATE-CITRIC ACID 500-334 MG/5ML PO SOLN: 30.0000 mL | ORAL | Status: DC | PRN

## 2018-01-04 MED ORDER — PRENATAL MULTIVITAMIN CH
1.0000 | ORAL_TABLET | Freq: Every day | ORAL | Status: DC
  Administered 2018-01-05 – 2018-01-06 (×2): 1 via ORAL
  Filled 2018-01-04 (×2): qty 1

## 2018-01-04 MED ORDER — DIBUCAINE 1 % RE OINT: 1.0000 "application " | TOPICAL_OINTMENT | RECTAL | Status: DC | PRN

## 2018-01-04 MED ORDER — ONDANSETRON HCL 4 MG/2ML IJ SOLN: 4.0000 mg | INTRAMUSCULAR | Status: DC | PRN

## 2018-01-04 MED ORDER — OXYTOCIN 40 UNITS IN LACTATED RINGERS INFUSION - SIMPLE MED
1.0000 m[IU]/min | INTRAVENOUS | Status: DC
  Administered 2018-01-04: 2 m[IU]/min via INTRAVENOUS

## 2018-01-04 MED ORDER — ONDANSETRON HCL 4 MG/2ML IJ SOLN
4.0000 mg | Freq: Four times a day (QID) | INTRAMUSCULAR | Status: DC | PRN
  Administered 2018-01-04: 4 mg via INTRAVENOUS
  Filled 2018-01-04: qty 2

## 2018-01-04 MED ORDER — LACTATED RINGERS IV SOLN
500.0000 mL | Freq: Once | INTRAVENOUS | Status: AC
  Administered 2018-01-04: 500 mL via INTRAVENOUS

## 2018-01-04 MED ORDER — LIDOCAINE HCL (PF) 1 % IJ SOLN
INTRAMUSCULAR | Status: DC | PRN
  Administered 2018-01-04 (×2): 6 mL via EPIDURAL

## 2018-01-04 MED ORDER — DEXTROSE IN LACTATED RINGERS 5 % IV SOLN
INTRAVENOUS | Status: DC
  Administered 2018-01-04 (×2): via INTRAVENOUS

## 2018-01-04 MED ORDER — ACETAMINOPHEN 325 MG PO TABS: 650.0000 mg | ORAL_TABLET | ORAL | Status: DC | PRN

## 2018-01-04 MED ORDER — FLEET ENEMA 7-19 GM/118ML RE ENEM: 1.0000 | ENEMA | RECTAL | Status: DC | PRN

## 2018-01-04 MED ORDER — SIMETHICONE 80 MG PO CHEW
80.0000 mg | CHEWABLE_TABLET | ORAL | Status: DC | PRN
  Administered 2018-01-06: 80 mg via ORAL
  Filled 2018-01-04: qty 1

## 2018-01-04 MED ORDER — INSULIN REGULAR(HUMAN) IN NACL 100-0.9 UT/100ML-% IV SOLN
INTRAVENOUS | Status: DC
  Administered 2018-01-04: 1.1 [IU]/h via INTRAVENOUS
  Filled 2018-01-04: qty 100

## 2018-01-04 MED ORDER — LACTATED RINGERS IV SOLN
INTRAVENOUS | Status: DC
  Administered 2018-01-04: 02:00:00 via INTRAVENOUS

## 2018-01-04 MED ORDER — TETANUS-DIPHTH-ACELL PERTUSSIS 5-2.5-18.5 LF-MCG/0.5 IM SUSP: 0.5000 mL | Freq: Once | INTRAMUSCULAR | Status: DC

## 2018-01-04 MED ORDER — FENTANYL CITRATE (PF) 100 MCG/2ML IJ SOLN
INTRAMUSCULAR | Status: AC
  Administered 2018-01-04: 100 ug via INTRAVENOUS
  Filled 2018-01-04: qty 2

## 2018-01-04 MED ORDER — DIPHENHYDRAMINE HCL 50 MG/ML IJ SOLN: 12.5000 mg | INTRAMUSCULAR | Status: DC | PRN

## 2018-01-04 MED ORDER — ZOLPIDEM TARTRATE 5 MG PO TABS: 5.0000 mg | ORAL_TABLET | Freq: Every evening | ORAL | Status: DC | PRN

## 2018-01-04 NOTE — Progress Notes (Signed)
8 grams carbs intake, 0.8 units covered with glucostabilizer

## 2018-01-04 NOTE — Progress Notes (Addendum)
LABOR PROGRESS NOTE  Beverly Torres is a 22 y.o. G1P0 at [redacted]w[redacted]d  admitted for SOL, GDM during pregnancy on Glyburide   Subjective: Patient doing well, breathing through contractions- tolerating pain well   Objective: BP (!) 114/49   Pulse 88   Temp 98.2 F (36.8 C) (Oral)   Resp 16   Ht 5\' 2"  (1.575 m)   Wt 102.5 kg   LMP 04/08/2017 (Exact Date)   SpO2 99%   BMI 41.34 kg/m  or  Vitals:   01/03/18 2037 01/04/18 0234 01/04/18 0429  BP: 129/63 133/64 (!) 114/49  Pulse: (!) 108 (!) 102 88  Resp: 18 16   Temp: 98.3 F (36.8 C) 98.2 F (36.8 C)   TempSrc: Oral Oral   SpO2: 99%    Weight: 102.5 kg 102.5 kg   Height: 5\' 2"  (1.575 m) 5\' 2"  (1.575 m)    Glucose, capillary [[id number]] (Abnormal) Collected: 01/04/18 0621   Updated: 01/04/18 0624   Glucose-Capillary 190High  mg/dL   Glucose, capillary [[id number]] (Abnormal) Collected: 01/04/18 0238   Updated: 01/04/18 0240   Glucose-Capillary 139High  mg/dL     AROM @ 4627- clear fluid  Dilation: 7 Effacement (%): 100 Cervical Position: Anterior Station: -1 Presentation: Vertex Exam by:: Wende Bushy, CNM FHT: baseline rate 135, moderate varibility, +accel, no decel Toco: 3-4 minutes by palpation/ moderate   Labs: Lab Results  Component Value Date   WBC 15.8 (H) 01/03/2018   HGB 12.1 01/03/2018   HCT 36.3 01/03/2018   MCV 94.5 01/03/2018   PLT 237 01/03/2018    Patient Active Problem List   Diagnosis Date Noted  . Indication for care in labor or delivery 01/03/2018  . Gestational diabetes mellitus, antepartum 10/28/2017  . Obesity affecting pregnancy, antepartum 07/20/2017  . Rubella non-immune status, antepartum 07/18/2017  . Vitamin D deficiency 07/18/2017  . 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 Smoking (tobacco) complicating pregnancy, second trimester 07/13/2017  . Morbid obesity ([place]) 07/13/2017  . Supervision of high risk pregnancy, antepartum  07/12/2017  . Schizophrenia, acute undifferentiated ([place]) 08/27/2016  . Anxiety disorder 08/21/2016  . Anxiety 05/02/2012  . ADHD (attention deficit hyperactivity disorder), combined type 03/28/2012    Assessment / Plan: 22 y.o. G1P0 at [redacted]w[redacted]d here for SOL  Labor: AROM, expectant management- possible pitocin augmentation if no cervical change in 4 hours  Fetal Wellbeing:  Cat I Pain Control:  IV Fentanyl PRN  Anticipated MOD:  SVD #GDM: CBG 139 then 4 hours later 190, Glucostabilizer initiated.   Lajean Manes, CNM 01/04/2018, 6:46 AM

## 2018-01-04 NOTE — Anesthesia Preprocedure Evaluation (Signed)
Anesthesia Evaluation  Patient identified by MRN, date of birth, ID band Patient awake    Reviewed: Allergy & Precautions, NPO status , Patient's Chart, lab work & pertinent test results  Airway Mallampati: II       Dental no notable dental hx. (+) Teeth Intact   Pulmonary Current Smoker,    Pulmonary exam normal breath sounds clear to auscultation       Cardiovascular Normal cardiovascular exam Rhythm:Regular Rate:Normal     Neuro/Psych PSYCHIATRIC DISORDERS Anxiety Depression Bipolar Disorder Schizophrenia    GI/Hepatic Neg liver ROS,   Endo/Other  diabetes, Gestational, Oral Hypoglycemic AgentsMorbid obesity  Renal/GU negative Renal ROS  negative genitourinary   Musculoskeletal   Abdominal (+) + obese,   Peds  Hematology   Anesthesia Other Findings   Reproductive/Obstetrics                             Anesthesia Physical Anesthesia Plan  ASA: II  Anesthesia Plan: Epidural   Post-op Pain Management:    Induction:   PONV Risk Score and Plan:   Airway Management Planned:   Additional Equipment:   Intra-op Plan:   Post-operative Plan:   Informed Consent: I have reviewed the patients History and Physical, chart, labs and discussed the procedure including the risks, benefits and alternatives for the proposed anesthesia with the patient or authorized representative who has indicated his/her understanding and acceptance.     Plan Discussed with:   Anesthesia Plan Comments:         Anesthesia Quick Evaluation

## 2018-01-04 NOTE — Anesthesia Procedure Notes (Signed)
Epidural Patient location during procedure: OB Start time: 01/04/2018 10:04 AM End time: 01/04/2018 10:08 AM  Staffing Anesthesiologist: Lyn Hollingshead, MD Performed: anesthesiologist   Preanesthetic Checklist Completed: patient identified, site marked, surgical consent, pre-op evaluation, timeout performed, IV checked, risks and benefits discussed and monitors and equipment checked  Epidural Patient position: sitting Prep: site prepped and draped and DuraPrep Patient monitoring: continuous pulse ox and blood pressure Approach: midline Location: L3-L4 Injection technique: LOR air  Needle:  Needle type: Tuohy  Needle gauge: 17 G Needle length: 9 cm and 9 Needle insertion depth: 9 cm Catheter type: closed end flexible Catheter size: 19 Gauge Catheter at skin depth: 14 cm Test dose: negative and Other  Assessment Sensory level: T9 Events: blood not aspirated, injection not painful, no injection resistance, negative IV test and no paresthesia  Additional Notes Reason for block:procedure for pain

## 2018-01-04 NOTE — Progress Notes (Signed)
Patient ID: Beverly Torres, female   DOB: 1995/06/18, 22 y.o.   MRN: [id number] Doing well now s/p epidural  Vitals:   01/04/18 1028 01/04/18 1033 01/04/18 1041 01/04/18 1046  BP: (!) 125/54 (!) 125/56 119/69 (!) 110/53  Pulse: 66 61 74 (!) 59  Resp:      Temp:      TempSrc:      SpO2: 96% 95%    Weight:      Height:       Fetal heart rate pattern reassuring. UCs q 6min  Dilation: 7 Effacement (%): 100 Cervical Position: Anterior Station: 0 Presentation: Vertex Exam by:: harmony stone, RN   Continue to observe

## 2018-01-04 NOTE — H&P (Signed)
LABOR AND DELIVERY ADMISSION HISTORY AND PHYSICAL NOTE  Beverly Torres is a 22 y.o. female G1P0 with IUP at 41w5dby LMP presenting for SOL. She reports positive fetal movement. She reports vaginal bleeding- bloody show. She denies leakage of fluid.  [facility] History[facility]Com[facility]:  - Smoking complicating pregnancy  - Morbid obesity  - GDM on Glyburide  - Rubella non immune status  - Anxiety disorder  - LSIL   Past Medical History: Past Medical History:  Diagnosis Date  . Acid reflux   . Anxiety   . Attention deficit disorder   . Chronic chest pain   . Depression   . Diabetes mellitus without complication ([facility]   . Gestational diabetes   . Morbid obesity with BMI of 40.0-44.9, adult ([facility]   . Vaginal Pap smear, abnormal     Past Surgical History: Past Surgical History:  Procedure Laterality Date  . ADENOIDECTOMY    . TONSILLECTOMY    . WISDOM TOOTH EXTRACTION      Obstetrical History: OB History    Gravida  1   Para      Term      Preterm      AB      Living        SAB      TAB      Ectopic      Multiple      Live Births              Social History: Social History   Socioeconomic History  . Marital status: Married    Spouse name: Not on file  . Number of children: Not on file  . Years of education: Not on file  . Highest education level: Not on file  Occupational History  . Not on file  Social Needs  . Financial resource strain: Not hard at all  . Food insecurity:    Worry: Never true    Inability: Never true  . Transportation needs:    Medical: No    Non-medical: Not on file  Tobacco Use  . Smoking status: Light Tobacco Smoker    Packs/day: 0.25    Years: 8.00    Pack years: 2.00    Types: Cigarettes  . Smokeless tobacco: Never Used  Substance and Sexual Activity  . Alcohol use: No  . Drug use: No  . Sexual activity: Yes    Birth control/protection: None  Lifestyle  . Physical  activity:    Days per week: Not on file    Minutes per session: Not on file  . Stress: Not at all  Relationships  . Social connections:    Talks on phone: Not on file    Gets together: Not on file    Attends religious service: Not on file    Active member of club or organization: Not on file    Attends meetings of clubs or organizations: Not on file    Relationship status: Not on file  Other Topics Concern  . Not on file  Social History Narrative  . Not on file    Family History: Family History  Problem Relation Age of Onset  . Anxiety disorder Mother   . Drug abuse Father   . ADD / ADHD Father   . ADD / ADHD Sister   . Anxiety disorder Sister   . ADD / ADHD Brother   . ODD Brother   . Rheum arthritis Maternal Grandmother   .  Kidney failure Maternal Grandmother   . Hypertension Maternal Grandfather   . Diabetes Maternal Grandfather   . Arthritis Maternal Grandfather   . Cancer Maternal Grandfather        Melanoma  . Diabetes Paternal Grandfather     Allergies: Allergies  Allergen Reactions  . 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 Strawberry (Diagnostic) Itching    Medications Prior to Admission  Medication Sig Dispense Refill Last Dose  . ACCU-CHEK FASTCLIX LANCETS MISC 1 Stick by Percutaneous route 4 (four) times daily. 100 each 12 Taking  . aspirin EC 81 MG tablet Take 1 tablet (81 mg total) by mouth daily. (Patient not taking: Reported on 12/07/2017) 90 tablet 2 Not Taking at Unknown time  . Blood Glucose Monitoring Suppl (ACCU-CHEK GUIDE) w/Device KIT 1 Device by Percutaneous route 4 (four) times daily. 1 kit 0 Taking  . Elastic Bandages & Supports (COMFORT FIT MATERNITY SUPP LG) MISC 1 Units by Does not apply route daily. 1 each 0 Taking  . glucose blood (ACCU-CHEK GUIDE) test strip Use to check blood sugars four times a day 50 each 12 Taking  . glyBURIDE (DIABETA) 2.5 MG tablet Take 1 tablet (2.5 mg total) by mouth 2 (two) times daily  with a meal. 60 tablet 1 01/02/2018 at Unknown time  . Prenatal Vit-Fe Phos-FA-Omega (VITAFOL GUMMIES) 3.33-0.333-34.8 MG CHEW Chew 3 tablets by mouth at bedtime. 90 tablet 12 01/02/2018 at Unknown time  . Vitamin D, Ergocalciferol, (DRISDOL) 50000 units CAPS capsule Take 1 capsule (50,000 Units total) by mouth every 7 (seven) days. 30 capsule 2 12/22/2017     Review of Systems  All systems reviewed and negative except as stated in HPI  Physical Exam Blood pressure (!) 114/49, pulse 88, temperature 98.2 F (36.8 C), temperature source Oral, resp. rate 16, height '5\' 2"'$  (1.575 m), weight 102.5 kg, last menstrual period 04/08/2017, SpO2 99 %. General appearance: alert, cooperative and no distress Lungs: clear to auscultation bilaterally Heart: regular rate and rhythm Abdomen: soft, non-tender; bowel sounds normal Extremities: No calf swelling or tenderness Presentation: cephalic by cervical examination  Fetal monitoring: 135/moderate/+accels no decelerations  Uterine activity: 3-4 minutes/ moderate by palpation  Dilation: 6.5 Effacement (%): 100 Station: -1 Exam by:: TLYTLE RN  Prenatal labs: ABO, Rh: --/--/A POS (11/26 2344) Antibody: NEG (11/26 2344) Rubella: <0.90 (06/05 1437) RPR: Non Reactive (09/19 1022)  HBsAg: Negative (06/05 1437)  HIV: Non Reactive (09/19 1022)  GC/Chlamydia: Negative (11/08) GBS: Negative (11/08 1021)  2 hr Glucola: [phone], abnormal fasting  Genetic screening:  Negative  Anatomy US: normal   Nursing Staff Provider  Office Location  [facility] Dating  LMP  Language  English Anatomy US    Flu Vaccine   10/05/17 Genetic Screen  NIPS:   AFP:Neg    TDaP vaccine   11/23/17 Hgb A1C or  GTT Early A1C: normal Third trimester  Abnormal>GDM  Rhogam  n/a   LAB RESULTS   Feeding Plan both Blood Type A/Positive/-- (06/05 1437)   Contraception  Yes, pill Antibody Negative (06/05 1437)  Circumcision Girl  Rubella <0.90 (06/05 1437)  Pediatrician   [facility] RPR Non Reactive (09/19 1022)   Support Person  Marrah Vanevery HBsAg Negative (06/05 1437)   Prenatal Classes no HIV Non Reactive (09/19 1022)  BTL Consent  GBS Negative (11/08 1021)   VBAC Consent  Pap 07/13/17: LSIL suggestive of HSIL    Hgb Electro  Normal  CF Neg    SMA/Fragile X: Neg    Waterbirth  '[ ]'$  Class '[ ]'$  Consent '[ ]'$  CNM visit    Prenatal Transfer Tool  Maternal Diabetes: Yes:  Diabetes Type:  Insulin/Medication controlled Genetic Screening: Normal Maternal Ultrasounds/Referrals: Normal Fetal Ultrasounds or other Referrals:  None Maternal Substance Abuse:  No Significant Maternal Medications:  Meds include: Other: Glyburide Significant Maternal Lab Results: Lab values include: Group B Strep negative  Results for orders placed or performed during the hospital encounter of 01/03/18 (from the past 24 hour(s))  CBC   Collection Time: 01/03/18 11:44 PM  Result Value Ref Range   WBC 15.8 (H) 4.0 - 10.5 K/uL   RBC 3.84 (L) 3.87 - 5.11 MIL/uL   Hemoglobin 12.1 12.0 - 15.0 g/dL   HCT 36.3 36.0 - 46.0 %   MCV 94.5 80.0 - 100.0 fL   MCH 31.5 26.0 - 34.0 pg   MCHC 33.3 30.0 - 36.0 g/dL   RDW 13.5 11.5 - 15.5 %   Platelets 237 150 - 400 K/uL   nRBC 0.0 0.0 - 0.2 %  Type and screen [place]   Collection Time: 01/03/18 11:44 PM  Result Value Ref Range   ABO/RH(D) A POS    Antibody Screen NEG    Sample Expiration      01/06/2018 Performed at [facility], [address], [place], [place] [address]   Glucose, capillary   Collection Time: 01/04/18  2:38 AM  Result Value Ref Range   Glucose-Capillary 139 (H) 70 - 99 mg/dL  Results for orders placed or performed during the hospital encounter of 01/03/18 (from the past 24 hour(s))  POCT fern test   Collection Time: 01/03/18 11:49 AM  Result Value Ref Range   POCT Fern Test Negative = intact amniotic membranes     Patient Active Problem List   Diagnosis Date Noted  . Indication for care in  labor or delivery 01/03/2018  . Gestational diabetes mellitus, antepartum 10/28/2017  . Obesity affecting pregnancy, antepartum 07/20/2017  . Rubella non-immune status, antepartum 07/18/2017  . Vitamin D deficiency 07/18/2017  . 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 Smoking (tobacco) complicating pregnancy, second trimester 07/13/2017  . Morbid obesity ([place]) 07/13/2017  . Supervision of high risk pregnancy, antepartum 07/12/2017  . Schizophrenia, acute undifferentiated ([place]) 08/27/2016  . Anxiety disorder 08/21/2016  . Anxiety 05/02/2012  . ADHD (attention deficit hyperactivity disorder), combined type 03/28/2012    Assessment: DORRAINE ELLENDER is a 22 y.o. G1P0 at 71w5dhere for SOL  #Labor: Progressing well, expectant management  #Pain: Pain medication ordered PRN, does not want epidural at this time  #FWB: Cat I  #ID:  GBS negative  #MOF: Breast  #MOC:pills  #Circ:  n/a  RLajean Manes CNM 01/04/2018, 5:03 AM

## 2018-01-04 NOTE — Anesthesia Pain Management Evaluation Note (Signed)
  CRNA Pain Management Visit Note  Patient: Beverly Torres, 22 y.o., female  "Hello I am a member of the anesthesia team at [facility]. We have an anesthesia team available at all times to provide care throughout the hospital, including epidural management and anesthesia for C-section. I don't know your plan for the delivery whether it a natural birth, water birth, IV sedation, nitrous supplementation, doula or epidural, but we want to meet your pain goals."   1.Was your pain managed to your expectations on prior hospitalizations?   No prior hospitalizations  2.What is your expectation for pain management during this hospitalization?     Epidural  3.How can we help you reach that goal? Be available, epidural infusing  Record the patient's initial score and the patient's pain goal.   Pain: 2  Pain Goal: 5 [facility] wants you to be able to say your pain was always managed very well.  [facility] 01/04/2018

## 2018-01-05 NOTE — Anesthesia Postprocedure Evaluation (Signed)
Anesthesia Post Note  Patient: Beverly Torres  Procedure(s) Performed: AN AD [place]     Patient location during evaluation: Mother Baby Anesthesia Type: Epidural Level of consciousness: awake Pain management: pain level controlled Vital Signs Assessment: post-procedure vital signs reviewed and stable Respiratory status: spontaneous breathing Cardiovascular status: stable Postop Assessment: patient able to bend at knees, epidural receding, no backache and no headache Anesthetic complications: no    Last Vitals:  Vitals:   01/05/18 0205 01/05/18 0400  BP: 109/63 110/64  Pulse: 66 65  Resp: 18 16  Temp: 36.7 C 36.8 C  SpO2:      Last Pain:  Vitals:   01/05/18 0400  TempSrc: Oral  PainSc: 0-No pain   Pain Goal: Patients Stated Pain Goal: 7 (01/04/18 0234)               Everette Rank

## 2018-01-05 NOTE — Progress Notes (Signed)
CSW acknowledges consult.  CSW attempted to meet with MOB, however MOB had several room guest and bedside nurse was attending to family.  CSW will attempt to visit with MOB  On tomorrow.   Laurey Arrow, MSW, LCSW Clinical Social Work [phone]

## 2018-01-05 NOTE — Progress Notes (Signed)
Parent request formula to supplement breast feeding due to low blood sugars and fatigue.Parents have been informed of small tummy size of newborn, taught hand expression and understand the possible consequences of formula to the health of the infant. The possible consequences shared with patient include 1) Loss of confidence in breastfeeding 2) Engorgement 3) Allergic sensitization of baby(asthma/allergies) and 4) decreased milk supply for mother.After discussion of the above the mother decided to  supplement with formula.  The tool used to give formula supplement will be slow flow nipple.

## 2018-01-05 NOTE — Progress Notes (Signed)
Post Partum Day 1 Subjective: no complaints, up ad lib, voiding, tolerating PO and + flatus  Objective: Blood pressure 110/64, pulse 65, temperature 98.2 F (36.8 C), temperature source Oral, resp. rate 16, height 5\' 2"  (1.575 m), weight 102.5 kg, last menstrual period 04/08/2017, SpO2 100 %, unknown if currently breastfeeding.  Physical Exam:  General: alert, cooperative, appears stated age, fatigued and no distress Lochia: appropriate Uterine Fundus: firm Incision: NA DVT Evaluation: No evidence of DVT seen on physical exam.  Recent Labs    01/03/18 2344  HGB 12.1  HCT 36.3    Assessment/Plan: Plan for discharge tomorrow and Breastfeeding and bottle feeding   LOS: 2 days   Aura Camps 01/05/2018, 8:10 AM

## 2018-01-05 NOTE — Lactation Note (Signed)
This note was copied from a baby's chart. Lactation Consultation Note  Patient Name: Beverly Torres [id number] Date: 01/05/2018 Reason for consult: Initial assessment;Early term 37-38.6wks P1, 8 hour female infant. Per mom, attended  a BF class at the [facility] office in [facility]. Mom does not have a breast pump at home. LC gave mom harmony hand pump and explained how to assemble, reassemble, clean and store breast milk.  BF concerns: Infant reluctant to latch at this time. Mom w/ hx of GDM, anxiety , schizophrenia and depression. LC enter the  room   as family members were leaving. Mom attempted latch infant on left breast using cross cradle position, infant reluctant to latch at this time to sleepy. Mom hand expressed 16 ml of colostrum easily, per mom she been leaking since she was [redacted] weeks gestation. Infant was spoon fed 8 ml of colostrum. Mom will attempt latch infant at next feed and give back 8 ml of EBM afterwards. LC discussed I & O. Reviewed Baby & Me book's Breastfeeding Basics.  Mom made aware of O/P services, breastfeeding support groups, community resources, and our phone # for post-discharge questions.  BF plans: 1. Mom will breastfeed according hunger cues and not exceed 3 hours without feeding infant. 2. Mom will continue to work on latching infant to breast and will give infant EBM for volume. 3. Mom will continue hand express and give back EBM. 4. Parents will continue to do STS as much as possible. 67. Mom knows to ask for Nurse or [place] to help assist with latching infant to breast.    Mom will continue working towards latching infant to breast. Maternal Data Formula Feeding for Exclusion: No Has patient been taught Hand Expression?: Yes(Mom hand expressed 16 ml of colostrum.) Does the patient have breastfeeding experience prior to this delivery?: No  Feeding Feeding Type: Breast Fed  LATCH Score Latch: Too sleepy or reluctant, no latch achieved, no sucking  elicited.  Audible Swallowing: None  Type of Nipple: Everted at rest and after stimulation  Comfort (Breast/Nipple): Soft / non-tender  Hold (Positioning): Assistance needed to correctly position infant at breast and maintain latch.  LATCH Score: 5  Interventions Interventions: Breast feeding basics reviewed;Assisted with latch;Breast compression;Adjust position;Skin to skin;Support pillows;Breast massage;Hand express;Position options;Hand pump;Expressed milk  Lactation Tools Discussed/Used WIC Program: Yes Pump Review: Setup, frequency, and cleaning;Milk Storage Initiated by:: Vicente Serene, IBCLC Date initiated:: 01/05/18   Consult Status Consult Status: Follow-up Date: 01/05/18 Follow-up type: In-patient    Vicente Serene 01/05/2018, 1:10 AM

## 2018-01-06 ENCOUNTER — Ambulatory Visit: Payer: Self-pay

## 2018-01-06 ENCOUNTER — Inpatient Hospital Stay (HOSPITAL_COMMUNITY): Admission: RE | Admit: 2018-01-06 | Payer: Medicaid Other | Source: Ambulatory Visit

## 2018-01-06 MED ORDER — IBUPROFEN 600 MG PO TABS: 600.0000 mg | ORAL_TABLET | Freq: Four times a day (QID) | ORAL | 0 refills | Status: DC

## 2018-01-06 MED ORDER — MEASLES, MUMPS & RUBELLA VAC IJ SOLR
0.5000 mL | Freq: Once | INTRAMUSCULAR | Status: AC
  Administered 2018-01-06: 0.5 mL via SUBCUTANEOUS
  Filled 2018-01-06: qty 0.5

## 2018-01-06 NOTE — Lactation Note (Signed)
This note was copied from a baby's chart. Lactation Consultation Note Gave green nipples and feeding amount according to hours of age. Mom stated feeding increasing.  Patient Name: Beverly Torres [id number] Date: 01/06/2018     Maternal Data    Feeding Feeding Type: Formula Nipple Type: Slow - flow  LATCH Score                   Interventions    Lactation Tools Discussed/Used     Consult Status      Jonel Sick G 01/06/2018, 10:14 PM

## 2018-01-06 NOTE — Clinical Social Work Maternal (Signed)
CLINICAL SOCIAL WORK MATERNAL/CHILD NOTE  Patient Details  Name: Beverly Torres MRN: [id number] Date of Birth: 07/30/1995  Date:  01/06/2018  Clinical Social Worker Initiating Note:  Shaneka Efaw Boyd-Gilyard Date/Time: Initiated:  01/06/18/1503     Child's Name:  Beverly Torres   Biological Parents:  Mother, Father   Need for Interpreter:  None   Reason for Referral:  Behavioral Health Concerns   Address:  [address] [place] [address]    Phone number:  [phone] (home)     Additional phone number:  Household Members/Support Persons (HM/SP):   Household Member/Support Person 1(MOB and FOB resides with MOB's mother. )   HM/SP Name Relationship DOB or Age  HM/SP -1 Beverly Torres FOB/Husband 11/17/1995  HM/SP -2        HM/SP -3        HM/SP -4        HM/SP -5        HM/SP -6        HM/SP -7        HM/SP -8          Natural Supports (not living in the home):  Immediate Family, Extended Family, Friends, Parent   Professional Supports: Therapist(MOB is an established patient at [facility]. )   Employment: Unemployed   Type of Work:     Education:  High school graduate   Homebound arranged:    Financial Resources:  Medicaid   Other Resources:  WIC   Cultural/Religious Considerations Which May Impact Care:  Per MOB's face sheet, MOB is [employer].   Strengths:  Ability to meet basic needs , Home prepared for child , Understanding of illness, Pediatrician chosen   Psychotropic Medications:         Pediatrician:    [place] area  Pediatrician List:    [facility]  [place]    [place]    [place]    [place]    [place]      Pediatrician Fax Number:    Risk Factors/Current Problems:  Mental Health Concerns    Cognitive State:  Able to Concentrate , Alert , Linear Thinking , Insightful , Goal Oriented    Mood/Affect:  Happy , Relaxed , Interested , Bright     CSW Assessment: CSW met with MOB in room 106.  When CSW arrived, MOB was watching TV, infant was asleep in bassinet, and FOB was resting on the couch. MOB gave CSW verbal permission to complete the assessment while FOB was present. During the assessment FOB was supportive of MOB and infant. MOB was easy to engage and receptive to meeting with CSW.   CSW asked about MOB's MH hx and MOB acknowledged a hx of anxiety and depression.  MOB denied taking any medications and reported feeling good during her pregnancy. Per MOB, MOB has been without symptoms for the past 11 months. MOB shared the MOB is an established patient with [facility] and agreed to reach out to her therapist if a need arises.   CSW asked about MOB's schizophrenia dx and per MOB, MOB was misdiagnosed.  MOB shared that MOB was having severe panic attacks daily and they were mistaken for symptoms for schizophrenia. MOB communicated that MOB was reevaluated and she is no longer considered to be schizophrenic.   CSW provided education regarding the baby blues period vs. perinatal mood disorders, discussed treatment and gave resources for mental health follow up if concerns   arise.  CSW recommends self-evaluation during the postpartum time period using the New Mom Checklist from Postpartum Progress and encouraged MOB to contact a medical professional if symptoms are noted at any time. MOB presented with insight and awareness and did not demonstrate any acute MH symptoms. CSW assessed for safety and MOB denied SI, HI, and AVH.    CSW provided review of Sudden Infant Death Syndrome (SIDS) precautions.    CSW identifies no further need for intervention and no barriers to discharge at this time.  CSW Plan/Description:  No Further Intervention Required/No Barriers to Discharge, Sudden Infant Death Syndrome (SIDS) Education, Neonatal Abstinence Syndrome (NAS) Education, Other Patient/Family Education, Other Information/Referral  to Community Resources   Otto Felkins Boyd-Gilyard, MSW, LCSW Clinical Social Work [phone]   Sherin Murdoch D BOYD-GILYARD, LCSW 01/06/2018, 3:05 PM 

## 2018-01-06 NOTE — Lactation Note (Signed)
This note was copied from a baby's chart. Lactation Consultation Note  Patient Name: Beverly Torres Date: 01/06/2018   Parents report they are no longer breastfeeding.  Mom reports she tried to latch her again around 3 am and infant just got mad so she gave her a bottle.  Mom reports she was getting something when hand expressed and pumped manually but the past few times did not get anything.  Mom reports she may start pumping again when her milk comes in and feed her breastmilk from the bottle.  Waiting on serum bilirubin results. MD came in to assess infant while talking with mom.  Urged mom to use DEBP for pumping while her and call for breastfeeding assistance if she would like to try and breastfeed her again. Maternal Data    Feeding    LATCH Score                   Interventions    Lactation Tools Discussed/Used     Consult Status      Larenda Reedy Thompson Caul 01/06/2018, 10:28 AM

## 2018-01-06 NOTE — Lactation Note (Signed)
This note was copied from a baby's chart. Lactation Consultation Note Baby 98 hrs old. Appears jaundice. Waiting on serum results. Baby poor BF. Mom states her nipples are to big for the baby. Mom has short shaft nipples. Med/large nipples. Mom doesn't have a bra w/her unable to wear shells. Mom has hand pump, uses that.  LC offered to set up DEBP. Mom refused, stated she will just use hand pump. Mom would start rubbing her face and cry. Mom states that she just needs some sleep and will get some when she gets home. Asked mom if she knows how to hand express, mom stated yes. Asked mom to hand express for [facility], mom refused.  Baby hasn't had a stool in over 36 hrs. Abd. Slightly distended, passing gas. LC massaged rectal area w/vasoline. No reaction. Discussed w/mom how much baby should be eating w/w/out formula.  Mom has Axis. Baby receiving Dory Horn. Mom thinks baby doesn't like Fish farm manager. Mom states baby takes formula better from FOB per mom. LC has concerns about baby d/c home today. Baby needs to be feeding better from bottle if not BF. Mom refused help w/BFat this time. Mom states she is going to pump and bottle feed once milk comes in.   Patient Name: Beverly Torres WGNFA'O Date: 01/06/2018 Reason for consult: Follow-up assessment;Early term 37-38.6wks;1st time breastfeeding   Maternal Data    Feeding Feeding Type: Formula Nipple Type: Slow - flow  LATCH Score                   Interventions    Lactation Tools Discussed/Used Tools: Pump(hand) Breast pump type: Manual   Consult Status Consult Status: Follow-up Date: 01/07/18 Follow-up type: In-patient    Theodoro Kalata 01/06/2018, 6:14 AM

## 2018-01-06 NOTE — Discharge Summary (Addendum)
Postpartum Discharge Summary     Patient Name: Beverly Torres DOB: 1995-06-22 MRN: [id number]  Date of admission: 01/03/2018 Delivering Provider: Gaylan Gerold R   Date of discharge: 01/06/2018  Admitting diagnosis: 38wks ctx 2 and 77mins apart Intrauterine pregnancy: [redacted]w[redacted]d     Secondary diagnosis:  Principal Problem:   Indication for care in labor or delivery Active Problems:   Anxiety   Smoking (tobacco) complicating pregnancy, second trimester   Morbid obesity ([place])   Rubella non-immune status, antepartum   Obesity affecting pregnancy, antepartum   Gestational diabetes mellitus, antepartum  Additional problems: non-adherent to medical management for diabetes     Discharge diagnosis: Term Pregnancy Delivered                                                                                                Post partum procedures:none  Augmentation: AROM and Pitocin  Complications: None  Hospital course:  Onset of Labor With Vaginal Delivery     22 y.o. yo G1P1001 at [redacted]w[redacted]d was admitted in Latent Labor on 01/03/2018. Her blood sugars were elevated, and she received intrapartum insulin for management of her GDM. Patient had an uncomplicated labor course as follows:  Membrane Rupture Time/Date: 6:37 AM ,01/04/2018   Intrapartum Procedures: Episiotomy: None [1]                                         Lacerations:  Vaginal [6];2nd degree [3]  Patient had a delivery of a Viable infant. 01/04/2018  Information for the patient's newborn:  Beverly, Torres [[id number]]  Delivery Method: Vaginal, Spontaneous(Filed from Delivery Summary)   Pateint had an uncomplicated postpartum course.  She is ambulating, tolerating a regular diet, passing flatus, and urinating well. Patient is discharged home in stable condition on 01/06/18.   Magnesium Sulfate recieved: No BMZ received: No  Physical exam  Vitals:   01/05/18 0400 01/05/18 1445 01/05/18 2241 01/06/18 0445  BP: 110/64  (!) 101/51 105/62 118/70  Pulse: 65 96 79 87  Resp: 16 18 18 16   Temp: 98.2 F (36.8 C)  98.4 F (36.9 C) 97.7 F (36.5 C)  TempSrc: Oral  Oral Oral  SpO2:   100%   Weight:      Height:       General: alert, cooperative and no distress Lochia: appropriate Uterine Fundus: firm Incision: N/A DVT Evaluation: No evidence of DVT seen on physical exam. Labs: Lab Results  Component Value Date   WBC 15.8 (H) 01/03/2018   HGB 12.1 01/03/2018   HCT 36.3 01/03/2018   MCV 94.5 01/03/2018   PLT 237 01/03/2018   CMP Latest Ref Rng & Units 01/04/2018  Glucose 70 - 99 mg/dL 162(H)  BUN 6 - 20 mg/dL -  Creatinine 0.44 - 1.00 mg/dL -  Sodium 135 - 145 mmol/L -  Potassium 3.5 - 5.1 mmol/L -  Chloride 101 - 111 mmol/L -  CO2 22 - 32 mmol/L -  Calcium 8.9 - 10.3 mg/dL -  Total Protein 6.5 - 8.1 g/dL -  Total Bilirubin 0.3 - 1.2 mg/dL -  Alkaline Phos 38 - 126 U/L -  AST 15 - 41 U/L -  ALT 14 - 54 U/L -    Discharge instruction: per After Visit Summary and "Baby and Me Booklet".  After visit meds:  Allergies as of 01/06/2018      Reactions   Meat [alpha-gal]    Per allergist, Red meat per patient   Strawberry (diagnostic) Itching      Medication List    STOP taking these medications   glyBURIDE 2.5 MG tablet Commonly known as:  DIABETA     TAKE these medications   ibuprofen 600 MG tablet Commonly known as:  ADVIL,MOTRIN Take 1 tablet (600 mg total) by mouth every 6 (six) hours.   VITAFOL GUMMIES 3.33-0.333-34.8 MG Chew Chew 3 tablets by mouth at bedtime.       Diet: routine diet  Activity: Advance as tolerated. Pelvic rest for 6 weeks.   Outpatient follow-up:4 weeks  message sent to schedule   Please schedule this patient for Postpartum visit in: 4 weeks with the following provider: Any provider For C/S patients schedule nurse incision check in weeks 2 weeks: no High risk pregnancy complicated by: GDM Delivery mode:  SVD Anticipated Birth Control:  POPs PP  Procedures needed: Colpo , GTT Schedule Integrated BH visit: no  Newborn Data: Live born female  Birth Weight: 6 lb 13.7 oz (3110 g) APGAR: 8, 9  Newborn Delivery   Birth date/time:  01/04/2018 17:02:00 Delivery type:  Vaginal, Spontaneous     Baby Feeding: both Disposition:home with mother  01/06/2018 Beverly Osmond, DO  Attestation: I have seen this patient and agree with the resident's documentation. I have examined them separately, and we have discussed the plan of care.  Beverly Torres. Beverly China, DO OB/GYN Fellow

## 2018-01-09 ENCOUNTER — Telehealth: Payer: Self-pay

## 2018-01-09 ENCOUNTER — Encounter (HOSPITAL_COMMUNITY): Payer: Self-pay | Admitting: *Deleted

## 2018-01-09 ENCOUNTER — Other Ambulatory Visit: Payer: Self-pay

## 2018-01-09 ENCOUNTER — Inpatient Hospital Stay (HOSPITAL_COMMUNITY)
Admission: AD | Admit: 2018-01-09 | Discharge: 2018-01-09 | Disposition: A | Discharge status: Home / Self Care | Payer: Medicaid Other | Source: Ambulatory Visit | Attending: Obstetrics and Gynecology | Admitting: Obstetrics and Gynecology

## 2018-01-09 DIAGNOSIS — O99335 Smoking (tobacco) complicating the puerperium: Secondary | ICD-10-CM | POA: Insufficient documentation

## 2018-01-09 LAB — CBC
HEMATOCRIT: 30.6 % — AB (ref 36.0–46.0)
HEMOGLOBIN: 9.7 g/dL — AB (ref 12.0–15.0)
MCH: 30.3 pg (ref 26.0–34.0)
MCHC: 31.7 g/dL (ref 30.0–36.0)
MCV: 95.6 fL (ref 80.0–100.0)
NRBC: 0 % (ref 0.0–0.2)
Platelets: 305 10*3/uL (ref 150–400)
RBC: 3.2 MIL/uL — ABNORMAL LOW (ref 3.87–5.11)
RDW: 13.3 % (ref 11.5–15.5)
WBC: 13 10*3/uL — AB (ref 4.0–10.5)

## 2018-01-09 MED ORDER — AMOXICILLIN-POT CLAVULANATE 875-125 MG PO TABS: 1.0000 | ORAL_TABLET | Freq: Two times a day (BID) | ORAL | 0 refills | Status: AC

## 2018-01-09 MED ORDER — METHYLERGONOVINE MALEATE 0.2 MG PO TABS: 0.2000 mg | ORAL_TABLET | Freq: Three times a day (TID) | ORAL | 0 refills | Status: DC

## 2018-01-09 NOTE — MAU Note (Signed)
Vag del 11/27, passed 2 golf ball sized clots.   Got a HA after passed first clot, still there- but not as bad. Denies dizziness. Going to the bathroom ok.

## 2018-01-09 NOTE — MAU Provider Note (Signed)
[facility] OB/GYN Attending MAU Note  Chief Complaint: Vaginal Bleeding    First Provider Initiated Contact with Patient 01/09/18 [place] is a 22 y.o. G1P1001 at 5 days pp who presents with passing blood clots x 2. She brings in a sample. Had 307 cc blood loss with delivery. She states she has been bleeding minimally sine delivery. No abdominal pain. No N/V. No fever. Had small tear. She is breast feeding.   Past Medical History:  Diagnosis Date  . Acid reflux   . Anxiety   . Attention deficit disorder   . Chronic chest pain   . Depression   . Diabetes mellitus without complication ([place])   . Gestational diabetes   . Morbid obesity with BMI of 40.0-44.9, adult ([place])   . Vaginal Pap smear, abnormal    OB History  Gravida Para Term Preterm AB Living  1 1 1     1   SAB TAB Ectopic Multiple Live Births        0 1    # Outcome Date GA Lbr Len/2nd Weight Sex Delivery Anes PTL Lv  1 Term 01/04/18 [redacted]w[redacted]d 10:00 / 00:25 3110 g F Vag-Spont EPI  LIV   Past Surgical History:  Procedure Laterality Date  . ADENOIDECTOMY    . TONSILLECTOMY    . WISDOM TOOTH EXTRACTION     Social History   Socioeconomic History  . Marital status: Married    Spouse name: Not on file  . Number of children: Not on file  . Years of education: Not on file  . Highest education level: Not on file  Occupational History  . Not on file  Social Needs  . Financial resource strain: Not hard at all  . Food insecurity:    Worry: Never true    Inability: Never true  . Transportation needs:    Medical: No    Non-medical: Not on file  Tobacco Use  . Smoking status: Light Tobacco Smoker    Packs/day: 0.25    Years: 8.00    Pack years: 2.00    Types: Cigarettes  . Smokeless tobacco: Never Used  Substance and Sexual Activity  . Alcohol use: No  . Drug use: No  . Sexual activity: Yes    Birth control/protection: None  Lifestyle  . Physical activity:    Days per week:  Not on file    Minutes per session: Not on file  . Stress: Not at all  Relationships  . Social connections:    Talks on phone: Not on file    Gets together: Not on file    Attends religious service: Not on file    Active member of club or organization: Not on file    Attends meetings of clubs or organizations: Not on file    Relationship status: Not on file  . Intimate partner violence:    Fear of current or ex partner: No    Emotionally abused: No    Physically abused: No    Forced sexual activity: No  Other Topics Concern  . Not on file  Social History Narrative  . Not on file   No current facility-administered medications on file prior to encounter.    Current Outpatient Medications on File Prior to Encounter  Medication Sig Dispense Refill  . ibuprofen (ADVIL,MOTRIN) 600 MG tablet Take 1 tablet (600 mg total) by mouth every 6 (six) hours. 30 tablet 0  . Prenatal Vit-Fe Phos-FA-Omega (  VITAFOL GUMMIES) 3.33-0.333-34.8 MG CHEW Chew 3 tablets by mouth at bedtime. 90 tablet 12   Allergies  Allergen Reactions  . 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 Strawberry (Diagnostic) Itching    ROS: Pertinent items in HPI  OBJECTIVE BP 122/66 (BP Location: Right Arm)   Pulse 98   Temp 98.2 F (36.8 C) (Oral)   Resp 17   SpO2 99%   Breastfeeding? Yes Comment: going well, baby is good CONSTITUTIONAL: Well-developed, well-nourished female in no acute distress.  HENT:  Normocephalic, atraumatic, External right and left ear normal. Oropharynx is clear and moist EYES: Conjunctivae and EOM are normal. No scleral icterus.  NECK: Normal range of motion, supple, no masses.  Normal thyroid.  SKIN: Skin is warm and dry. No rash noted. Not diaphoretic. No erythema. No pallor. [place]: Alert and oriented to person, place, and time. Normal reflexes, muscle tone coordination. No cranial nerve deficit noted. PSYCHIATRIC: Normal mood and affect. Normal behavior. Normal  judgment and thought content. CARDIOVASCULAR: Normal heart rate noted RESPIRATORY: Effort and breath sounds normal, no problems with respiration noted. ABDOMEN: Soft, normal bowel sounds, no distention noted.  No tenderness, rebound or guarding. Uterus is 3 finger breadths below umbilicus, slightly tender to palpation PELVIC: Normal appearing external genitalia; normal appearing vaginal mucosa and cervix.  mucous with bloody discharge, cervix is open 1 cm, uterus is tender. MUSCULOSKELETAL: Normal range of motion. No tenderness.  No cyanosis, clubbing, or edema.   LAB RESULTS Results for orders placed or performed during the hospital encounter of 01/09/18 (from the past 48 hour(s))  CBC     Status: Abnormal   Collection Time: 01/09/18  6:23 PM  Result Value Ref Range   WBC 13.0 (H) 4.0 - 10.5 K/uL   RBC 3.20 (L) 3.87 - 5.11 MIL/uL   Hemoglobin 9.7 (L) 12.0 - 15.0 g/dL   HCT 30.6 (L) 36.0 - 46.0 %   MCV 95.6 80.0 - 100.0 fL   MCH 30.3 26.0 - 34.0 pg   MCHC 31.7 30.0 - 36.0 g/dL   RDW 13.3 11.5 - 15.5 %   Platelets 305 150 - 400 K/uL   nRBC 0.0 0.0 - 0.2 %    Comment: Performed at [facility], [address], [place], [place] [address]    ASSESSMENT 1. Retained portions of placenta     PLAN Discharge home Antibiotics and methergine series given. Return for fever, chills.  Follow-up Information    [facility] Follow up.   Specialty:  Obstetrics and Gynecology Contact information: [address], [place] [phone]         Allergies as of 01/09/2018      Reactions   Meat [alpha-gal]    Per allergist, Red meat per patient   Strawberry (diagnostic) Itching      Medication List    TAKE these medications   amoxicillin-clavulanate 875-125 MG tablet Commonly known as:  AUGMENTIN Take 1 tablet by mouth every 12 (twelve) hours for 5 days.   ibuprofen 600 MG tablet Commonly known as:   ADVIL,MOTRIN Take 1 tablet (600 mg total) by mouth every 6 (six) hours.   methylergonovine 0.2 MG tablet Commonly known as:  METHERGINE Take 1 tablet (0.2 mg total) by mouth 3 (three) times daily.   VITAFOL GUMMIES 3.33-0.333-34.8 MG Chew Chew 3 tablets by mouth at bedtime.        Donnamae Jude,  MD 01/09/2018 7:16 PM

## 2018-01-09 NOTE — Discharge Instructions (Signed)
Endometritis Endometritis is irritation, soreness, or inflammation that affects the lining of the uterus (endometrium). Infection is usually the cause of endometritis. It is important to get treatment to prevent complications. Common complications may include more severe infections and not being able to have children(infertility). What are the causes? This condition may be caused by:  Bacterial infections.  STIs (sexually transmitted infections).  A miscarriage or childbirth, especially after a long labor or cesarean delivery.  Certain gynecological procedures. These may include dilation and curettage (D&C), hysteroscopy, or birth control (contraceptive) insertion.  Tuberculosis (TB).  What are the signs or symptoms? Symptoms of this condition include:  Fever.  Lower abdomen (abdominal) pain.  Pelvis (pelvic) pain.  Abnormal vaginal discharge or bleeding.  Abdominal bloating (distention) or swelling.  General discomfort or generally feeling ill.  Discomfort with bowel movements.  Constipation.  How is this diagnosed? This condition may be diagnosed based on:  A physical exam, including a pelvic exam.  Tests, such as: ? Blood tests. ? Removal of a sample of endometrial tissue for testing (endometrial biopsy). ? Examining a sample of vaginal discharge under a microscope (wet prep). ? Removal of a sample of fluid from the cervix for testing (cervical culture). ? Surgical examination of the pelvis and abdomen.  How is this treated? This condition is treated with:  Antibiotic medicines.  For more severe cases, hospitalization may be needed to give fluids and antibiotics directly into a vein through an IV tube.  Follow these instructions at home:  Take over-the-counter and prescription medicines only as told by your health care provider.  Drink enough fluid to keep your urine clear or pale yellow.  Take your antibiotic medicine as told by your health care  provider. Do not stop taking the antibiotic even if you start to feel better.  Do not douche or have sex (including vaginal, oral, and anal sex) until your health care provider approves.  If your endometritis was caused by an STI, do not have sex (including vaginal, oral, and anal sex) until your partner has also been treated for the STI.  Return to your normal activities as told by your health care provider. Ask your health care provider what activities are safe for you.  Keep all follow-up visits as told by your health care provider. This is important. Contact a health care provider if:  You have pain that does not get better with medicine.  You have a fever.  You have pain with bowel movements. Get help right away if:  You have abdominal swelling.  You have abdominal pain that gets worse.  You have bad-smelling vaginal discharge, or an increased amount of vaginal discharge.  You have abnormal vaginal bleeding.  You have nausea and vomiting. Summary  Endometritis affects the lining of the uterus (endometrium) and is usually caused by an infection.  It is important to get treatment to prevent complications.  You have several treatment options for endometritis. Treatment may include antibiotics and IV fluids.  Take your antibiotic medicine as told by your health care provider. Do not stop taking the antibiotic even if you start to feel better.  Do not douche or have sex (including vaginal, oral, and anal sex) until your health care provider approves. This information is not intended to replace advice given to you by your health care provider. Make sure you discuss any questions you have with your health care provider. Document Released: 01/19/2001 Document Revised: 02/10/2016 Document Reviewed: 02/10/2016 Elsevier Interactive Patient Education  2017  Elsevier Inc. ° °

## 2018-01-09 NOTE — MAU Note (Signed)
AVS signature pad not working. Pt signed paper copy

## 2018-01-09 NOTE — Telephone Encounter (Signed)
Returned call and pt stated that she passed 1 golf ball size clot, denies excess bleeding and pain other than mild cramping. Spoke with provider and advised pt to continue to monitor, and if symptoms get worse, proceed to [facility], pt agreed.

## 2018-01-20 ENCOUNTER — Inpatient Hospital Stay (HOSPITAL_COMMUNITY)
Admission: AD | Admit: 2018-01-20 | Discharge: 2018-01-20 | Disposition: A | Discharge status: Home / Self Care | Payer: Medicaid Other | Attending: Obstetrics & Gynecology | Admitting: Obstetrics & Gynecology

## 2018-01-20 ENCOUNTER — Other Ambulatory Visit: Payer: Self-pay

## 2018-01-20 ENCOUNTER — Encounter (HOSPITAL_COMMUNITY): Payer: Self-pay | Admitting: *Deleted

## 2018-01-20 DIAGNOSIS — O99215 Obesity complicating the puerperium: Secondary | ICD-10-CM | POA: Diagnosis not present

## 2018-01-20 DIAGNOSIS — R1084 Generalized abdominal pain: Secondary | ICD-10-CM

## 2018-01-20 DIAGNOSIS — O99335 Smoking (tobacco) complicating the puerperium: Secondary | ICD-10-CM | POA: Diagnosis not present

## 2018-01-20 DIAGNOSIS — O9963 Diseases of the digestive system complicating the puerperium: Secondary | ICD-10-CM | POA: Insufficient documentation

## 2018-01-20 DIAGNOSIS — O9089 Other complications of the puerperium, not elsewhere classified: Secondary | ICD-10-CM | POA: Diagnosis not present

## 2018-01-20 DIAGNOSIS — E119 Type 2 diabetes mellitus without complications: Secondary | ICD-10-CM | POA: Insufficient documentation

## 2018-01-20 DIAGNOSIS — O99345 Other mental disorders complicating the puerperium: Secondary | ICD-10-CM | POA: Diagnosis not present

## 2018-01-20 DIAGNOSIS — N644 Mastodynia: Secondary | ICD-10-CM

## 2018-01-20 DIAGNOSIS — O2413 Pre-existing diabetes mellitus, type 2, in the puerperium: Secondary | ICD-10-CM | POA: Insufficient documentation

## 2018-01-20 DIAGNOSIS — F329 Major depressive disorder, single episode, unspecified: Secondary | ICD-10-CM | POA: Insufficient documentation

## 2018-01-20 DIAGNOSIS — O9279 Other disorders of lactation: Secondary | ICD-10-CM | POA: Diagnosis not present

## 2018-01-20 DIAGNOSIS — F419 Anxiety disorder, unspecified: Secondary | ICD-10-CM | POA: Insufficient documentation

## 2018-01-20 DIAGNOSIS — O9229 Other disorders of breast associated with pregnancy and the puerperium: Secondary | ICD-10-CM

## 2018-01-20 DIAGNOSIS — K219 Gastro-esophageal reflux disease without esophagitis: Secondary | ICD-10-CM | POA: Insufficient documentation

## 2018-01-20 LAB — COMPREHENSIVE METABOLIC PANEL
ALK PHOS: 59 U/L (ref 38–126)
ALT: 42 U/L (ref 0–44)
ANION GAP: 7 (ref 5–15)
AST: 25 U/L (ref 15–41)
Albumin: 3.7 g/dL (ref 3.5–5.0)
BUN: 16 mg/dL (ref 6–20)
CO2: 22 mmol/L (ref 22–32)
Calcium: 10 mg/dL (ref 8.9–10.3)
Chloride: 108 mmol/L (ref 98–111)
Creatinine, Ser: 0.83 mg/dL (ref 0.44–1.00)
Glucose, Bld: 87 mg/dL (ref 70–99)
Potassium: 4.1 mmol/L (ref 3.5–5.1)
SODIUM: 137 mmol/L (ref 135–145)
Total Bilirubin: 0.5 mg/dL (ref 0.3–1.2)
Total Protein: 6.9 g/dL (ref 6.5–8.1)

## 2018-01-20 LAB — CBC WITH DIFFERENTIAL/PLATELET
Basophils Absolute: 0.1 10*3/uL (ref 0.0–0.1)
Basophils Relative: 1 %
EOS PCT: 4 %
Eosinophils Absolute: 0.3 10*3/uL (ref 0.0–0.5)
HCT: 39.1 % (ref 36.0–46.0)
Hemoglobin: 12.9 g/dL (ref 12.0–15.0)
LYMPHS ABS: 3.3 10*3/uL (ref 0.7–4.0)
LYMPHS PCT: 42 %
MCH: 30.4 pg (ref 26.0–34.0)
MCHC: 33 g/dL (ref 30.0–36.0)
MCV: 92 fL (ref 80.0–100.0)
MONOS PCT: 3 %
Monocytes Absolute: 0.3 10*3/uL (ref 0.1–1.0)
Neutro Abs: 4 10*3/uL (ref 1.7–7.7)
Neutrophils Relative %: 50 %
PLATELETS: 304 10*3/uL (ref 150–400)
RBC: 4.25 MIL/uL (ref 3.87–5.11)
RDW: 12.9 % (ref 11.5–15.5)
WBC: 7.9 10*3/uL (ref 4.0–10.5)
nRBC: 0 % (ref 0.0–0.2)

## 2018-01-20 NOTE — MAU Note (Signed)
Vag del 11/27.  Been having sharp pains in her stomach.  Started having sharp pains in her breasts last night.  Denies fever.  Denies hot spots, d/c or bleeding from nipples.  Mainly pumps as baby can't latch.

## 2018-01-20 NOTE — MAU Provider Note (Signed)
History     CSN: [id number]  Arrival date and time: 01/20/18 1406   First Provider Initiated Contact with Patient 01/20/18 1623      Chief Complaint  Patient presents with  . Abdominal Pain  . Breast Pain   Beverly Torres is a 22 y.o. G1P1001 who is 16 days S/P NSVD. She is here today with lower abdominal pain and breast pain. Abdominal pain started 2 days ago. Breast pain started today. She was seen on 01/09/18, and was given methergine and augmentin. She states that she has taken the augmentin as prescribed. She did not take the methergine. She reports that her bleeding is minimal. She rates her breast and abdominal pain 5/10. She has tried ibuprofen. She reports it has not helped. She states that sometimes her abdominal pain is worse when she pumps. She states that her breasts do not hurt more during pumping.    OB History    Gravida  1   Para  1   Term  1   Preterm      AB      Living  1     SAB      TAB      Ectopic      Multiple  0   Live Births  1           Past Medical History:  Diagnosis Date  . Acid reflux   . Anxiety   . Attention deficit disorder   . Chronic chest pain   . Depression   . Diabetes mellitus without complication ([place])   . Gestational diabetes   . Morbid obesity with BMI of 40.0-44.9, adult ([place])   . Vaginal Pap smear, abnormal     Past Surgical History:  Procedure Laterality Date  . ADENOIDECTOMY    . TONSILLECTOMY    . WISDOM TOOTH EXTRACTION      Family History  Problem Relation Age of Onset  . Anxiety disorder Mother   . Drug abuse Father   . ADD / ADHD Father   . ADD / ADHD Sister   . Anxiety disorder Sister   . ADD / ADHD Brother   . ODD Brother   . Rheum arthritis Maternal Grandmother   . Kidney failure Maternal Grandmother   . Hypertension Maternal Grandfather   . Diabetes Maternal Grandfather   . Arthritis Maternal Grandfather   . Cancer Maternal Grandfather        Melanoma  . Diabetes Paternal  Grandfather     Social History   Tobacco Use  . Smoking status: Light Tobacco Smoker    Packs/day: 0.25    Years: 8.00    Pack years: 2.00    Types: Cigarettes  . Smokeless tobacco: Never Used  Substance Use Topics  . Alcohol use: No  . Drug use: No    Allergies:  Allergies  Allergen Reactions  . 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 Strawberry (Diagnostic) Itching    Medications Prior to Admission  Medication Sig Dispense Refill Last Dose  . ibuprofen (ADVIL,MOTRIN) 600 MG tablet Take 1 tablet (600 mg total) by mouth every 6 (six) hours. 30 tablet 0   . methylergonovine (METHERGINE) 0.2 MG tablet Take 1 tablet (0.2 mg total) by mouth 3 (three) times daily. 15 tablet 0   . Prenatal Vit-Fe Phos-FA-Omega (VITAFOL GUMMIES) 3.33-0.333-34.8 MG CHEW Chew 3 tablets by mouth at bedtime. 90 tablet 12 Past Week at Unknown time  Review of Systems  Constitutional: Negative for chills and fever.  Gastrointestinal: Positive for abdominal pain and nausea. Negative for vomiting.  Genitourinary: Positive for vaginal bleeding (minimal ). Negative for dysuria, frequency, pelvic pain and vaginal discharge.   Physical Exam   Blood pressure 111/64, pulse 91, temperature 97.6 F (36.4 C), temperature source Oral, resp. rate 17, SpO2 98 %, currently breastfeeding.  Physical Exam  Nursing note and vitals reviewed. Constitutional: She is oriented to person, place, and time. She appears well-developed and well-nourished. No distress.  HENT:  Head: Normocephalic.  Cardiovascular: Normal rate.  Respiratory: Effort normal. Right breast exhibits no mass, no skin change and no tenderness. Left breast exhibits no mass, no skin change and no tenderness.  GI: Soft. There is no abdominal tenderness. There is no rebound.  Neurological: She is alert and oriented to person, place, and time.  Skin: Skin is warm and dry.  Psychiatric: She has a normal mood and affect.   Results  for orders placed or performed during the hospital encounter of 01/20/18 (from the past 24 hour(s))  CBC with Differential/Platelet     Status: None   Collection Time: 01/20/18  4:57 PM  Result Value Ref Range   WBC 7.9 4.0 - 10.5 K/uL   RBC 4.25 3.87 - 5.11 MIL/uL   Hemoglobin 12.9 12.0 - 15.0 g/dL   HCT 39.1 36.0 - 46.0 %   MCV 92.0 80.0 - 100.0 fL   MCH 30.4 26.0 - 34.0 pg   MCHC 33.0 30.0 - 36.0 g/dL   RDW 12.9 11.5 - 15.5 %   Platelets 304 150 - 400 K/uL   nRBC 0.0 0.0 - 0.2 %   Neutrophils Relative % 50 %   Neutro Abs 4.0 1.7 - 7.7 K/uL   Lymphocytes Relative 42 %   Lymphs Abs 3.3 0.7 - 4.0 K/uL   Monocytes Relative 3 %   Monocytes Absolute 0.3 0.1 - 1.0 K/uL   Eosinophils Relative 4 %   Eosinophils Absolute 0.3 0.0 - 0.5 K/uL   Basophils Relative 1 %   Basophils Absolute 0.1 0.0 - 0.1 K/uL  Comprehensive metabolic panel     Status: None   Collection Time: 01/20/18  4:57 PM  Result Value Ref Range   Sodium 137 135 - 145 mmol/L   Potassium 4.1 3.5 - 5.1 mmol/L   Chloride 108 98 - 111 mmol/L   CO2 22 22 - 32 mmol/L   Glucose, Bld 87 70 - 99 mg/dL   BUN 16 6 - 20 mg/dL   Creatinine, Ser 0.83 0.44 - 1.00 mg/dL   Calcium 10.0 8.9 - 10.3 mg/dL   Total Protein 6.9 6.5 - 8.1 g/dL   Albumin 3.7 3.5 - 5.0 g/dL   AST 25 15 - 41 U/L   ALT 42 0 - 44 U/L   Alkaline Phosphatase 59 38 - 126 U/L   Total Bilirubin 0.5 0.3 - 1.2 mg/dL   GFR calc non Af Amer >60 >60 mL/min   GFR calc Af Amer >60 >60 mL/min   Anion gap 7 5 - 15     MAU Course  Procedures  MDM   Assessment and Plan   1. Generalized abdominal pain   2. Pain of breast during breastfeeding    DC home Comfort measures reviewed  RX: none  Return to MAU as needed FU with OB as planned  Follow-up Information    [facility] Follow up.   Contact information: [address] [place] [id number] [place] 01/20/2018, 4:23 PM

## 2018-01-20 NOTE — Discharge Instructions (Signed)
Breast Engorgement Breast engorgement is the overfilling of your breasts with breast milk. In the first few weeks after giving birth, you may experience breast engorgement. Although it is normal for your breasts to feel heavy, full, and uncomfortable within 3-5 days of giving birth, breast engorgement can make your breasts throb and feel hard, tightly stretched, warm, and tender. Engorgement peaks about the fifth day after you give birth. Breast engorgement can be easily treated and does not require you to stop breastfeeding. What are the causes? Some women delay feedings because of sore or cracked nipples, which can lead to engorgement. Cracked and sore nipples often are caused by inadequate latching (when your baby's mouth attaches to your breast to breastfeed). If your baby is latched on properly, he or she should be able to breastfeed as long as needed, without causing any pain. If you do feel pain while breastfeeding, take your baby off your breast and try again. Get help from your health care provider or a lactation consultant if you continue to have pain. Other causes of engorgement include:  Improper position of your baby while breastfeeding.  Allowing too much time to pass between feedings.  Reduction in breastfeeding because you give your baby water, juice, formula, breast milk from a bottle, or a pacifier instead of breastfeeding.  Changes in your babys feeding patterns.  Weak sucking from your baby, which causes less milk to be taken out of your breast during feedings.  Fatigue, stress, anemia.  Plugged milk ducts.  A history of breast surgery.  What are the signs or symptoms? If your breasts become engorged, you may experience:  Breast swelling, tenderness, warmth, redness, or throbbing.  Breast hardness and stretching of the skin around your breast.  Flattening, tightening, and hardening of your nipple.  A low-grade fever, which can be confused with a breast  infection.  How is this treated? Breast engorgement should improve in 24-48 hours after following these recommendations:  Breastfeed when you feel the need to reduce the fullness of your breasts or when your baby shows signs of hunger. This is called "breastfeeding on demand."  Newborns (babies younger than 4 weeks) often breastfeed every 1-3 hours during the day. You may need to awaken your baby to feed if he or she is asleep at a feeding time.  Do not allow your baby to sleep longer than 5 hours during the night without a feeding.  Pump or hand-express breast milk before breastfeeding to soften your breast, areola, and nipple.  Apply warm, moist heat (in the shower or with warm water-soaked hand towels) just before feeding or pumping, or massage your breast before or during breastfeeding. This increases circulation and helps your milk to flow.  Completely empty your breasts when breastfeeding or pumping. Afterward, wear a snug bra (nursing or regular) or tank top for 1-2 days to signal your body to slightly decrease milk production. Only wear snug bras or tank tops to treat engorgement. Tight bras typically should be avoided by breastfeeding mothers. Once engorgement is relieved, return to wearing regular, loose-fitting clothes.  Apply ice packs to your breasts to lessen the pain from engorgement and relieve swelling, unless the ice is uncomfortable for you.  Do not delay feedings. Try to relax when it is time to feed your baby. This helps to trigger your "let-down reflex," which releases milk from your breast.  Ensure your baby is latched on to your breast and positioned properly while breastfeeding.  Allow your baby to remain  at your breast as long as he or she is latched on well and actively sucking. Your baby will let you know when he or she is done breastfeeding by pulling away from your breast or falling asleep.  Avoid introducing bottles or pacifiers to your baby in the early weeks  of breastfeeding. Wait to introduce these things until after resolving any breastfeeding challenges.  Try to pump your milk on the same schedule as when your baby would breastfeed if you are returning to work or away from home for an extended period.  Drink plenty of fluids to avoid dehydration, which can eventually put you at greater risk of breast engorgement.  Contact a health care provider if:  Engorgement lasts longer than 2 days, even after treatment.  You have flu-like symptoms, such as a fever, chills, or body aches.  Your breasts become increasingly red and painful. This information is not intended to replace advice given to you by your health care provider. Make sure you discuss any questions you have with your health care provider. Document Released: 05/22/2004 Document Revised: 07/09/2015 Document Reviewed: 07/20/2012 Elsevier Interactive Patient Education  2017 [employer].

## 2018-01-30 ENCOUNTER — Other Ambulatory Visit (HOSPITAL_COMMUNITY)
Admission: RE | Admit: 2018-01-30 | Discharge: 2018-01-30 | Disposition: A | Discharge status: Home / Self Care | Payer: Medicaid Other | Source: Ambulatory Visit | Attending: Obstetrics and Gynecology | Admitting: Obstetrics and Gynecology

## 2018-01-30 ENCOUNTER — Ambulatory Visit (INDEPENDENT_AMBULATORY_CARE_PROVIDER_SITE_OTHER): Payer: Medicaid Other | Admitting: Obstetrics and Gynecology

## 2018-01-30 ENCOUNTER — Encounter: Payer: Self-pay | Admitting: Obstetrics and Gynecology

## 2018-01-30 VITALS — BP 113/71 | HR 87 | Wt 213.5 lb

## 2018-01-30 DIAGNOSIS — Z1389 Encounter for screening for other disorder: Secondary | ICD-10-CM

## 2018-01-30 DIAGNOSIS — R87612 Low grade squamous intraepithelial lesion on cytologic smear of cervix (LGSIL): Secondary | ICD-10-CM | POA: Diagnosis not present

## 2018-01-30 DIAGNOSIS — Z8632 Personal history of gestational diabetes: Secondary | ICD-10-CM | POA: Insufficient documentation

## 2018-01-30 MED ORDER — NORETHINDRONE 0.35 MG PO TABS: 1.0000 | ORAL_TABLET | Freq: Every day | ORAL | 11 refills | Status: DC

## 2018-01-30 NOTE — Patient Instructions (Signed)

## 2018-01-30 NOTE — Progress Notes (Signed)
Post Partum Exam  Beverly Torres is a 22 y.o. G56P1001 female who presents for a postpartum visit. She is 4 weeks postpartum following a spontaneous vaginal delivery. I have fully reviewed the prenatal and intrapartum course. The delivery was at [redacted]w[redacted]d gestational weeks.  Anesthesia: epidural. Postpartum course has been unremarkable. Baby's course has been unremarkable. Baby is feeding by both breast and bottle - [employer] Good Start. Bleeding no bleeding. Bowel function is normal. Bladder function is normal. Patient is not sexually active. Contraception method is oral progesterone-only contraceptive. Postpartum depression screening:neg (score:0)  The following portions of the patient's history were reviewed and updated as appropriate: allergies, past family history and past social history. Last pap smear done 07/13/17 and was Abnormal- LGSIL CIN-1/HPV  Review of Systems Pertinent items are noted in HPI.    Objective:  currently breastfeeding.  General:  alert   Breasts:  deferred  Lungs: clear to auscultation bilaterally  Heart:  regular rate and rhythm, S1, S2 normal, no murmur, click, rub or gallop  Abdomen: soft, non-tender; bowel sounds normal; no masses,  no organomegaly   Vulva:  normal  Vagina: normal vagina  Cervix:  no lesions  Corpus: normal size, contour, position, consistency, mobility, non-tender  Adnexa:  normal adnexa  Rectal Exam: Not performed.        Assessment:    Nl postpartum exam. Pap smear done at today's visit.   Plan:   1. Contraception: oral progesterone-only contraceptive. U/R/B and back up method reviewed 2. Glucola ordered d/t H/O GDM 3. Follow up in: 1 yr or as needed.

## 2018-02-07 LAB — CYTOLOGY - PAP
Diagnosis: UNDETERMINED — AB
HPV (WINDOPATH): DETECTED — AB

## 2018-02-15 ENCOUNTER — Other Ambulatory Visit: Payer: Medicaid Other

## 2018-02-22 ENCOUNTER — Other Ambulatory Visit: Payer: Medicaid Other

## 2018-02-27 ENCOUNTER — Other Ambulatory Visit: Payer: Medicaid Other

## 2018-02-28 LAB — GLUCOSE TOLERANCE, 2 HOURS
GLUCOSE FASTING GTT: 88 mg/dL (ref 65–99)
GLUCOSE, 2 HOUR: 115 mg/dL (ref 65–139)

## 2018-03-03 ENCOUNTER — Telehealth: Payer: Self-pay

## 2018-03-03 NOTE — Telephone Encounter (Signed)
Returned call, advised of results 

## 2018-04-12 ENCOUNTER — Telehealth: Payer: Self-pay

## 2018-04-12 NOTE — Telephone Encounter (Signed)
TC from pt stating she is no longer breast feeding and would like a different birth control Pt was taking micronor .   Please advise

## 2018-04-13 ENCOUNTER — Other Ambulatory Visit: Payer: Self-pay

## 2018-04-13 DIAGNOSIS — Z3041 Encounter for surveillance of contraceptive pills: Secondary | ICD-10-CM

## 2018-04-13 MED ORDER — NORETHINDRONE ACET-ETHINYL EST 1.5-30 MG-MCG PO TABS: 1.0000 | ORAL_TABLET | Freq: Every day | ORAL | 11 refills | Status: DC

## 2018-04-13 NOTE — Telephone Encounter (Signed)
Rx sent pt will be made aware on MyChart not able to reach pt by phone.

## 2018-04-13 NOTE — Progress Notes (Signed)
Pt requested different Birth Control Pt is no longer Breast feeding  Rx sent as directed by Dr.Ervin.

## 2018-04-13 NOTE — Telephone Encounter (Signed)
Loestrin 1.5/30 # 12 Take as directed

## 2018-04-13 NOTE — Telephone Encounter (Signed)
Loestrin 1.5/30 # 12 Take daily as directed

## 2019-01-19 IMAGING — CR DG CHEST 2V
2 series · 2 of 2 positions shown · non-contrast
Comparison: 07/23/2016 and prior exams

CLINICAL DATA: Chest pain

EXAM:
CHEST  2 VIEW

[w chest pa]
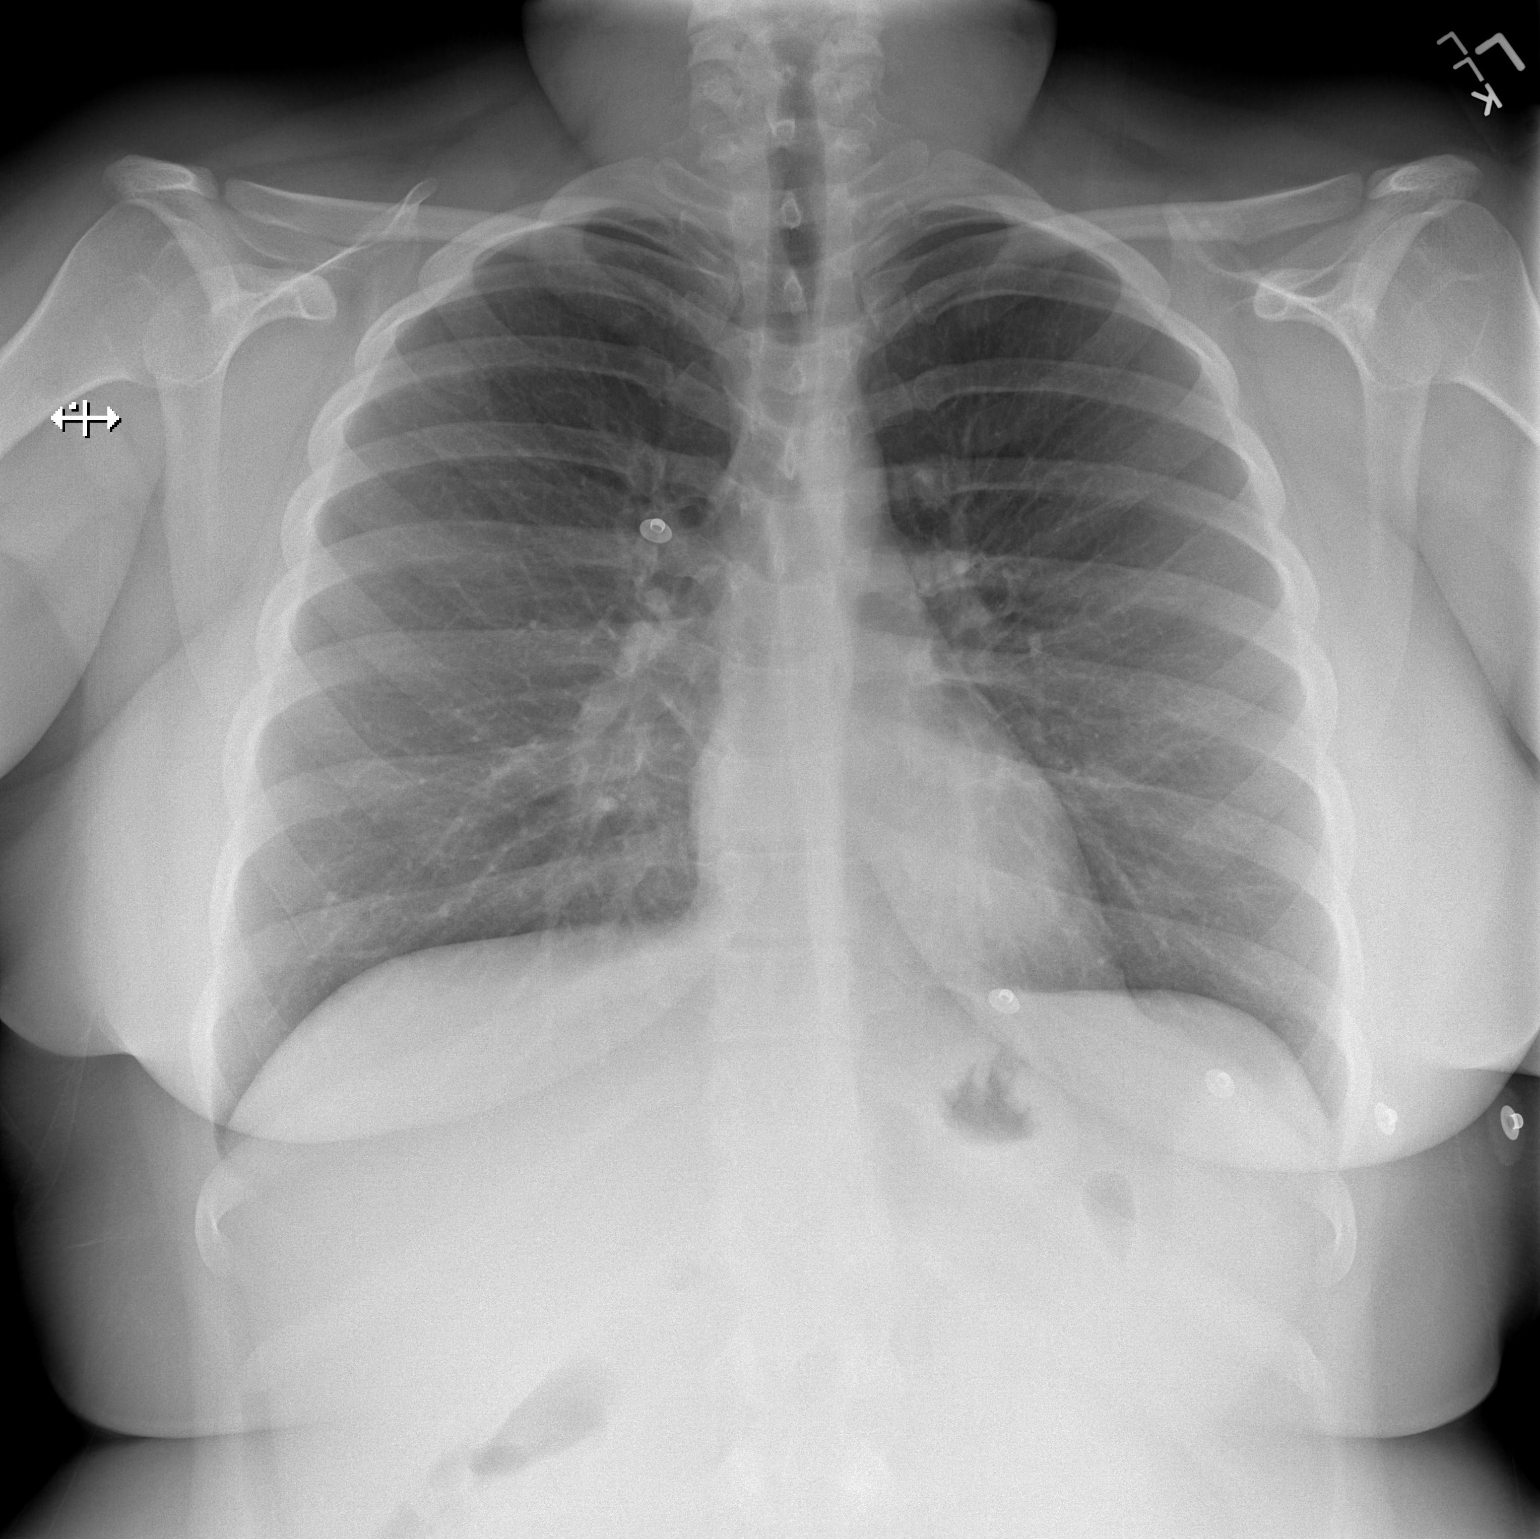

[w chest lat]
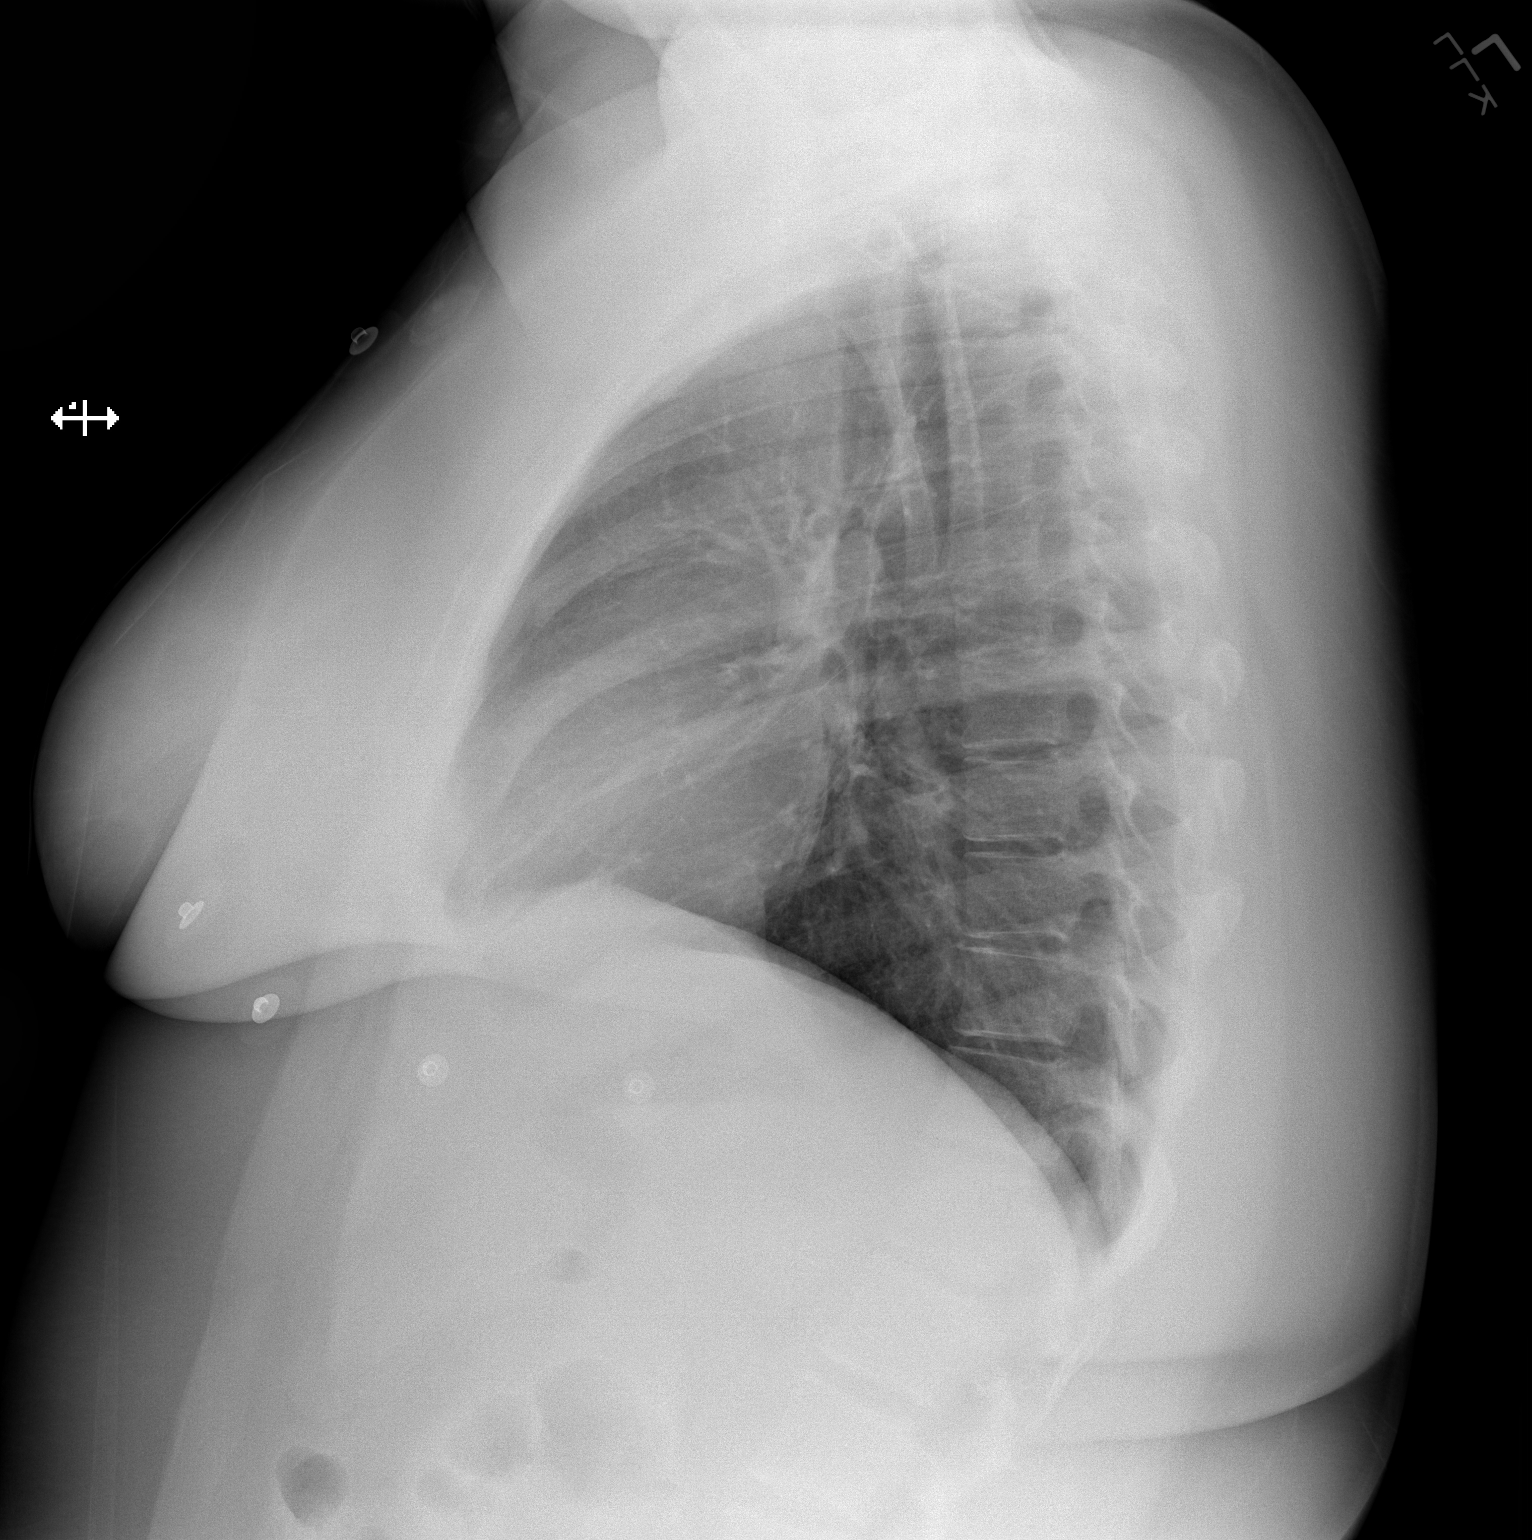

[2 of 2 positions shown; findings below may reference images not displayed]

FINDINGS: The cardiomediastinal silhouette is unremarkable.

There is no evidence of focal airspace disease, pulmonary edema,
suspicious pulmonary nodule/mass, pleural effusion, or pneumothorax.
No acute bony abnormalities are identified.
IMPRESSION: No active cardiopulmonary disease.

## 2019-02-09 IMAGING — CT CT ABD-PELV W/ CM
2 of 4 series · 17 of 46 positions shown, 19 images · IV contrast (iopamidol)
Comparison: None.

CLINICAL DATA: Right lower quadrant pain that started 48 hours ago.

EXAM:
CT ABDOMEN AND PELVIS WITH CONTRAST
TECHNIQUE: Multidetector CT imaging of the abdomen and pelvis was performed
using the standard protocol following bolus administration of
intravenous contrast.
CONTRAST:  100mL EXYLYH-2QQ IOPAMIDOL (EXYLYH-2QQ) INJECTION 61%

[Series 2: abd/pel with · axial · 0.74mm/px · z∈[+864,+1279]mm · 14 of 93 slices shown, 16 images]
[im 5/93  soft-tissue]
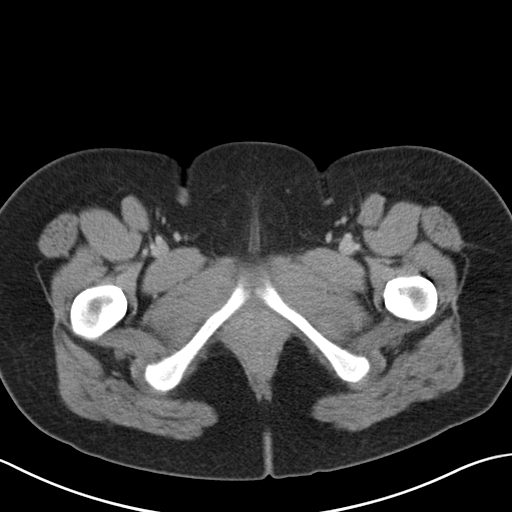
[im 5/93  bone]
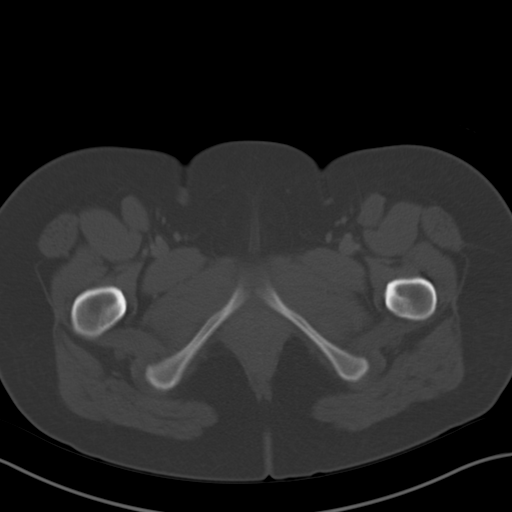
[im 14/93  soft-tissue]
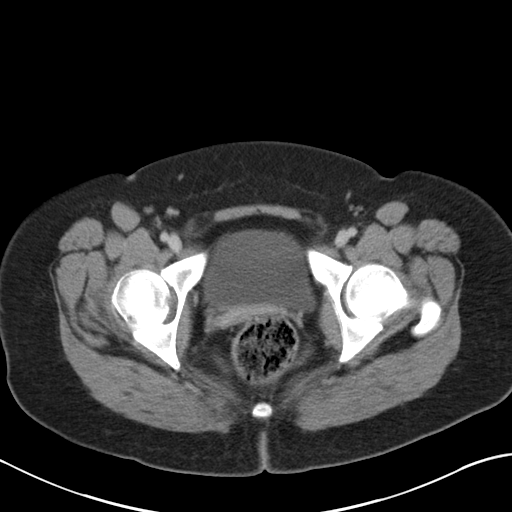
[im 18/93  soft-tissue]
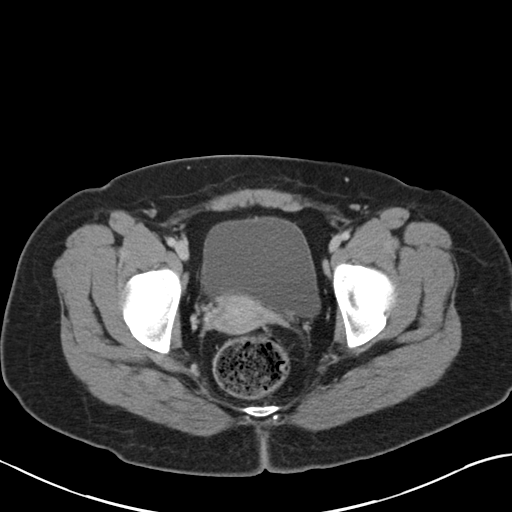
[im 27/93  soft-tissue]
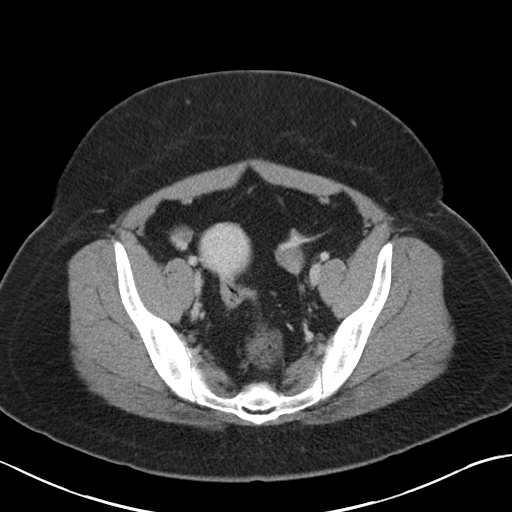
[im 31/93  soft-tissue]
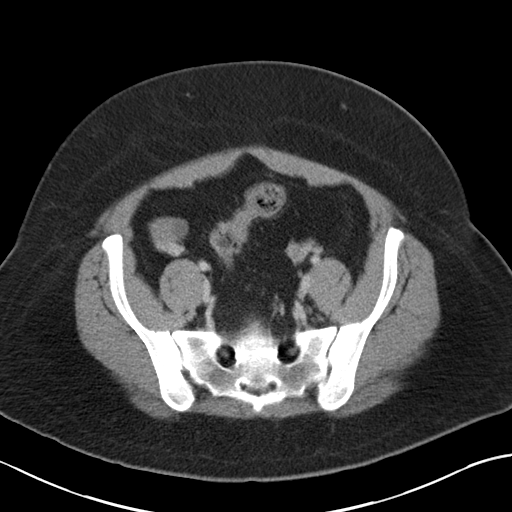
[im 36/93  soft-tissue]
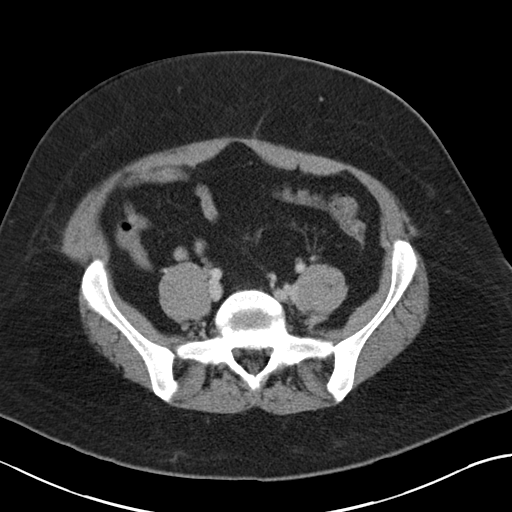
[im 44/93  soft-tissue]
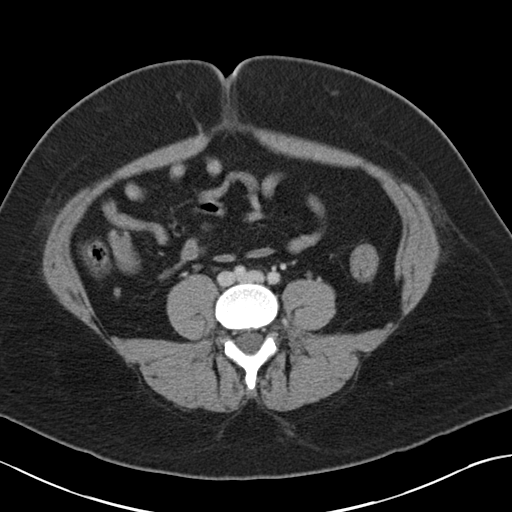
[im 49/93  soft-tissue]
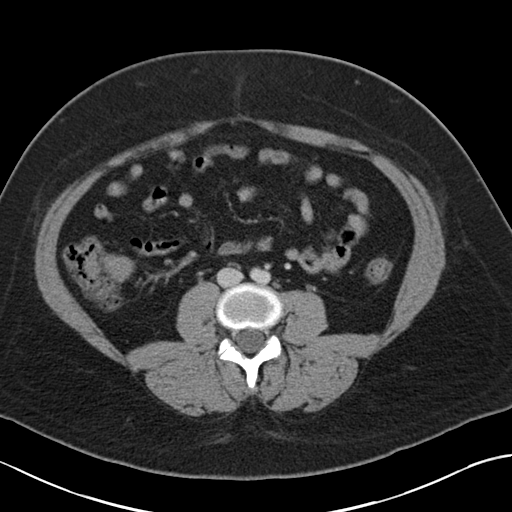
[im 57/93  soft-tissue]
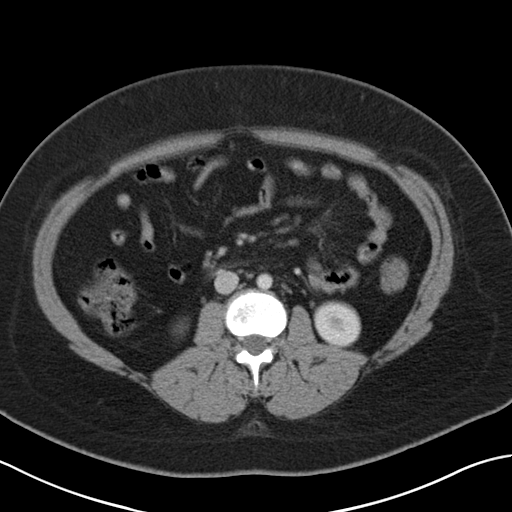
[im 57/93  bone]
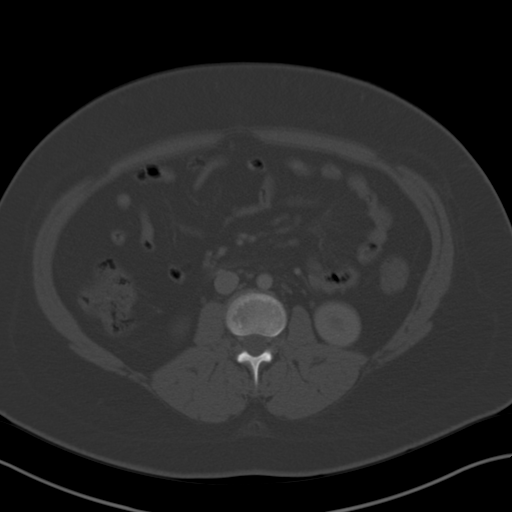
[im 62/93  soft-tissue]
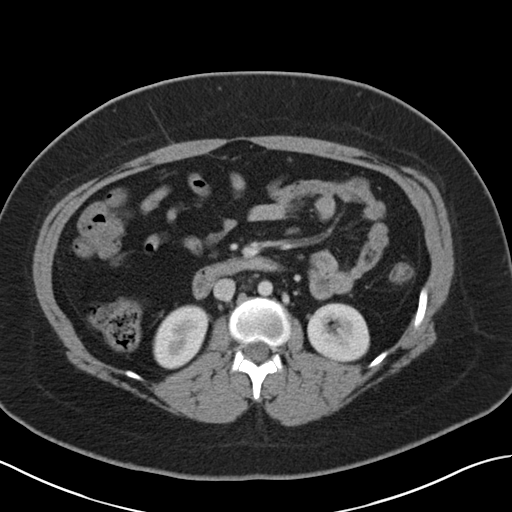
[im 71/93  soft-tissue]
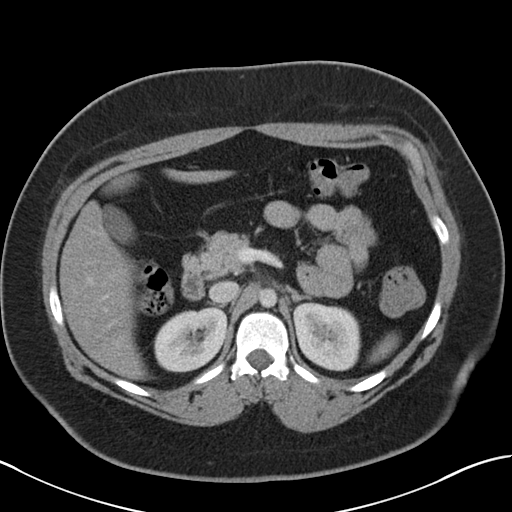
[im 75/93  soft-tissue]
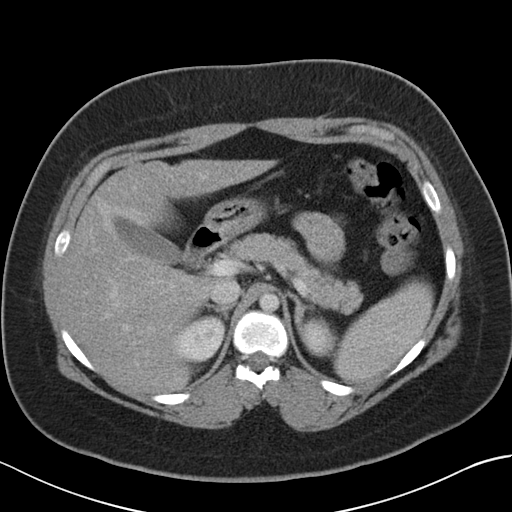
[im 79/93  soft-tissue]
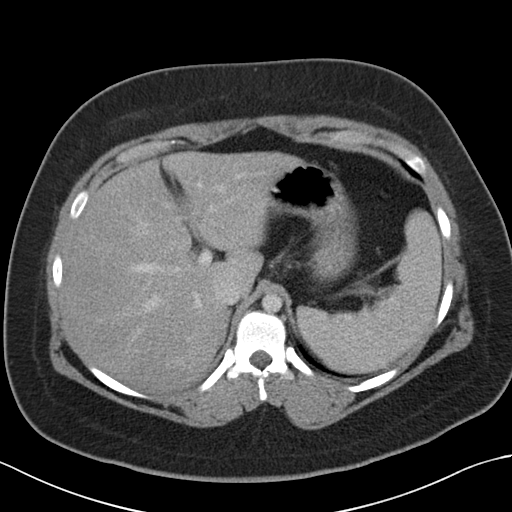
[im 88/93  soft-tissue]
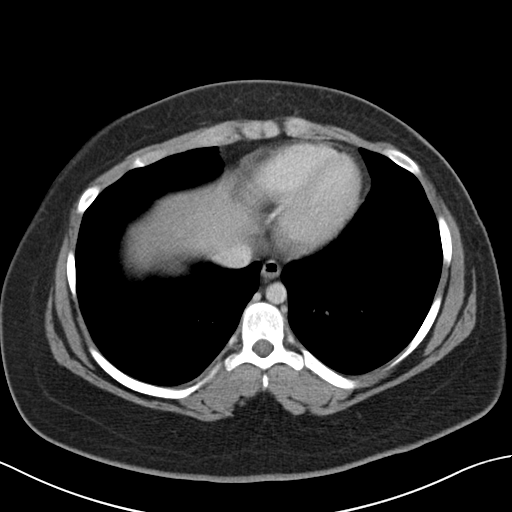

[Series 5: coronal a/|p · coronal · 0.74mm/px · 3 of 151 slices shown]
[im 51/151  soft-tissue]
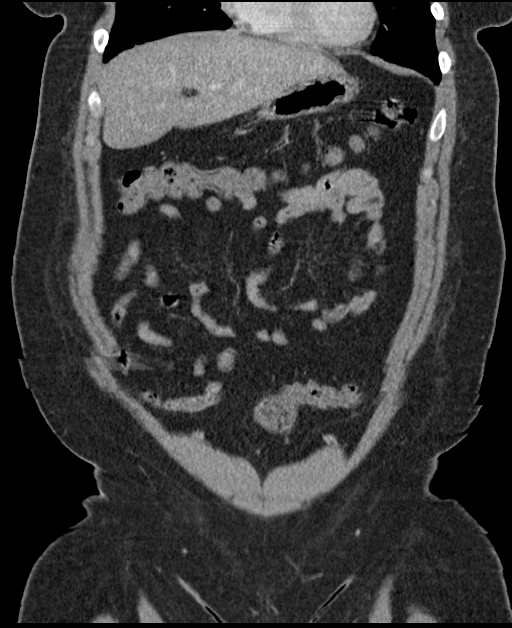
[im 67/151  soft-tissue]
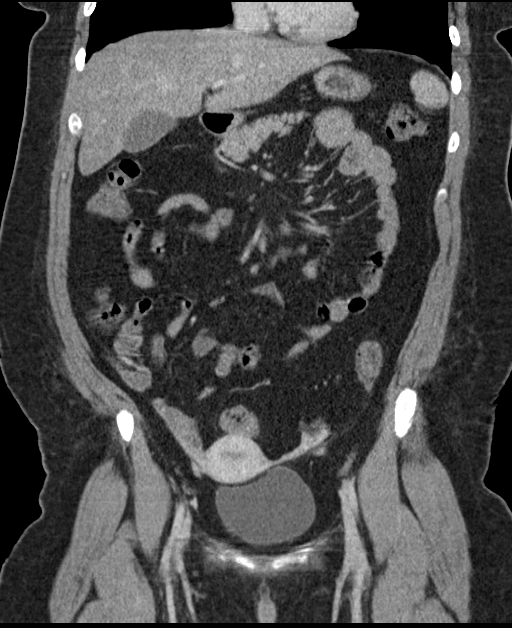
[im 84/151  soft-tissue]
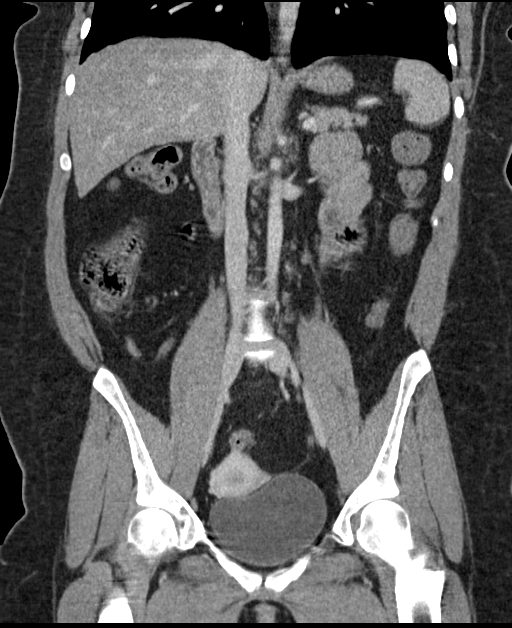

[17 of 46 positions shown; findings below may reference images not displayed]

FINDINGS: Lower chest:  Negative.

Hepatobiliary: No focal liver abnormality.Hepatic steatosis. No
evidence of biliary obstruction or stone.

Pancreas: Unremarkable.

Spleen: Unremarkable.

Adrenals/Urinary Tract: Negative adrenals. No hydronephrosis or
stone. Unremarkable bladder.

Stomach/Bowel:  No obstruction. No appendicitis.

Vascular/Lymphatic: No acute vascular abnormality. No mass or
adenopathy.

Reproductive:No pathologic findings.

Other: No ascites or pneumoperitoneum.

Musculoskeletal: No acute abnormalities. Osteitis condensans iliac.
Bilateral L4 and L5 chronic pars defects. Slight anterolisthesis at
L5-S1.
IMPRESSION: 1. No acute finding.  No appendicitis.
2. Hepatic steatosis.
3. Chronic L4 and L5 bilateral pars defects. Slight anterolisthesis
at L5-S1.

## 2019-02-24 ENCOUNTER — Encounter (HOSPITAL_COMMUNITY): Payer: Self-pay

## 2019-02-24 ENCOUNTER — Emergency Department (HOSPITAL_COMMUNITY)
Admission: EM | Admit: 2019-02-24 | Discharge: 2019-02-24 | Disposition: A | Discharge status: Home / Self Care | Payer: Medicaid Other | Attending: Emergency Medicine | Admitting: Emergency Medicine

## 2019-02-24 ENCOUNTER — Other Ambulatory Visit: Payer: Self-pay

## 2019-02-24 ENCOUNTER — Emergency Department (HOSPITAL_COMMUNITY): Payer: Medicaid Other

## 2019-02-24 DIAGNOSIS — R079 Chest pain, unspecified: Secondary | ICD-10-CM | POA: Diagnosis not present

## 2019-02-24 DIAGNOSIS — M5489 Other dorsalgia: Secondary | ICD-10-CM | POA: Insufficient documentation

## 2019-02-24 DIAGNOSIS — Z5321 Procedure and treatment not carried out due to patient leaving prior to being seen by health care provider: Secondary | ICD-10-CM | POA: Insufficient documentation

## 2019-02-24 NOTE — ED Triage Notes (Signed)
Pt reports onset of chest pain and pain in her mid back.  Pt admits the pain is worse with deep breath.

## 2019-03-02 ENCOUNTER — Other Ambulatory Visit: Payer: Self-pay

## 2019-03-02 ENCOUNTER — Emergency Department (HOSPITAL_COMMUNITY): Payer: Medicaid Other

## 2019-03-02 ENCOUNTER — Emergency Department (HOSPITAL_COMMUNITY)
Admission: EM | Admit: 2019-03-02 | Discharge: 2019-03-02 | Discharge status: Against Medical Advice | Payer: Medicaid Other | Attending: Emergency Medicine | Admitting: Emergency Medicine

## 2019-03-02 ENCOUNTER — Encounter (HOSPITAL_COMMUNITY): Payer: Self-pay | Admitting: Emergency Medicine

## 2019-03-02 DIAGNOSIS — R079 Chest pain, unspecified: Secondary | ICD-10-CM | POA: Insufficient documentation

## 2019-03-02 DIAGNOSIS — Z79899 Other long term (current) drug therapy: Secondary | ICD-10-CM | POA: Insufficient documentation

## 2019-03-02 DIAGNOSIS — E119 Type 2 diabetes mellitus without complications: Secondary | ICD-10-CM | POA: Diagnosis not present

## 2019-03-02 DIAGNOSIS — Z5320 Procedure and treatment not carried out because of patient's decision for unspecified reasons: Secondary | ICD-10-CM | POA: Insufficient documentation

## 2019-03-02 DIAGNOSIS — F17210 Nicotine dependence, cigarettes, uncomplicated: Secondary | ICD-10-CM | POA: Diagnosis not present

## 2019-03-02 DIAGNOSIS — R0789 Other chest pain: Secondary | ICD-10-CM

## 2019-03-02 LAB — COMPREHENSIVE METABOLIC PANEL
ALT: 22 U/L (ref 0–44)
AST: 16 U/L (ref 15–41)
Albumin: 4.1 g/dL (ref 3.5–5.0)
Alkaline Phosphatase: 53 U/L (ref 38–126)
Anion gap: 9 (ref 5–15)
BUN: 16 mg/dL (ref 6–20)
CO2: 27 mmol/L (ref 22–32)
Calcium: 9.2 mg/dL (ref 8.9–10.3)
Chloride: 104 mmol/L (ref 98–111)
Creatinine, Ser: 0.79 mg/dL (ref 0.44–1.00)
GFR calc Af Amer: >60 mL/min (ref >60)
GFR calc non Af Amer: >60 mL/min (ref >60)
Glucose, Bld: 96 mg/dL (ref 70–99)
Potassium: 4.1 mmol/L (ref 3.5–5.1)
Sodium: 140 mmol/L (ref 135–145)
Total Bilirubin: 0.3 mg/dL (ref 0.3–1.2)
Total Protein: 6.9 g/dL (ref 6.5–8.1)

## 2019-03-02 LAB — CBC WITH DIFFERENTIAL/PLATELET
Abs Immature Granulocytes: 0.03 10*3/uL (ref 0.00–0.07)
Basophils Absolute: 0.1 10*3/uL (ref 0.0–0.1)
Basophils Relative: 1 %
Eosinophils Absolute: 0.2 10*3/uL (ref 0.0–0.5)
Eosinophils Relative: 2 %
HCT: 42.2 % (ref 36.0–46.0)
Hemoglobin: 13.6 g/dL (ref 12.0–15.0)
Immature Granulocytes: 0 %
Lymphocytes Relative: 36 %
Lymphs Abs: 3.4 10*3/uL (ref 0.7–4.0)
MCH: 30.3 pg (ref 26.0–34.0)
MCHC: 32.2 g/dL (ref 30.0–36.0)
MCV: 94 fL (ref 80.0–100.0)
Monocytes Absolute: 0.6 10*3/uL (ref 0.1–1.0)
Monocytes Relative: 6 %
Neutro Abs: 5.2 10*3/uL (ref 1.7–7.7)
Neutrophils Relative %: 55 %
Platelets: 336 10*3/uL (ref 150–400)
RBC: 4.49 MIL/uL (ref 3.87–5.11)
RDW: 12.5 % (ref 11.5–15.5)
WBC: 9.4 10*3/uL (ref 4.0–10.5)
nRBC: 0 % (ref 0.0–0.2)

## 2019-03-02 LAB — D-DIMER, QUANTITATIVE: D-Dimer, Quant: 0.37 ug/mL-FEU (ref 0.00–0.50)

## 2019-03-02 LAB — TROPONIN I (HIGH SENSITIVITY): Troponin I (High Sensitivity): <2 ng/L (ref <18)

## 2019-03-02 MED ORDER — FAMOTIDINE 20 MG PO TABS
20.0000 mg | ORAL_TABLET | Freq: Once | ORAL | Status: AC
  Administered 2019-03-02: 20 mg via ORAL
  Filled 2019-03-02: qty 1

## 2019-03-02 NOTE — Discharge Instructions (Addendum)
Follow-up with your doctor next week and start taking Pepcid 20 mg twice a day

## 2019-03-02 NOTE — ED Triage Notes (Signed)
Pt reports that she saw her PCP today for chest pain who referred her to go to the ED for a PE work up. The pt states she didn't go because her daughter was fussy. Pt states that she went home and started to have chest pain again.

## 2019-03-02 NOTE — ED Notes (Addendum)
Pt says she has to leave- reports her baby is at home and she is sick, spouse is calling saying pt needs to come home. Dr Roderic Palau aware and is at bedside to talk with pt.

## 2019-03-02 NOTE — ED Provider Notes (Signed)
[facility] EMERGENCY DEPARTMENT Provider Note   CSN: [id number] Arrival date & time: 03/02/19  2009     History Chief Complaint  Patient presents with  . Chest Pain    Beverly Torres is a 24 y.o. female.  Patient complains of burning chest pain.  The history is provided by the patient. No language interpreter was used.  Chest Pain Pain location:  Substernal area Pain quality: aching   Pain radiates to:  Does not radiate Pain severity:  Mild Onset quality:  Gradual Associated symptoms: no abdominal pain, no back pain, no cough, no fatigue and no headache        Past Medical History:  Diagnosis Date  . Acid reflux   . Anxiety   . Attention deficit disorder   . Chronic chest pain   . Depression   . Diabetes mellitus without complication ([place])   . Gestational diabetes   . Morbid obesity with BMI of 40.0-44.9, adult ([place])   . Vaginal Pap smear, abnormal     Patient Active Problem List   Diagnosis Date Noted  . History of gestational diabetes 01/30/2018  . Postpartum care following vaginal delivery 01/30/2018  . Vitamin D deficiency 07/18/2017  . 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 Smoking (tobacco) complicating pregnancy, second trimester 07/13/2017  . Morbid obesity ([place]) 07/13/2017  . 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 Anxiety disorder 08/21/2016  . Anxiety 05/02/2012  . ADHD (attention deficit hyperactivity disorder), combined type 03/28/2012    Past Surgical History:  Procedure Laterality Date  . ADENOIDECTOMY    . TONSILLECTOMY    . WISDOM TOOTH EXTRACTION       OB History    Gravida  1   Para  1   Term  1   Preterm      AB      Living  1     SAB      TAB      Ectopic      Multiple  0   Live Births  1           Family History  Problem Relation Age of Onset  . Anxiety disorder Mother   . Drug abuse Father   . ADD / ADHD Father   .  ADD / ADHD Sister   . Anxiety disorder Sister   . ADD / ADHD Brother   . ODD Brother   . Rheum arthritis Maternal Grandmother   . Kidney failure Maternal Grandmother   . Hypertension Maternal Grandfather   . Diabetes Maternal Grandfather   . Arthritis Maternal Grandfather   . Cancer Maternal Grandfather        Melanoma  . Diabetes Paternal Grandfather     Social History   Tobacco Use  . Smoking status: Light Tobacco Smoker    Packs/day: 0.25    Years: 8.00    Pack years: 2.00    Types: Cigarettes  . Smokeless tobacco: Never Used  Substance Use Topics  . Alcohol use: No  . Drug use: No    Home Medications Prior to Admission medications   Medication Sig Start Date End Date Taking? Authorizing Provider  buPROPion (WELLBUTRIN XL) 150 MG 24 hr tablet Take 150 mg by mouth every morning. 03/02/19  Yes [provider]    Allergies    Meat [alpha-gal] and Strawberry (diagnostic)  Review of Systems   Review of Systems  Constitutional: Negative for appetite change and fatigue.  HENT: Negative for congestion, ear discharge and sinus pressure.   Eyes: Negative for discharge.  Respiratory: Negative for cough.   Cardiovascular: Positive for chest pain.  Gastrointestinal: Negative for abdominal pain and diarrhea.  Genitourinary: Negative for frequency and hematuria.  Musculoskeletal: Negative for back pain.  Skin: Negative for rash.  Neurological: Negative for seizures and headaches.  Psychiatric/Behavioral: Negative for hallucinations.    Physical Exam Updated Vital Signs BP 116/61 (BP Location: Right Arm)   Pulse 80   Temp 98.1 F (36.7 C)   Resp 20   Ht 5\' 2"  (1.575 m)   Wt 96.2 kg   LMP 01/26/2019 (Exact Date)   SpO2 100%   BMI 38.79 kg/m   Physical Exam Vitals and nursing note reviewed.  Constitutional:      Appearance: She is well-developed.  HENT:     Head: Normocephalic.     Nose: Nose normal.  Eyes:     General: No scleral icterus.     Conjunctiva/sclera: Conjunctivae normal.  Neck:     Thyroid: No thyromegaly.  Cardiovascular:     Rate and Rhythm: Normal rate and regular rhythm.     Heart sounds: No murmur. No friction rub. No gallop.   Pulmonary:     Breath sounds: No stridor. No wheezing or rales.  Chest:     Chest wall: No tenderness.  Abdominal:     General: There is no distension.     Tenderness: There is no abdominal tenderness. There is no rebound.  Musculoskeletal:        General: Normal range of motion.     Cervical back: Neck supple.  Lymphadenopathy:     Cervical: No cervical adenopathy.  Skin:    Findings: No erythema or rash.  Neurological:     Mental Status: She is oriented to person, place, and time.     Motor: No abnormal muscle tone.     Coordination: Coordination normal.  Psychiatric:        Behavior: Behavior normal.     ED Results / Procedures / Treatments   Labs (all labs ordered are listed, but only abnormal results are displayed) Labs Reviewed  CBC WITH DIFFERENTIAL/PLATELET  D-DIMER, QUANTITATIVE (NOT AT [facility])  COMPREHENSIVE METABOLIC PANEL  I-STAT BETA HCG BLOOD, ED ([facility], [facility], [facility] ONLY)  TROPONIN I (HIGH SENSITIVITY)    EKG None  Radiology DG Chest Portable 1 View  Result Date: 03/02/2019 CLINICAL DATA:  Pain EXAM: PORTABLE CHEST 1 VIEW COMPARISON:  February 24, 2019 FINDINGS: The heart size and mediastinal contours are within normal limits. Both lungs are clear. The visualized skeletal structures are unremarkable. IMPRESSION: No active disease. Electronically Signed   By: Constance Holster M.D.   On: 03/02/2019 22:13    Procedures Procedures (including critical care time)  Medications Ordered in ED Medications  famotidine (PEPCID) tablet 20 mg (has no administration in time range)    ED Course  I have reviewed the triage vital signs and the nursing notes.  Pertinent labs & imaging results that were available during my care of the patient were reviewed by me and  considered in my medical decision making (see chart for details).    MDM Rules/Calculators/A&P                      Patient has a normal EKG and normal chest x-ray.  D-dimer was negative.  CBC with unremarkable.  Rest of studies are not back patient decided to leave AMA.  I suspect this is peptic ulcer disease and I suggested she take Pepcid and follow-up with her doctor. Final Clinical Impression(s) / ED Diagnoses Final diagnoses:  None    Rx / DC Orders ED Discharge Orders    None       Milton Ferguson, MD 03/02/19 2219

## 2019-03-02 NOTE — ED Notes (Signed)
BP taken in Left arm and right arm are significantly different- Dr Roderic Palau made aware.

## 2019-03-02 NOTE — ED Notes (Addendum)
Pt BP in right arm 115/48 & left arm 107/74

## 2019-03-04 ENCOUNTER — Emergency Department (HOSPITAL_COMMUNITY)
Admission: EM | Admit: 2019-03-04 | Discharge: 2019-03-04 | Disposition: A | Discharge status: Home / Self Care | Payer: Medicaid Other | Attending: Emergency Medicine | Admitting: Emergency Medicine

## 2019-03-04 ENCOUNTER — Encounter (HOSPITAL_COMMUNITY): Payer: Self-pay

## 2019-03-04 ENCOUNTER — Other Ambulatory Visit: Payer: Self-pay

## 2019-03-04 DIAGNOSIS — F17210 Nicotine dependence, cigarettes, uncomplicated: Secondary | ICD-10-CM | POA: Insufficient documentation

## 2019-03-04 DIAGNOSIS — R0789 Other chest pain: Secondary | ICD-10-CM | POA: Diagnosis not present

## 2019-03-04 DIAGNOSIS — E119 Type 2 diabetes mellitus without complications: Secondary | ICD-10-CM | POA: Diagnosis not present

## 2019-03-04 DIAGNOSIS — R109 Unspecified abdominal pain: Secondary | ICD-10-CM | POA: Diagnosis not present

## 2019-03-04 DIAGNOSIS — R079 Chest pain, unspecified: Secondary | ICD-10-CM

## 2019-03-04 LAB — BASIC METABOLIC PANEL
Anion gap: 7 (ref 5–15)
BUN: 14 mg/dL (ref 6–20)
CO2: 26 mmol/L (ref 22–32)
Calcium: 9.2 mg/dL (ref 8.9–10.3)
Chloride: 107 mmol/L (ref 98–111)
Creatinine, Ser: 0.79 mg/dL (ref 0.44–1.00)
GFR calc Af Amer: >60 mL/min (ref >60)
GFR calc non Af Amer: >60 mL/min (ref >60)
Glucose, Bld: 99 mg/dL (ref 70–99)
Potassium: 4.1 mmol/L (ref 3.5–5.1)
Sodium: 140 mmol/L (ref 135–145)

## 2019-03-04 LAB — CBC
HCT: 42.3 % (ref 36.0–46.0)
Hemoglobin: 13.5 g/dL (ref 12.0–15.0)
MCH: 29.9 pg (ref 26.0–34.0)
MCHC: 31.9 g/dL (ref 30.0–36.0)
MCV: 93.6 fL (ref 80.0–100.0)
Platelets: 316 10*3/uL (ref 150–400)
RBC: 4.52 MIL/uL (ref 3.87–5.11)
RDW: 12.3 % (ref 11.5–15.5)
WBC: 8.9 10*3/uL (ref 4.0–10.5)
nRBC: 0 % (ref 0.0–0.2)

## 2019-03-04 LAB — I-STAT BETA HCG BLOOD, ED (MC, WL, AP ONLY): I-stat hCG, quantitative: <5 m[IU]/mL (ref <5)

## 2019-03-04 LAB — TROPONIN I (HIGH SENSITIVITY): Troponin I (High Sensitivity): <2 ng/L (ref <18)

## 2019-03-04 MED ORDER — ACETAMINOPHEN 500 MG PO TABS
1000.0000 mg | ORAL_TABLET | Freq: Once | ORAL | Status: DC
  Filled 2019-03-04: qty 2

## 2019-03-04 NOTE — ED Provider Notes (Signed)
[facility] EMERGENCY DEPARTMENT Provider Note   CSN: [id number] Arrival date & time: 03/04/19  2112     History Chief Complaint  Patient presents with  . Back Pain  . Chest Pain    SLOAN SNAWDER is a 24 y.o. female.  Patient with history of reflux, anxiety, diabetes, obesity presents with pins-and-needles and burning chest pain for majority of the past week.  Mild radiation left shoulder/arm.  No exertional component.  Patient denies blood clot risk factors.  Patient evaluation blood work done 2 days ago in the emergency room however left before getting all the results.  Currently minimal symptoms.  No fever or infectious symptoms.  No radiation to the back.        Past Medical History:  Diagnosis Date  . Acid reflux   . Anxiety   . Attention deficit disorder   . Chronic chest pain   . Depression   . Diabetes mellitus without complication ([place])   . Gestational diabetes   . Morbid obesity with BMI of 40.0-44.9, adult ([place])   . Vaginal Pap smear, abnormal     Patient Active Problem List   Diagnosis Date Noted  . History of gestational diabetes 01/30/2018  . Postpartum care following vaginal delivery 01/30/2018  . Vitamin D deficiency 07/18/2017  . 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 Smoking (tobacco) complicating pregnancy, second trimester 07/13/2017  . Morbid obesity ([place]) 07/13/2017  . 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 Anxiety disorder 08/21/2016  . Anxiety 05/02/2012  . ADHD (attention deficit hyperactivity disorder), combined type 03/28/2012    Past Surgical History:  Procedure Laterality Date  . ADENOIDECTOMY    . TONSILLECTOMY    . WISDOM TOOTH EXTRACTION       OB History    Gravida  1   Para  1   Term  1   Preterm      AB      Living  1     SAB      TAB      Ectopic      Multiple  0   Live Births  1           Family  History  Problem Relation Age of Onset  . Anxiety disorder Mother   . Drug abuse Father   . ADD / ADHD Father   . ADD / ADHD Sister   . Anxiety disorder Sister   . ADD / ADHD Brother   . ODD Brother   . Rheum arthritis Maternal Grandmother   . Kidney failure Maternal Grandmother   . Hypertension Maternal Grandfather   . Diabetes Maternal Grandfather   . Arthritis Maternal Grandfather   . Cancer Maternal Grandfather        Melanoma  . Diabetes Paternal Grandfather     Social History   Tobacco Use  . Smoking status: Light Tobacco Smoker    Packs/day: 0.25    Years: 8.00    Pack years: 2.00    Types: Cigarettes  . Smokeless tobacco: Never Used  Substance Use Topics  . Alcohol use: No  . Drug use: No    Home Medications Prior to Admission medications   Medication Sig Start Date End Date Taking? Authorizing Provider  buPROPion (WELLBUTRIN XL) 150 MG 24 hr tablet Take 150 mg by mouth every morning. 03/02/19   [provider]    Allergies    Meat [alpha-gal] and Strawberry (diagnostic)  Review of Systems   Review of Systems  Constitutional: Negative for chills and fever.  HENT: Negative for congestion.   Eyes: Negative for visual disturbance.  Respiratory: Negative for shortness of breath.   Cardiovascular: Positive for chest pain.  Gastrointestinal: Positive for abdominal pain. Negative for vomiting.  Genitourinary: Negative for dysuria and flank pain.  Musculoskeletal: Negative for back pain, neck pain and neck stiffness.  Skin: Negative for rash.  Neurological: Negative for light-headedness and headaches.    Physical Exam Updated Vital Signs BP (!) 105/58 (BP Location: Right Arm)   Pulse 75   Temp 98.8 F (37.1 C)   Resp 18   Ht 5\' 2"  (1.575 m)   Wt 97.1 kg   SpO2 98%   BMI 39.14 kg/m   Physical Exam Vitals and nursing note reviewed.  Constitutional:      Appearance: She is well-developed.  HENT:     Head: Normocephalic and atraumatic.    Eyes:     General:        Right eye: No discharge.        Left eye: No discharge.     Conjunctiva/sclera: Conjunctivae normal.  Neck:     Trachea: No tracheal deviation.  Cardiovascular:     Rate and Rhythm: Normal rate and regular rhythm.     Heart sounds: Normal heart sounds.  Pulmonary:     Effort: Pulmonary effort is normal.     Breath sounds: Normal breath sounds.  Abdominal:     General: There is no distension.     Palpations: Abdomen is soft.     Tenderness: There is no abdominal tenderness. There is no guarding.  Musculoskeletal:     Cervical back: Normal range of motion and neck supple.     Right lower leg: No edema.     Left lower leg: No edema.  Skin:    General: Skin is warm.     Findings: No rash.  Neurological:     Mental Status: She is alert and oriented to person, place, and time.     ED Results / Procedures / Treatments   Labs (all labs ordered are listed, but only abnormal results are displayed) Labs Reviewed  CBC  BASIC METABOLIC PANEL  I-STAT BETA HCG BLOOD, ED ([facility], [facility], [facility] ONLY)  TROPONIN I (HIGH SENSITIVITY)    EKG None  Radiology DG Chest Portable 1 View  Result Date: 03/02/2019 CLINICAL DATA:  Pain EXAM: PORTABLE CHEST 1 VIEW COMPARISON:  February 24, 2019 FINDINGS: The heart size and mediastinal contours are within normal limits. Both lungs are clear. The visualized skeletal structures are unremarkable. IMPRESSION: No active disease. Electronically Signed   By: Constance Holster M.D.   On: 03/02/2019 22:13    Procedures Procedures (including critical care time)  Medications Ordered in ED Medications  acetaminophen (TYLENOL) tablet 1,000 mg (has no administration in time range)    ED Course  I have reviewed the triage vital signs and the nursing notes.  Pertinent labs & imaging results that were available during my care of the patient were reviewed by me and considered in my medical decision making (see chart for details).    MDM  Rules/Calculators/A&P                      Patient presents with recurrent chest pain.  Atypical presentation, patient had recent work-up including negative D-dimer 2 days ago which I reviewed the results.  Reviewed recent chest x-ray 2  days ago no acute findings.  Patient primarily concerned about the results that she left without knowing them.  EKG reviewed no acute signs of ischemia, normal heart rate.  Patient has outpatient follow-up is very low risk and is stable for discharge.  Patient had screening blood work and troponin done on arrival which was normal.  Trial of PPI   Final Clinical Impression(s) / ED Diagnoses Final diagnoses:  Acute chest pain    Rx / DC Orders ED Discharge Orders    None       Elnora Morrison, MD 03/04/19 2203

## 2019-03-04 NOTE — Discharge Instructions (Addendum)
Follow-up with your doctor as discussed and needed.  Return for shortness of breath, chest pain when you exert yourself, fevers or new concerns. Try prilosec from pharmacy for gerd symptoms.

## 2019-03-04 NOTE — ED Notes (Signed)
Patient verbalizes understanding of discharge instructions. Opportunity for questioning and answers were provided. Armband removed by staff, pt discharged from ED ambulatory to home.  

## 2019-03-04 NOTE — ED Notes (Signed)
Per ED Provider, further work-up is not needed, pt is appropraite for discharge.

## 2019-06-02 ENCOUNTER — Other Ambulatory Visit: Payer: Self-pay

## 2019-06-02 ENCOUNTER — Encounter (HOSPITAL_COMMUNITY): Payer: Self-pay | Admitting: Obstetrics and Gynecology

## 2019-06-02 ENCOUNTER — Inpatient Hospital Stay (HOSPITAL_COMMUNITY)
Admission: EM | Admit: 2019-06-02 | Discharge: 2019-06-02 | Disposition: A | Discharge status: Home / Self Care | Payer: Medicaid Other | Attending: Obstetrics and Gynecology | Admitting: Obstetrics and Gynecology

## 2019-06-02 ENCOUNTER — Inpatient Hospital Stay (HOSPITAL_COMMUNITY): Payer: Medicaid Other

## 2019-06-02 DIAGNOSIS — R109 Unspecified abdominal pain: Secondary | ICD-10-CM | POA: Diagnosis not present

## 2019-06-02 DIAGNOSIS — O26891 Other specified pregnancy related conditions, first trimester: Secondary | ICD-10-CM

## 2019-06-02 DIAGNOSIS — O26851 Spotting complicating pregnancy, first trimester: Secondary | ICD-10-CM | POA: Diagnosis present

## 2019-06-02 DIAGNOSIS — Z3A01 Less than 8 weeks gestation of pregnancy: Secondary | ICD-10-CM | POA: Insufficient documentation

## 2019-06-02 DIAGNOSIS — O99211 Obesity complicating pregnancy, first trimester: Secondary | ICD-10-CM | POA: Diagnosis not present

## 2019-06-02 DIAGNOSIS — Z3491 Encounter for supervision of normal pregnancy, unspecified, first trimester: Secondary | ICD-10-CM

## 2019-06-02 DIAGNOSIS — O4691 Antepartum hemorrhage, unspecified, first trimester: Secondary | ICD-10-CM | POA: Diagnosis not present

## 2019-06-02 DIAGNOSIS — Z6790 Unspecified blood type, Rh positive: Secondary | ICD-10-CM

## 2019-06-02 DIAGNOSIS — O99331 Smoking (tobacco) complicating pregnancy, first trimester: Secondary | ICD-10-CM | POA: Diagnosis not present

## 2019-06-02 DIAGNOSIS — O4690 Antepartum hemorrhage, unspecified, unspecified trimester: Secondary | ICD-10-CM

## 2019-06-02 DIAGNOSIS — O209 Hemorrhage in early pregnancy, unspecified: Secondary | ICD-10-CM | POA: Diagnosis not present

## 2019-06-02 DIAGNOSIS — F17210 Nicotine dependence, cigarettes, uncomplicated: Secondary | ICD-10-CM | POA: Insufficient documentation

## 2019-06-02 LAB — URINALYSIS, ROUTINE W REFLEX MICROSCOPIC
Bacteria, UA: NONE SEEN
Bilirubin Urine: NEGATIVE
Glucose, UA: NEGATIVE mg/dL
Hgb urine dipstick: NEGATIVE
Ketones, ur: NEGATIVE mg/dL
Nitrite: NEGATIVE
Protein, ur: NEGATIVE mg/dL
Specific Gravity, Urine: 1.024 (ref 1.005–1.030)
pH: 6 (ref 5.0–8.0)

## 2019-06-02 LAB — CBC
HCT: 42.1 % (ref 36.0–46.0)
Hemoglobin: 13.7 g/dL (ref 12.0–15.0)
MCH: 29.4 pg (ref 26.0–34.0)
MCHC: 32.5 g/dL (ref 30.0–36.0)
MCV: 90.3 fL (ref 80.0–100.0)
Platelets: 335 10*3/uL (ref 150–400)
RBC: 4.66 MIL/uL (ref 3.87–5.11)
RDW: 13.5 % (ref 11.5–15.5)
WBC: 12.4 10*3/uL — ABNORMAL HIGH (ref 4.0–10.5)
nRBC: 0 % (ref 0.0–0.2)

## 2019-06-02 LAB — WET PREP, GENITAL
Sperm: NONE SEEN
Trich, Wet Prep: NONE SEEN
Yeast Wet Prep HPF POC: NONE SEEN

## 2019-06-02 LAB — HCG, QUANTITATIVE, PREGNANCY: hCG, Beta Chain, Quant, S: 66849 m[IU]/mL — ABNORMAL HIGH (ref <5)

## 2019-06-02 NOTE — MAU Note (Signed)
Beverly Torres is a 24 y.o. at [redacted]w[redacted]d here in MAU reporting: started cramping and spotting earlier today. Only saw the spotting in the toilet and on the toilet paper but has not seen any bleeding since she was at [facility] earlier today. Is concerned due to the fact that she just had a miscarriage in October. No recent IC  LMP: 04/17/19  Onset of complaint: today  Pain score: 5/10  Vitals:   06/02/19 1617 06/02/19 1658  BP: (!) 108/54 106/64  Pulse:  86  Resp:  16  Temp:  98.5 F (36.9 C)  SpO2:  100%     Lab orders placed from triage: UA

## 2019-06-02 NOTE — MAU Provider Note (Signed)
History     CSN: [id number]  Arrival date and time: 06/02/19 1454   First Provider Initiated Contact with Patient 06/02/19 1719      Chief Complaint  Patient presents with  . Abdominal Pain  . Vaginal Bleeding   24 y.o. G3P1011 @[redacted]w[redacted]d  by LMP presenting with cramping and spotting. Sx started yesterday. Cramping is intermittent. Rates pain 5/10. She took Tylenol earlier but it didn't help. Spotting is mostly when she wipes and has seen some in the toilet, has not needed a pad.   OB History    Gravida  3   Para  1   Term  1   Preterm      AB  1   Living  1     SAB  1   TAB      Ectopic      Multiple  0   Live Births  1           Past Medical History:  Diagnosis Date  . Acid reflux   . Anxiety   . Attention deficit disorder   . Chronic chest pain   . Depression   . Diabetes mellitus without complication ([place])   . Gestational diabetes   . Morbid obesity with BMI of 40.0-44.9, adult ([place])   . Vaginal Pap smear, abnormal     Past Surgical History:  Procedure Laterality Date  . ADENOIDECTOMY    . TONSILLECTOMY    . WISDOM TOOTH EXTRACTION      Family History  Problem Relation Age of Onset  . Anxiety disorder Mother   . Drug abuse Father   . ADD / ADHD Father   . ADD / ADHD Sister   . Anxiety disorder Sister   . ADD / ADHD Brother   . ODD Brother   . Rheum arthritis Maternal Grandmother   . Kidney failure Maternal Grandmother   . Hypertension Maternal Grandfather   . Diabetes Maternal Grandfather   . Arthritis Maternal Grandfather   . Cancer Maternal Grandfather        Melanoma  . Diabetes Paternal Grandfather     Social History   Tobacco Use  . Smoking status: Light Tobacco Smoker    Packs/day: 0.25    Years: 8.00    Pack years: 2.00    Types: Cigarettes  . Smokeless tobacco: Never Used  Substance Use Topics  . Alcohol use: No  . Drug use: No    Allergies:  Allergies  Allergen Reactions  . Meat [Alpha-Gal]     Per  allergist, Red meat per patient  . Strawberry (Diagnostic) Itching    Medications Prior to Admission  Medication Sig Dispense Refill Last Dose  . buPROPion (WELLBUTRIN XL) 150 MG 24 hr tablet Take 150 mg by mouth every morning.       Review of Systems  Gastrointestinal: Positive for abdominal pain.  Genitourinary: Positive for vaginal bleeding. Negative for dysuria.   Physical Exam   Blood pressure 106/64, pulse 86, temperature 98.5 F (36.9 C), temperature source Oral, resp. rate 16, height 5\' 2"  (1.575 m), weight 100.1 kg, last menstrual period 04/17/2019, SpO2 100 %, currently breastfeeding.  Physical Exam  Nursing note and vitals reviewed. Constitutional: She is oriented to person, place, and time. She appears well-developed and well-nourished. No distress.  HENT:  Head: Normocephalic and atraumatic.  Cardiovascular: Normal rate.  Respiratory: Effort normal. No respiratory distress.  GI: Soft. She exhibits no distension and no mass. There is no abdominal tenderness.  There is no rebound and no guarding.  Genitourinary:    Genitourinary Comments: External: no lesions or erythema Vagina: rugated, pink, moist, scant thin white discharge, no blood Uterus: non enlarged, anteverted, non tender, no CMT Adnexae: no masses, no tenderness left, no tenderness right Cervix closed    Musculoskeletal:        General: Normal range of motion.     Cervical back: Normal range of motion.  Neurological: She is alert and oriented to person, place, and time.  Skin: Skin is warm and dry.  Psychiatric: She has a normal mood and affect.   Results for orders placed or performed during the hospital encounter of 06/02/19 (from the past 24 hour(s))  Urinalysis, Routine w reflex microscopic     Status: Abnormal   Collection Time: 06/02/19  5:08 PM  Result Value Ref Range   Color, Urine YELLOW YELLOW   APPearance HAZY (A) CLEAR   Specific Gravity, Urine 1.024 1.005 - 1.030   pH 6.0 5.0 - 8.0    Glucose, UA NEGATIVE NEGATIVE mg/dL   Hgb urine dipstick NEGATIVE NEGATIVE   Bilirubin Urine NEGATIVE NEGATIVE   Ketones, ur NEGATIVE NEGATIVE mg/dL   Protein, ur NEGATIVE NEGATIVE mg/dL   Nitrite NEGATIVE NEGATIVE   Leukocytes,Ua LARGE (A) NEGATIVE   RBC / HPF 0-5 0 - 5 RBC/hpf   WBC, UA 11-20 0 - 5 WBC/hpf   Bacteria, UA NONE SEEN NONE SEEN   Squamous Epithelial / LPF 11-20 0 - 5   Mucus PRESENT   CBC     Status: Abnormal   Collection Time: 06/02/19  5:21 PM  Result Value Ref Range   WBC 12.4 (H) 4.0 - 10.5 K/uL   RBC 4.66 3.87 - 5.11 MIL/uL   Hemoglobin 13.7 12.0 - 15.0 g/dL   HCT 42.1 36.0 - 46.0 %   MCV 90.3 80.0 - 100.0 fL   MCH 29.4 26.0 - 34.0 pg   MCHC 32.5 30.0 - 36.0 g/dL   RDW 13.5 11.5 - 15.5 %   Platelets 335 150 - 400 K/uL   nRBC 0.0 0.0 - 0.2 %  hCG, quantitative, pregnancy     Status: Abnormal   Collection Time: 06/02/19  5:21 PM  Result Value Ref Range   hCG, Beta Chain, Quant, S 66,849 (H) <5 mIU/mL  Wet prep, genital     Status: Abnormal   Collection Time: 06/02/19  5:30 PM   Specimen: PATH Cytology Cervicovaginal Ancillary Only  Result Value Ref Range   Yeast Wet Prep HPF POC NONE SEEN NONE SEEN   Trich, Wet Prep NONE SEEN NONE SEEN   Clue Cells Wet Prep HPF POC PRESENT (A) NONE SEEN   WBC, Wet Prep HPF POC MANY (A) NONE SEEN   Sperm NONE SEEN     US OB LESS THAN 14 WEEKS WITH OB TRANSVAGINAL  Result Date: 06/02/2019 CLINICAL DATA:  24 year old pregnant female with abdominal pain and bleeding. LMP: 04/17/2019 corresponding to an estimated gestational age of [redacted] weeks, 4 days. EXAM: OBSTETRIC <14 WK [place] AND TRANSVAGINAL OB US TECHNIQUE: Both transabdominal and transvaginal ultrasound examinations were performed for complete evaluation of the gestation as well as the maternal uterus, adnexal regions, and pelvic cul-de-sac. Transvaginal technique was performed to assess early pregnancy. COMPARISON:  None. FINDINGS: Intrauterine gestational sac: Single  intrauterine gestational sac. Yolk sac:  Seen Embryo:  Present Cardiac Activity: Detected Heart Rate: 139 bpm CRL: 8 mm   6 w   5 d  [place] EDC: 01/21/2020 Subchorionic hemorrhage:  None visualized. Maternal uterus/adnexae: IMPRESSION: Single live intrauterine pregnancy with an estimated gestational age of [redacted] weeks, 5 days based on ultrasound concordant with age based on LMP. Electronically Signed   By: Anner Crete M.D.   On: 06/02/2019 18:23   MAU Course  Procedures  MDM Labs and [place] ordered and reviewed. Viable IUP on [place] consistent with LMP dates, no G[place]orf. UA with leuks and WBCs but contaminated, will send UC. Discussed results with pt. Stable for discharge home.   Assessment and Plan   1. Normal intrauterine pregnancy on prenatal ultrasound in first trimester   2. Vaginal bleeding in pregnancy   3. Blood type, Rh positive    Discharge home Follow up at [facility] to start care SAB precautions  Allergies as of 06/02/2019      Reactions   Meat [alpha-gal]    Per allergist, Red meat per patient   Strawberry (diagnostic) Itching      Medication List    STOP taking these medications   buPROPion 150 MG 24 hr tablet Commonly known as: [place], CNM 06/02/2019, 6:59 PM

## 2019-06-02 NOTE — Discharge Instructions (Signed)

## 2019-06-04 LAB — GC/CHLAMYDIA PROBE AMP (~~LOC~~) NOT AT ARMC
Chlamydia: NEGATIVE
Comment: NEGATIVE
Comment: NORMAL
Neisseria Gonorrhea: NEGATIVE

## 2019-06-04 LAB — CULTURE, OB URINE: Culture: >=100000 — AB

## 2019-06-13 LAB — OB RESULTS CONSOLE GC/CHLAMYDIA
Chlamydia: NEGATIVE
Gonorrhea: NEGATIVE

## 2019-06-13 LAB — OB RESULTS CONSOLE RUBELLA ANTIBODY, IGM: Rubella: IMMUNE

## 2019-06-13 LAB — OB RESULTS CONSOLE ABO/RH: RH Type: POSITIVE

## 2019-06-13 LAB — OB RESULTS CONSOLE ANTIBODY SCREEN: Antibody Screen: NEGATIVE

## 2019-06-13 LAB — OB RESULTS CONSOLE HIV ANTIBODY (ROUTINE TESTING): HIV: NONREACTIVE

## 2019-06-13 LAB — OB RESULTS CONSOLE RPR: RPR: NONREACTIVE

## 2019-06-13 LAB — OB RESULTS CONSOLE HEPATITIS B SURFACE ANTIGEN: Hepatitis B Surface Ag: NEGATIVE

## 2019-11-14 ENCOUNTER — Other Ambulatory Visit: Payer: Self-pay | Admitting: Obstetrics and Gynecology

## 2019-11-14 DIAGNOSIS — Z8616 Personal history of COVID-19: Secondary | ICD-10-CM

## 2019-11-26 ENCOUNTER — Other Ambulatory Visit: Payer: Self-pay

## 2019-11-26 ENCOUNTER — Ambulatory Visit: Payer: Medicaid Other | Admitting: *Deleted

## 2019-11-26 ENCOUNTER — Other Ambulatory Visit: Payer: Self-pay | Admitting: Obstetrics and Gynecology

## 2019-11-26 ENCOUNTER — Ambulatory Visit: Payer: Medicaid Other | Attending: Obstetrics and Gynecology

## 2019-11-26 VITALS — BP 115/56 | HR 86

## 2019-11-26 DIAGNOSIS — F17200 Nicotine dependence, unspecified, uncomplicated: Secondary | ICD-10-CM

## 2019-11-26 DIAGNOSIS — O99333 Smoking (tobacco) complicating pregnancy, third trimester: Secondary | ICD-10-CM | POA: Diagnosis not present

## 2019-11-26 DIAGNOSIS — Z8616 Personal history of COVID-19: Secondary | ICD-10-CM | POA: Insufficient documentation

## 2019-11-26 DIAGNOSIS — O09293 Supervision of pregnancy with other poor reproductive or obstetric history, third trimester: Secondary | ICD-10-CM

## 2019-11-26 DIAGNOSIS — Z3A31 31 weeks gestation of pregnancy: Secondary | ICD-10-CM | POA: Insufficient documentation

## 2019-11-26 DIAGNOSIS — O24419 Gestational diabetes mellitus in pregnancy, unspecified control: Secondary | ICD-10-CM | POA: Diagnosis not present

## 2019-11-26 DIAGNOSIS — Z363 Encounter for antenatal screening for malformations: Secondary | ICD-10-CM

## 2019-11-27 ENCOUNTER — Ambulatory Visit: Payer: Medicaid Other

## 2019-11-28 ENCOUNTER — Other Ambulatory Visit: Payer: Self-pay

## 2019-11-28 ENCOUNTER — Encounter: Payer: Medicaid Other | Attending: Obstetrics and Gynecology | Admitting: Registered"

## 2019-11-28 ENCOUNTER — Encounter: Payer: Self-pay | Admitting: Registered"

## 2019-11-28 DIAGNOSIS — O24419 Gestational diabetes mellitus in pregnancy, unspecified control: Secondary | ICD-10-CM | POA: Diagnosis not present

## 2019-11-28 NOTE — Progress Notes (Signed)
Patient was seen on 11/28/19 for Gestational Diabetes self-management class at [facility]. The following learning objectives were met by the patient during this course:   States the definition of Gestational Diabetes  States why dietary management is important in controlling blood glucose  Describes the effects each nutrient has on blood glucose levels  Demonstrates ability to create a balanced meal plan  Demonstrates carbohydrate counting   States when to check blood glucose levels  Demonstrates proper blood glucose monitoring techniques  States the effect of stress and exercise on blood glucose levels  States the importance of limiting caffeine and abstaining from alcohol and smoking  Blood glucose monitor given: Accu-chek Guide Me Lot #[id number] Exp: 11/27/2020 CBG: 153 mg/dL  Patient instructed to monitor glucose levels: FBS: 60 - <95; 1 hour: <140; 2 hour: <120  Patient received handouts:  Nutrition Diabetes and Pregnancy, including carb counting list  Patient will be seen for follow-up as needed.

## 2019-12-03 ENCOUNTER — Other Ambulatory Visit: Payer: Self-pay | Admitting: Obstetrics

## 2019-12-03 DIAGNOSIS — O99333 Smoking (tobacco) complicating pregnancy, third trimester: Secondary | ICD-10-CM

## 2019-12-06 ENCOUNTER — Ambulatory Visit: Payer: Medicaid Other | Attending: Obstetrics and Gynecology

## 2019-12-06 ENCOUNTER — Ambulatory Visit: Payer: Medicaid Other | Admitting: *Deleted

## 2019-12-06 ENCOUNTER — Other Ambulatory Visit: Payer: Self-pay

## 2019-12-06 VITALS — BP 115/60 | HR 96

## 2019-12-06 DIAGNOSIS — F17200 Nicotine dependence, unspecified, uncomplicated: Secondary | ICD-10-CM | POA: Diagnosis present

## 2019-12-06 DIAGNOSIS — Z362 Encounter for other antenatal screening follow-up: Secondary | ICD-10-CM

## 2019-12-06 DIAGNOSIS — O09293 Supervision of pregnancy with other poor reproductive or obstetric history, third trimester: Secondary | ICD-10-CM

## 2019-12-06 DIAGNOSIS — O99333 Smoking (tobacco) complicating pregnancy, third trimester: Secondary | ICD-10-CM | POA: Diagnosis not present

## 2019-12-06 DIAGNOSIS — O99343 Other mental disorders complicating pregnancy, third trimester: Secondary | ICD-10-CM

## 2019-12-06 DIAGNOSIS — O24419 Gestational diabetes mellitus in pregnancy, unspecified control: Secondary | ICD-10-CM | POA: Diagnosis not present

## 2019-12-06 DIAGNOSIS — Z3A33 33 weeks gestation of pregnancy: Secondary | ICD-10-CM

## 2019-12-07 ENCOUNTER — Other Ambulatory Visit: Payer: Self-pay | Admitting: *Deleted

## 2019-12-07 DIAGNOSIS — O24913 Unspecified diabetes mellitus in pregnancy, third trimester: Secondary | ICD-10-CM

## 2019-12-13 ENCOUNTER — Ambulatory Visit: Payer: Medicaid Other | Attending: Obstetrics and Gynecology | Admitting: *Deleted

## 2019-12-13 ENCOUNTER — Other Ambulatory Visit: Payer: Self-pay

## 2019-12-13 ENCOUNTER — Ambulatory Visit: Payer: Medicaid Other | Admitting: *Deleted

## 2019-12-13 VITALS — BP 131/57 | HR 83

## 2019-12-13 DIAGNOSIS — O24414 Gestational diabetes mellitus in pregnancy, insulin controlled: Secondary | ICD-10-CM

## 2019-12-13 DIAGNOSIS — Z3A34 34 weeks gestation of pregnancy: Secondary | ICD-10-CM

## 2019-12-13 NOTE — Procedures (Signed)
Beverly Torres [birth year] [redacted]w[redacted]d  Fetus A Non-Stress Test Interpretation for 12/13/19  Indication: GDM-insulin  Fetal Heart Rate A Mode: External Baseline Rate (A): 155 bpm Variability: Moderate Accelerations: 15 x 15 Decelerations: None Multiple birth?: No  Uterine Activity Mode: Toco Contraction Frequency (min): occassional with uterine irritability Contraction Duration (sec): 60-100 Contraction Quality: Mild Resting Tone Palpated: Relaxed Resting Time: Adequate  Interpretation (Fetal Testing) Nonstress Test Interpretation: Reactive Overall Impression: Reassuring for gestational age Comments: Dr. Annamaria Boots reviewed tracing

## 2019-12-19 ENCOUNTER — Ambulatory Visit: Payer: Medicaid Other | Attending: Obstetrics and Gynecology

## 2019-12-19 ENCOUNTER — Other Ambulatory Visit: Payer: Self-pay | Admitting: *Deleted

## 2019-12-19 ENCOUNTER — Ambulatory Visit: Payer: Medicaid Other | Admitting: *Deleted

## 2019-12-19 ENCOUNTER — Other Ambulatory Visit: Payer: Self-pay

## 2019-12-19 VITALS — BP 131/67 | HR 88

## 2019-12-19 DIAGNOSIS — O24913 Unspecified diabetes mellitus in pregnancy, third trimester: Secondary | ICD-10-CM | POA: Insufficient documentation

## 2019-12-19 DIAGNOSIS — O99213 Obesity complicating pregnancy, third trimester: Secondary | ICD-10-CM | POA: Diagnosis not present

## 2019-12-19 DIAGNOSIS — Z794 Long term (current) use of insulin: Secondary | ICD-10-CM

## 2019-12-19 DIAGNOSIS — Z720 Tobacco use: Secondary | ICD-10-CM

## 2019-12-19 DIAGNOSIS — O24414 Gestational diabetes mellitus in pregnancy, insulin controlled: Secondary | ICD-10-CM | POA: Insufficient documentation

## 2019-12-19 DIAGNOSIS — Z3A35 35 weeks gestation of pregnancy: Secondary | ICD-10-CM

## 2019-12-19 DIAGNOSIS — F99 Mental disorder, not otherwise specified: Secondary | ICD-10-CM

## 2019-12-19 DIAGNOSIS — O09293 Supervision of pregnancy with other poor reproductive or obstetric history, third trimester: Secondary | ICD-10-CM

## 2019-12-19 DIAGNOSIS — O24419 Gestational diabetes mellitus in pregnancy, unspecified control: Secondary | ICD-10-CM

## 2019-12-19 DIAGNOSIS — O99343 Other mental disorders complicating pregnancy, third trimester: Secondary | ICD-10-CM

## 2019-12-19 DIAGNOSIS — O99333 Smoking (tobacco) complicating pregnancy, third trimester: Secondary | ICD-10-CM | POA: Diagnosis not present

## 2019-12-19 DIAGNOSIS — Z362 Encounter for other antenatal screening follow-up: Secondary | ICD-10-CM

## 2019-12-19 DIAGNOSIS — E669 Obesity, unspecified: Secondary | ICD-10-CM

## 2019-12-19 NOTE — Progress Notes (Signed)
Pt very tearful, states "husband left her last Friday."

## 2019-12-26 ENCOUNTER — Ambulatory Visit: Payer: Medicaid Other | Attending: Obstetrics and Gynecology

## 2019-12-26 ENCOUNTER — Encounter: Payer: Self-pay | Admitting: *Deleted

## 2019-12-26 ENCOUNTER — Ambulatory Visit: Payer: Medicaid Other | Admitting: *Deleted

## 2019-12-26 ENCOUNTER — Other Ambulatory Visit: Payer: Self-pay

## 2019-12-26 VITALS — BP 116/47 | HR 70

## 2019-12-26 DIAGNOSIS — O99213 Obesity complicating pregnancy, third trimester: Secondary | ICD-10-CM | POA: Diagnosis not present

## 2019-12-26 DIAGNOSIS — F17200 Nicotine dependence, unspecified, uncomplicated: Secondary | ICD-10-CM | POA: Insufficient documentation

## 2019-12-26 DIAGNOSIS — Z3A36 36 weeks gestation of pregnancy: Secondary | ICD-10-CM

## 2019-12-26 DIAGNOSIS — O99333 Smoking (tobacco) complicating pregnancy, third trimester: Secondary | ICD-10-CM

## 2019-12-26 DIAGNOSIS — O24419 Gestational diabetes mellitus in pregnancy, unspecified control: Secondary | ICD-10-CM | POA: Insufficient documentation

## 2019-12-26 DIAGNOSIS — O09293 Supervision of pregnancy with other poor reproductive or obstetric history, third trimester: Secondary | ICD-10-CM

## 2019-12-26 DIAGNOSIS — O24414 Gestational diabetes mellitus in pregnancy, insulin controlled: Secondary | ICD-10-CM

## 2019-12-26 DIAGNOSIS — O99343 Other mental disorders complicating pregnancy, third trimester: Secondary | ICD-10-CM

## 2019-12-26 DIAGNOSIS — O24913 Unspecified diabetes mellitus in pregnancy, third trimester: Secondary | ICD-10-CM | POA: Diagnosis present

## 2019-12-26 DIAGNOSIS — Z362 Encounter for other antenatal screening follow-up: Secondary | ICD-10-CM

## 2019-12-30 LAB — OB RESULTS CONSOLE GBS: GBS: NEGATIVE

## 2019-12-31 ENCOUNTER — Telehealth (HOSPITAL_COMMUNITY): Payer: Self-pay | Admitting: *Deleted

## 2019-12-31 NOTE — Telephone Encounter (Signed)
Preadmission screen  

## 2020-01-01 ENCOUNTER — Other Ambulatory Visit (HOSPITAL_COMMUNITY)
Admission: RE | Admit: 2020-01-01 | Discharge: 2020-01-01 | Disposition: A | Discharge status: Home / Self Care | Payer: Medicaid Other | Source: Ambulatory Visit | Attending: Obstetrics and Gynecology | Admitting: Obstetrics and Gynecology

## 2020-01-01 ENCOUNTER — Other Ambulatory Visit: Payer: Self-pay | Admitting: Obstetrics and Gynecology

## 2020-01-01 ENCOUNTER — Inpatient Hospital Stay (HOSPITAL_COMMUNITY): Payer: Medicaid Other

## 2020-01-01 DIAGNOSIS — Z01812 Encounter for preprocedural laboratory examination: Secondary | ICD-10-CM | POA: Insufficient documentation

## 2020-01-01 DIAGNOSIS — Z20822 Contact with and (suspected) exposure to covid-19: Secondary | ICD-10-CM | POA: Insufficient documentation

## 2020-01-01 LAB — SARS CORONAVIRUS 2 (TAT 6-24 HRS): SARS Coronavirus 2: NEGATIVE

## 2020-01-02 ENCOUNTER — Other Ambulatory Visit: Payer: Self-pay

## 2020-01-02 ENCOUNTER — Inpatient Hospital Stay (HOSPITAL_COMMUNITY): Payer: Medicaid Other | Admitting: Anesthesiology

## 2020-01-02 ENCOUNTER — Encounter (HOSPITAL_COMMUNITY): Payer: Self-pay | Admitting: Obstetrics and Gynecology

## 2020-01-02 ENCOUNTER — Ambulatory Visit: Payer: Medicaid Other

## 2020-01-02 ENCOUNTER — Inpatient Hospital Stay (HOSPITAL_COMMUNITY): Payer: Medicaid Other

## 2020-01-02 ENCOUNTER — Inpatient Hospital Stay (HOSPITAL_COMMUNITY)
Admission: AD | Admit: 2020-01-02 | Discharge: 2020-01-04 | DRG: 786 | Disposition: A | Discharge status: Home / Self Care | Payer: Medicaid Other | Attending: Obstetrics and Gynecology | Admitting: Obstetrics and Gynecology

## 2020-01-02 ENCOUNTER — Encounter (HOSPITAL_COMMUNITY)
Admission: AD | Disposition: A | Discharge status: Home / Self Care | Payer: Self-pay | Source: Home / Self Care | Attending: Obstetrics and Gynecology

## 2020-01-02 DIAGNOSIS — O4593 Premature separation of placenta, unspecified, third trimester: Secondary | ICD-10-CM | POA: Diagnosis present

## 2020-01-02 DIAGNOSIS — O9081 Anemia of the puerperium: Secondary | ICD-10-CM | POA: Diagnosis not present

## 2020-01-02 DIAGNOSIS — O99334 Smoking (tobacco) complicating childbirth: Secondary | ICD-10-CM | POA: Diagnosis present

## 2020-01-02 DIAGNOSIS — Z20822 Contact with and (suspected) exposure to covid-19: Secondary | ICD-10-CM | POA: Diagnosis present

## 2020-01-02 DIAGNOSIS — Z3A37 37 weeks gestation of pregnancy: Secondary | ICD-10-CM | POA: Diagnosis not present

## 2020-01-02 DIAGNOSIS — O24424 Gestational diabetes mellitus in childbirth, insulin controlled: Secondary | ICD-10-CM | POA: Diagnosis present

## 2020-01-02 DIAGNOSIS — O99214 Obesity complicating childbirth: Secondary | ICD-10-CM | POA: Diagnosis present

## 2020-01-02 DIAGNOSIS — D62 Acute posthemorrhagic anemia: Secondary | ICD-10-CM | POA: Diagnosis not present

## 2020-01-02 DIAGNOSIS — F17210 Nicotine dependence, cigarettes, uncomplicated: Secondary | ICD-10-CM | POA: Diagnosis present

## 2020-01-02 DIAGNOSIS — O24419 Gestational diabetes mellitus in pregnancy, unspecified control: Secondary | ICD-10-CM | POA: Diagnosis present

## 2020-01-02 LAB — CBC
HCT: 36.7 % (ref 36.0–46.0)
Hemoglobin: 11.9 g/dL — ABNORMAL LOW (ref 12.0–15.0)
MCH: 30.4 pg (ref 26.0–34.0)
MCHC: 32.4 g/dL (ref 30.0–36.0)
MCV: 93.6 fL (ref 80.0–100.0)
Platelets: 213 10*3/uL (ref 150–400)
RBC: 3.92 MIL/uL (ref 3.87–5.11)
RDW: 13.5 % (ref 11.5–15.5)
WBC: 12.8 10*3/uL — ABNORMAL HIGH (ref 4.0–10.5)
nRBC: 0 % (ref 0.0–0.2)

## 2020-01-02 LAB — GLUCOSE, CAPILLARY
Glucose-Capillary: 106 mg/dL — ABNORMAL HIGH (ref 70–99)
Glucose-Capillary: 107 mg/dL — ABNORMAL HIGH (ref 70–99)
Glucose-Capillary: 110 mg/dL — ABNORMAL HIGH (ref 70–99)
Glucose-Capillary: 110 mg/dL — ABNORMAL HIGH (ref 70–99)
Glucose-Capillary: 112 mg/dL — ABNORMAL HIGH (ref 70–99)
Glucose-Capillary: 116 mg/dL — ABNORMAL HIGH (ref 70–99)
Glucose-Capillary: 119 mg/dL — ABNORMAL HIGH (ref 70–99)
Glucose-Capillary: 108 mg/dL — ABNORMAL HIGH (ref 70–99)
Glucose-Capillary: 127 mg/dL — ABNORMAL HIGH (ref 70–99)
Glucose-Capillary: 123 mg/dL — ABNORMAL HIGH (ref 70–99)

## 2020-01-02 LAB — CBC WITH DIFFERENTIAL/PLATELET
Abs Immature Granulocytes: 0.11 10*3/uL — ABNORMAL HIGH (ref 0.00–0.07)
Basophils Absolute: 0 10*3/uL (ref 0.0–0.1)
Basophils Relative: 0 %
Eosinophils Absolute: 0 10*3/uL (ref 0.0–0.5)
Eosinophils Relative: 0 %
HCT: 27.5 % — ABNORMAL LOW (ref 36.0–46.0)
Hemoglobin: 9 g/dL — ABNORMAL LOW (ref 12.0–15.0)
Immature Granulocytes: 1 %
Lymphocytes Relative: 7 %
Lymphs Abs: 1.2 10*3/uL (ref 0.7–4.0)
MCH: 30.2 pg (ref 26.0–34.0)
MCHC: 32.7 g/dL (ref 30.0–36.0)
MCV: 92.3 fL (ref 80.0–100.0)
Monocytes Absolute: 1 10*3/uL (ref 0.1–1.0)
Monocytes Relative: 6 %
Neutro Abs: 14.7 10*3/uL — ABNORMAL HIGH (ref 1.7–7.7)
Neutrophils Relative %: 86 %
Platelets: 224 10*3/uL (ref 150–400)
RBC: 2.98 MIL/uL — ABNORMAL LOW (ref 3.87–5.11)
RDW: 13.7 % (ref 11.5–15.5)
WBC: 17 10*3/uL — ABNORMAL HIGH (ref 4.0–10.5)
nRBC: 0 % (ref 0.0–0.2)

## 2020-01-02 LAB — HEMOGLOBIN A1C
Hgb A1c MFr Bld: 5.7 % — ABNORMAL HIGH (ref 4.8–5.6)
Mean Plasma Glucose: 116.89 mg/dL

## 2020-01-02 LAB — COMPREHENSIVE METABOLIC PANEL
ALT: 33 U/L (ref 0–44)
AST: 15 U/L (ref 15–41)
Albumin: 2.5 g/dL — ABNORMAL LOW (ref 3.5–5.0)
Alkaline Phosphatase: 138 U/L — ABNORMAL HIGH (ref 38–126)
Anion gap: 10 (ref 5–15)
BUN: 10 mg/dL (ref 6–20)
CO2: 21 mmol/L — ABNORMAL LOW (ref 22–32)
Calcium: 9.2 mg/dL (ref 8.9–10.3)
Chloride: 105 mmol/L (ref 98–111)
Creatinine, Ser: 0.8 mg/dL (ref 0.44–1.00)
GFR, Estimated: >60 mL/min (ref >60)
Glucose, Bld: 106 mg/dL — ABNORMAL HIGH (ref 70–99)
Potassium: 4.1 mmol/L (ref 3.5–5.1)
Sodium: 136 mmol/L (ref 135–145)
Total Bilirubin: 0.4 mg/dL (ref 0.3–1.2)
Total Protein: 5.7 g/dL — ABNORMAL LOW (ref 6.5–8.1)

## 2020-01-02 LAB — PREPARE RBC (CROSSMATCH)

## 2020-01-02 LAB — RPR: RPR Ser Ql: NONREACTIVE

## 2020-01-02 SURGERY — Surgical Case
Anesthesia: Spinal

## 2020-01-02 MED ORDER — CEFAZOLIN SODIUM-DEXTROSE 2-4 GM/100ML-% IV SOLN
INTRAVENOUS | Status: AC
  Filled 2020-01-02: qty 100

## 2020-01-02 MED ORDER — LACTATED RINGERS IV SOLN: 500.0000 mL | INTRAVENOUS | Status: DC | PRN

## 2020-01-02 MED ORDER — PHENYLEPHRINE HCL-NACL 20-0.9 MG/250ML-% IV SOLN
INTRAVENOUS | Status: AC
  Filled 2020-01-02: qty 250

## 2020-01-02 MED ORDER — MISOPROSTOL 200 MCG PO TABS
ORAL_TABLET | ORAL | Status: AC
  Filled 2020-01-02: qty 5

## 2020-01-02 MED ORDER — CEFAZOLIN SODIUM-DEXTROSE 2-3 GM-%(50ML) IV SOLR
INTRAVENOUS | Status: DC | PRN
  Administered 2020-01-02: 2 g via INTRAVENOUS

## 2020-01-02 MED ORDER — OXYTOCIN-SODIUM CHLORIDE 30-0.9 UT/500ML-% IV SOLN
INTRAVENOUS | Status: AC
  Filled 2020-01-02: qty 500

## 2020-01-02 MED ORDER — DEXTROSE 50 % IV SOLN: 0.0000 mL | INTRAVENOUS | Status: DC | PRN

## 2020-01-02 MED ORDER — TERBUTALINE SULFATE 1 MG/ML IJ SOLN: 0.2500 mg | Freq: Once | INTRAMUSCULAR | Status: DC | PRN

## 2020-01-02 MED ORDER — OXYCODONE-ACETAMINOPHEN 5-325 MG PO TABS: 1.0000 | ORAL_TABLET | ORAL | Status: DC | PRN

## 2020-01-02 MED ORDER — MORPHINE SULFATE (PF) 0.5 MG/ML IJ SOLN
INTRAMUSCULAR | Status: DC | PRN
  Administered 2020-01-02: 150 ug via INTRATHECAL

## 2020-01-02 MED ORDER — KETOROLAC TROMETHAMINE 30 MG/ML IJ SOLN
INTRAMUSCULAR | Status: AC
  Filled 2020-01-02: qty 1

## 2020-01-02 MED ORDER — OXYTOCIN-SODIUM CHLORIDE 30-0.9 UT/500ML-% IV SOLN: 2.5000 [IU]/h | INTRAVENOUS | Status: DC

## 2020-01-02 MED ORDER — INSULIN REGULAR(HUMAN) IN NACL 100-0.9 UT/100ML-% IV SOLN
INTRAVENOUS | Status: DC
  Administered 2020-01-02: 0.9 [IU]/h via INTRAVENOUS
  Filled 2020-01-02: qty 100

## 2020-01-02 MED ORDER — LIDOCAINE HCL (PF) 1 % IJ SOLN: 30.0000 mL | INTRAMUSCULAR | Status: DC | PRN

## 2020-01-02 MED ORDER — SOD CITRATE-CITRIC ACID 500-334 MG/5ML PO SOLN
30.0000 mL | Freq: Once | ORAL | Status: AC
  Administered 2020-01-02: 30 mL via ORAL

## 2020-01-02 MED ORDER — OXYCODONE HCL 5 MG PO TABS: 5.0000 mg | ORAL_TABLET | ORAL | Status: DC | PRN

## 2020-01-02 MED ORDER — MISOPROSTOL 200 MCG PO TABS
1000.0000 ug | ORAL_TABLET | ORAL | Status: AC
  Administered 2020-01-02: 1000 ug via RECTAL

## 2020-01-02 MED ORDER — LACTATED RINGERS IV SOLN: INTRAVENOUS | Status: DC

## 2020-01-02 MED ORDER — SIMETHICONE 80 MG PO CHEW
80.0000 mg | CHEWABLE_TABLET | Freq: Three times a day (TID) | ORAL | Status: DC
  Administered 2020-01-02 – 2020-01-04 (×5): 80 mg via ORAL
  Filled 2020-01-02 (×5): qty 1

## 2020-01-02 MED ORDER — ONDANSETRON HCL 4 MG/2ML IJ SOLN
4.0000 mg | Freq: Four times a day (QID) | INTRAMUSCULAR | Status: DC | PRN
  Administered 2020-01-02: 4 mg via INTRAVENOUS

## 2020-01-02 MED ORDER — SCOPOLAMINE 1 MG/3DAYS TD PT72: 1.0000 | MEDICATED_PATCH | Freq: Once | TRANSDERMAL | Status: DC

## 2020-01-02 MED ORDER — SODIUM CHLORIDE 0.9% FLUSH: 3.0000 mL | INTRAVENOUS | Status: DC | PRN

## 2020-01-02 MED ORDER — MAGNESIUM HYDROXIDE 400 MG/5ML PO SUSP: 30.0000 mL | ORAL | Status: DC | PRN

## 2020-01-02 MED ORDER — PHENYLEPHRINE HCL-NACL 20-0.9 MG/250ML-% IV SOLN
INTRAVENOUS | Status: DC | PRN
  Administered 2020-01-02: 60 ug/min via INTRAVENOUS

## 2020-01-02 MED ORDER — NALBUPHINE HCL 10 MG/ML IJ SOLN: 5.0000 mg | INTRAMUSCULAR | Status: DC | PRN

## 2020-01-02 MED ORDER — METHYLERGONOVINE MALEATE 0.2 MG/ML IJ SOLN: 0.2000 mg | Freq: Once | INTRAMUSCULAR | Status: DC

## 2020-01-02 MED ORDER — NALOXONE HCL 4 MG/10ML IJ SOLN
1.0000 ug/kg/h | INTRAVENOUS | Status: DC | PRN
  Filled 2020-01-02: qty 5

## 2020-01-02 MED ORDER — WITCH HAZEL-GLYCERIN EX PADS: 1.0000 "application " | MEDICATED_PAD | CUTANEOUS | Status: DC | PRN

## 2020-01-02 MED ORDER — SENNOSIDES-DOCUSATE SODIUM 8.6-50 MG PO TABS
2.0000 | ORAL_TABLET | ORAL | Status: DC
  Administered 2020-01-03 – 2020-01-04 (×2): 2 via ORAL
  Filled 2020-01-02 (×2): qty 2

## 2020-01-02 MED ORDER — HYDROMORPHONE HCL 1 MG/ML IJ SOLN: 0.2500 mg | INTRAMUSCULAR | Status: DC | PRN

## 2020-01-02 MED ORDER — DIPHENHYDRAMINE HCL 25 MG PO CAPS: 25.0000 mg | ORAL_CAPSULE | Freq: Four times a day (QID) | ORAL | Status: DC | PRN

## 2020-01-02 MED ORDER — SIMETHICONE 80 MG PO CHEW: 80.0000 mg | CHEWABLE_TABLET | ORAL | Status: DC | PRN

## 2020-01-02 MED ORDER — DIBUCAINE (PERIANAL) 1 % EX OINT: 1.0000 "application " | TOPICAL_OINTMENT | CUTANEOUS | Status: DC | PRN

## 2020-01-02 MED ORDER — MISOPROSTOL 25 MCG QUARTER TABLET
25.0000 ug | ORAL_TABLET | ORAL | Status: DC | PRN
  Administered 2020-01-02: 25 ug via VAGINAL
  Filled 2020-01-02: qty 1

## 2020-01-02 MED ORDER — DIPHENHYDRAMINE HCL 50 MG/ML IJ SOLN: 12.5000 mg | INTRAMUSCULAR | Status: DC | PRN

## 2020-01-02 MED ORDER — DEXAMETHASONE SODIUM PHOSPHATE 4 MG/ML IJ SOLN
INTRAMUSCULAR | Status: AC
  Filled 2020-01-02: qty 1

## 2020-01-02 MED ORDER — MENTHOL 3 MG MT LOZG: 1.0000 | LOZENGE | OROMUCOSAL | Status: DC | PRN

## 2020-01-02 MED ORDER — SODIUM CHLORIDE 0.9% IV SOLUTION: Freq: Once | INTRAVENOUS | Status: DC

## 2020-01-02 MED ORDER — SOD CITRATE-CITRIC ACID 500-334 MG/5ML PO SOLN: 30.0000 mL | ORAL | Status: DC | PRN

## 2020-01-02 MED ORDER — ACETAMINOPHEN 325 MG PO TABS: 650.0000 mg | ORAL_TABLET | ORAL | Status: DC | PRN

## 2020-01-02 MED ORDER — METHYLERGONOVINE MALEATE 0.2 MG PO TABS
0.2000 mg | ORAL_TABLET | Freq: Four times a day (QID) | ORAL | Status: AC
  Administered 2020-01-02 – 2020-01-03 (×4): 0.2 mg via ORAL
  Filled 2020-01-02 (×4): qty 1

## 2020-01-02 MED ORDER — SODIUM CHLORIDE 0.9 % IV SOLN
INTRAVENOUS | Status: AC
  Filled 2020-01-02: qty 500

## 2020-01-02 MED ORDER — SIMETHICONE 80 MG PO CHEW
80.0000 mg | CHEWABLE_TABLET | ORAL | Status: DC
  Administered 2020-01-03 – 2020-01-04 (×2): 80 mg via ORAL
  Filled 2020-01-02 (×2): qty 1

## 2020-01-02 MED ORDER — FENTANYL CITRATE (PF) 100 MCG/2ML IJ SOLN: 50.0000 ug | INTRAMUSCULAR | Status: DC | PRN

## 2020-01-02 MED ORDER — FENTANYL CITRATE (PF) 100 MCG/2ML IJ SOLN
INTRAMUSCULAR | Status: DC | PRN
  Administered 2020-01-02: 15 ug via INTRATHECAL

## 2020-01-02 MED ORDER — OXYTOCIN-SODIUM CHLORIDE 30-0.9 UT/500ML-% IV SOLN
1.0000 m[IU]/min | INTRAVENOUS | Status: DC
  Administered 2020-01-02: 2 m[IU]/min via INTRAVENOUS
  Filled 2020-01-02: qty 500

## 2020-01-02 MED ORDER — MEPERIDINE HCL 25 MG/ML IJ SOLN: 6.2500 mg | INTRAMUSCULAR | Status: DC | PRN

## 2020-01-02 MED ORDER — KETOROLAC TROMETHAMINE 30 MG/ML IJ SOLN
30.0000 mg | Freq: Once | INTRAMUSCULAR | Status: AC | PRN
  Administered 2020-01-02: 30 mg via INTRAVENOUS

## 2020-01-02 MED ORDER — FENTANYL CITRATE (PF) 100 MCG/2ML IJ SOLN
50.0000 ug | Freq: Once | INTRAMUSCULAR | Status: AC
  Administered 2020-01-02: 50 ug via INTRAVENOUS
  Filled 2020-01-02: qty 2

## 2020-01-02 MED ORDER — MORPHINE SULFATE (PF) 0.5 MG/ML IJ SOLN
INTRAMUSCULAR | Status: AC
  Filled 2020-01-02: qty 10

## 2020-01-02 MED ORDER — KETOROLAC TROMETHAMINE 30 MG/ML IJ SOLN
30.0000 mg | Freq: Four times a day (QID) | INTRAMUSCULAR | Status: AC
  Administered 2020-01-02 – 2020-01-03 (×2): 30 mg via INTRAVENOUS
  Filled 2020-01-02 (×4): qty 1

## 2020-01-02 MED ORDER — NALBUPHINE HCL 10 MG/ML IJ SOLN
5.0000 mg | Freq: Once | INTRAMUSCULAR | Status: AC | PRN
  Administered 2020-01-02: 5 mg via INTRAVENOUS

## 2020-01-02 MED ORDER — ONDANSETRON HCL 4 MG/2ML IJ SOLN: 4.0000 mg | Freq: Once | INTRAMUSCULAR | Status: DC | PRN

## 2020-01-02 MED ORDER — DEXAMETHASONE SODIUM PHOSPHATE 4 MG/ML IJ SOLN
INTRAMUSCULAR | Status: DC | PRN
  Administered 2020-01-02: 4 mg via INTRAVENOUS

## 2020-01-02 MED ORDER — NALOXONE HCL 0.4 MG/ML IJ SOLN: 0.4000 mg | INTRAMUSCULAR | Status: DC | PRN

## 2020-01-02 MED ORDER — IBUPROFEN 800 MG PO TABS: 800.0000 mg | ORAL_TABLET | Freq: Four times a day (QID) | ORAL | Status: DC

## 2020-01-02 MED ORDER — ONDANSETRON HCL 4 MG/2ML IJ SOLN
INTRAMUSCULAR | Status: AC
  Filled 2020-01-02: qty 2

## 2020-01-02 MED ORDER — OXYCODONE-ACETAMINOPHEN 5-325 MG PO TABS: 2.0000 | ORAL_TABLET | ORAL | Status: DC | PRN

## 2020-01-02 MED ORDER — NALBUPHINE HCL 10 MG/ML IJ SOLN
INTRAMUSCULAR | Status: AC
  Filled 2020-01-02: qty 1

## 2020-01-02 MED ORDER — HYDROMORPHONE HCL 1 MG/ML IJ SOLN: 0.2000 mg | INTRAMUSCULAR | Status: DC | PRN

## 2020-01-02 MED ORDER — BUPIVACAINE IN DEXTROSE 0.75-8.25 % IT SOLN
INTRATHECAL | Status: DC | PRN
  Administered 2020-01-02: 1.4 mL via INTRATHECAL

## 2020-01-02 MED ORDER — OXYTOCIN-SODIUM CHLORIDE 30-0.9 UT/500ML-% IV SOLN
INTRAVENOUS | Status: DC | PRN
  Administered 2020-01-02: 30 [IU] via INTRAVENOUS

## 2020-01-02 MED ORDER — COCONUT OIL OIL: 1.0000 "application " | TOPICAL_OIL | Status: DC | PRN

## 2020-01-02 MED ORDER — ACETAMINOPHEN 500 MG PO TABS
1000.0000 mg | ORAL_TABLET | Freq: Four times a day (QID) | ORAL | Status: DC
  Administered 2020-01-02 – 2020-01-04 (×7): 1000 mg via ORAL
  Filled 2020-01-02 (×8): qty 2

## 2020-01-02 MED ORDER — FENTANYL CITRATE (PF) 100 MCG/2ML IJ SOLN
INTRAMUSCULAR | Status: AC
  Filled 2020-01-02: qty 2

## 2020-01-02 MED ORDER — OXYTOCIN BOLUS FROM INFUSION: 333.0000 mL | Freq: Once | INTRAVENOUS | Status: DC

## 2020-01-02 MED ORDER — METHYLERGONOVINE MALEATE 0.2 MG/ML IJ SOLN
INTRAMUSCULAR | Status: AC
  Administered 2020-01-02: 0.2 mg via INTRAMUSCULAR
  Filled 2020-01-02: qty 1

## 2020-01-02 MED ORDER — SOD CITRATE-CITRIC ACID 500-334 MG/5ML PO SOLN
ORAL | Status: AC
  Filled 2020-01-02: qty 15

## 2020-01-02 MED ORDER — LACTATED RINGERS IV SOLN: INTRAVENOUS | Status: DC | PRN

## 2020-01-02 MED ORDER — DIPHENHYDRAMINE HCL 25 MG PO CAPS: 25.0000 mg | ORAL_CAPSULE | ORAL | Status: DC | PRN

## 2020-01-02 MED ORDER — NALBUPHINE HCL 10 MG/ML IJ SOLN: 5.0000 mg | Freq: Once | INTRAMUSCULAR | Status: AC | PRN

## 2020-01-02 MED ORDER — SODIUM CHLORIDE 0.9 % IV SOLN
500.0000 mg | INTRAVENOUS | Status: AC
  Administered 2020-01-02: 500 mg via INTRAVENOUS

## 2020-01-02 MED ORDER — METHYLERGONOVINE MALEATE 0.2 MG/ML IJ SOLN: 0.2000 mg | Freq: Once | INTRAMUSCULAR | Status: AC

## 2020-01-02 MED ORDER — DEXTROSE IN LACTATED RINGERS 5 % IV SOLN: INTRAVENOUS | Status: DC

## 2020-01-02 MED ORDER — PRENATAL MULTIVITAMIN CH
1.0000 | ORAL_TABLET | Freq: Every day | ORAL | Status: DC
  Administered 2020-01-03 – 2020-01-04 (×2): 1 via ORAL
  Filled 2020-01-02 (×2): qty 1

## 2020-01-02 MED ORDER — ONDANSETRON HCL 4 MG/2ML IJ SOLN: 4.0000 mg | Freq: Three times a day (TID) | INTRAMUSCULAR | Status: DC | PRN

## 2020-01-02 MED ORDER — OXYTOCIN-SODIUM CHLORIDE 30-0.9 UT/500ML-% IV SOLN
2.5000 [IU]/h | INTRAVENOUS | Status: AC
  Administered 2020-01-02: 2.5 [IU]/h via INTRAVENOUS
  Filled 2020-01-02: qty 500

## 2020-01-02 SURGICAL SUPPLY — 34 items
APL SKNCLS STERI-STRIP NONHPOA (GAUZE/BANDAGES/DRESSINGS) ×2
BENZOIN TINCTURE PRP APPL 2/3 (GAUZE/BANDAGES/DRESSINGS) ×4 IMPLANT
CLAMP CORD UMBIL (MISCELLANEOUS) ×2 IMPLANT
CLOSURE STERI STRIP 1/2 X4 (GAUZE/BANDAGES/DRESSINGS) ×2 IMPLANT
CLOSURE WOUND 1/4X4 (GAUZE/BANDAGES/DRESSINGS) ×1
DRSG OPSITE POSTOP 4X10 (GAUZE/BANDAGES/DRESSINGS) ×4 IMPLANT
ELECT REM PT RETURN 9FT ADLT (ELECTROSURGICAL) ×3
ELECTRODE REM PT RTRN 9FT ADLT (ELECTROSURGICAL) IMPLANT
EXTRACTOR VACUUM BELL STYLE (SUCTIONS) IMPLANT
GLOVE BIOGEL PI IND STRL 6.5 (GLOVE) IMPLANT
GLOVE BIOGEL PI IND STRL 7.0 (GLOVE) IMPLANT
GLOVE BIOGEL PI INDICATOR 6.5 (GLOVE) ×4
GLOVE BIOGEL PI INDICATOR 7.0 (GLOVE) ×4
GOWN STRL REUS W/TWL LRG LVL3 (GOWN DISPOSABLE) ×2 IMPLANT
HOVERMATT SINGLE USE (MISCELLANEOUS) ×2 IMPLANT
KIT ABG SYR 3ML LUER SLIP (SYRINGE) ×2 IMPLANT
NDL HYPO 25X5/8 SAFETYGLIDE (NEEDLE) IMPLANT
NEEDLE HYPO 25X5/8 SAFETYGLIDE (NEEDLE) ×6 IMPLANT
NS IRRIG 1000ML POUR BTL (IV SOLUTION) ×2 IMPLANT
PACK C SECTION WH (CUSTOM PROCEDURE TRAY) ×2 IMPLANT
PAD OB MATERNITY 4.3X12.25 (PERSONAL CARE ITEMS) ×2 IMPLANT
RETRACTOR TRAXI PANNICULUS (MISCELLANEOUS) IMPLANT
RTRCTR C-SECT PINK 25CM LRG (MISCELLANEOUS) ×2 IMPLANT
STRIP CLOSURE SKIN 1/4X4 (GAUZE/BANDAGES/DRESSINGS) ×1 IMPLANT
SUT MNCRL 0 VIOLET CTX 36 (SUTURE) IMPLANT
SUT MONOCRYL 0 CTX 36 (SUTURE) ×3
SUT VIC AB 0 CT1 36 (SUTURE) ×4 IMPLANT
SUT VIC AB 3-0 CT1 27 (SUTURE) ×6
SUT VIC AB 3-0 CT1 TAPERPNT 27 (SUTURE) IMPLANT
SUT VIC AB 4-0 KS 27 (SUTURE) ×2 IMPLANT
TOWEL OR 17X24 6PK STRL BLUE (TOWEL DISPOSABLE) ×2 IMPLANT
TRAXI PANNICULUS RETRACTOR (MISCELLANEOUS) ×2
TRAY FOLEY W/BAG SLVR 14FR LF (SET/KITS/TRAYS/PACK) ×2 IMPLANT
WATER STERILE IRR 1000ML POUR (IV SOLUTION) ×2 IMPLANT

## 2020-01-02 NOTE — Progress Notes (Signed)
Called by RN re: vaginal bleeding and recurrent late decelerations  I assessed patient at bedside - large amount of blood on chucks (measured 854mL with triton) and recurrent late decelerations, baseline increased to 160bpm, mod variability. SVE 5.5/50/-3. Pitocin turned off.  Discussed with patient, nonreassuring fetal heart tracing in setting of heavy vaginal bleeding, recommend primary c-section due to concern for placental abruption and remote from delivery. Risks reviewed with patient and her partner, including infection, bleeding, damage to surrounding organs, need for blood transfusion. Consent signed. All questions answered.   Ancef/Azithro. NPO Plan for spinal placement in OR

## 2020-01-02 NOTE — Transfer of Care (Signed)
Immediate Anesthesia Transfer of Care Note  Patient: Beverly Torres  Procedure(s) Performed: CESAREAN SECTION  Patient Location: PACU  Anesthesia Type:Spinal  Level of Consciousness: awake  Airway & Oxygen Therapy: Patient Spontanous Breathing  Post-op Assessment: Report given to RN and Post -op Vital signs reviewed and stable  Post vital signs: Reviewed and stable  Last Vitals:  Vitals Value Taken Time  BP 111/48 01/02/20 1117  Temp    Pulse 96 01/02/20 1120  Resp 17 01/02/20 1120  SpO2 98 % 01/02/20 1120  Vitals shown include unvalidated device data.  Last Pain:  Vitals:   01/02/20 0751  TempSrc:   PainSc: Asleep         Complications: No complications documented.

## 2020-01-02 NOTE — Anesthesia Preprocedure Evaluation (Signed)
Anesthesia Evaluation  Patient identified by MRN, date of birth, ID band  Reviewed: Patient's Chart, lab work & pertinent test results  Airway Mallampati: II  TM Distance: >3 FB Neck ROM: Full    Dental  (+) Teeth Intact   Pulmonary neg pulmonary ROS, Current Smoker,    Pulmonary exam normal        Cardiovascular negative cardio ROS   Rhythm:Regular Rate:Normal     Neuro/Psych Anxiety Depression Schizophrenia negative neurological ROS     GI/Hepatic Neg liver ROS, GERD  ,  Endo/Other  diabetes, Well Controlled, Gestational  Renal/GU negative Renal ROS  negative genitourinary   Musculoskeletal negative musculoskeletal ROS (+)   Abdominal (+)  Abdomen: soft. Bowel sounds: normal.  Peds  Hematology negative hematology ROS (+)   Anesthesia Other Findings   Reproductive/Obstetrics (+) Pregnancy                             Anesthesia Physical Anesthesia Plan  ASA: III  Anesthesia Plan: Spinal   Post-op Pain Management:    Induction:   PONV Risk Score and Plan: 1 and Ondansetron and Dexamethasone  Airway Management Planned: Natural Airway, Nasal Cannula and Simple Face Mask  Additional Equipment: None  Intra-op Plan:   Post-operative Plan:   Informed Consent: I have reviewed the patients History and Physical, chart, labs and discussed the procedure including the risks, benefits and alternatives for the proposed anesthesia with the patient or authorized representative who has indicated his/her understanding and acceptance.     Dental advisory given  Plan Discussed with: CRNA  Anesthesia Plan Comments: (Lab Results      Component                Value               Date                      WBC                      12.8 (H)            01/02/2020                HGB                      11.9 (L)            01/02/2020                HCT                      36.7                 01/02/2020                MCV                      93.6                01/02/2020                PLT                      213                 01/02/2020          )  Anesthesia Quick Evaluation  

## 2020-01-02 NOTE — Progress Notes (Signed)
   01/02/20 0805  Output (mL)  Estimated Blood Loss 816

## 2020-01-02 NOTE — H&P (Signed)
H&P written retroactively due to need for patient care.  Beverly Torres is a 24Y [id number] @ [redacted]w[redacted]d who was admitted overnight for a scheduled induction of labor for poorly controlled A2 GDM.  Has been noncompliant with insulin and it is unclear how frequently she is taking it. She saw Dr. Annamaria Boots (MFM) for a visit on 11/17 and due to poor control of diabetes and EFW 98%ile, was recommended for delivery at 37 weeks.   Overnight, she was started on cytotec. This morning, around 8am, was noted to have recurrent late decelerations and heavy vaginal bleeding, with exam 5.5/50/-3. Pitocin was turned off. Fetal tracing demonstrated improvement with still present but less frequent decelerations, however continued to have heavy vaginal bleeding so was consented for a c-section. While waiting for the OR to be prepared, recheck cervical exam was unchanged. See operative note.   OB History    Gravida  3   Para  2   Term  2   Preterm      AB  1   Living  2     SAB  1   TAB      Ectopic      Multiple  0   Live Births  2          Past Medical History:  Diagnosis Date  . Acid reflux   . Anxiety   . Attention deficit disorder   . Chronic chest pain   . Depression   . Diabetes mellitus without complication ([place])   . Endometriosis   . Fibroid   . Gestational diabetes   . Morbid obesity with BMI of 40.0-44.9, adult ([place])   . PCOS (polycystic ovarian syndrome)   . Vaginal Pap smear, abnormal    Past Surgical History:  Procedure Laterality Date  . ADENOIDECTOMY    . TONSILLECTOMY    . WISDOM TOOTH EXTRACTION     Family History: family history includes ADD / ADHD in her brother, father, and sister; Anxiety disorder in her mother and sister; Arthritis in her maternal grandfather; Cancer in her maternal grandfather; Diabetes in her maternal grandfather and paternal grandfather; Drug abuse in her father; Hypertension in her maternal grandfather; Kidney failure in her maternal grandmother;  ODD in her brother; Rheum arthritis in her maternal grandmother. Social History:  reports that she has been smoking cigarettes. She has a 8.00 pack-year smoking history. She has never used smokeless tobacco. She reports that she does not drink alcohol and does not use drugs.     Maternal Diabetes: Yes:  Diabetes Type:  Insulin/Medication controlled Genetic Screening: Normal Fetal Ultrasounds or other Referrals:  Referred to Maternal Fetal Medicine due to poor complicance with insulin Maternal Substance Abuse:  No Significant Maternal Medications:  Meds include: Other: NPH Significant Maternal Lab Results:  Group B Strep negative Other Comments:  None  Review of Systems Per HPI Exam Physical Exam  Dilation: 6 Effacement (%): 50 Station: -3 Exam by:: Elion Hocker Blood pressure 108/73, pulse 76, temperature 97.9 F (36.6 C), temperature source Oral, resp. rate 14, height 5\' 2"  (1.575 m), weight 102.6 kg, last menstrual period 04/17/2019, SpO2 98 %, unknown if currently breastfeeding. NAD, resting comfortably  Prenatal labs: ABO, Rh:  --/--/A POS (11/24 0035) Antibody: NEG (11/24 0035) Rubella: Immune (05/05 0000) RPR: NON REACTIVE (11/24 0035)  HBsAg: Negative (05/05 0000)  HIV: Non-reactive (05/05 0000)  GBS: Negative/-- (11/21 0000)   Assessment/Plan: 45W [id number] now postoperative day 0 from primary c-section for concern for placental abruption  in setting of heavy vaginal bleeding and cat II tracing. - Routine postop care - GDM: monitor postoperative blood sugars per protocol - Will check CBC this afternoon due to preop EBL ~1L and intraop EBL 660ml - Social work consult due to poor compliance with diabetes treatment - Desires neonatal circ - Rh pos, Rubella immune, Tdap given prenatally  Rowland Lathe 01/02/2020, 12:29 PM

## 2020-01-02 NOTE — Progress Notes (Addendum)
Called by RN due to dark clots passed with fundal checks - total ~338mL. Ordered methergine 0.2mg  IV   Assessed patient at bedside. She states she feels tired, not complaining of pain.   Fentanyl 50 IV given prior to exam. On bimanual exam: fundus firm, ~251mL thick dark clots evacuated from lower uterine segment. No active bright red bleeding. Miso 1032mcg administered per rectum.  General: pale appearing, conversant, resting with eyes closed  H/H 9.0/27.5 (from 11.9/36.7)  Discussed with patient concern for blood loss throughout the day (almost 1016mL prior to delivery, 665mL intraop, now additional 539mL) and anticipate H/H will drop further, anticipate <7 in AM. Recommend 2U pRBC, patient agrees with plan.   Continue frequent fundal checks and blood glucose monitoring.   ADDENDUM 1718: Subsequent fundal check with scant blood expressed. Will continue to monitor closely.

## 2020-01-02 NOTE — Op Note (Signed)
Procedure(s): CESAREAN SECTION Procedure Note  CONTINA STRAIN female 24 y.o. 01/02/2020  Procedure(s) and Anesthesia Type:    * CESAREAN SECTION - Regional  Surgeon(s) and Role:   * Irene Pap MD - Primary   * Langley Gauss DO - Assist   Indications: MAIRELY FOXWORTH 24 y.o. [id number] @ [redacted]w[redacted]d, suspected abruption with heavy vaginal bleeding in setting of category 2 tracing and remote from delivery     Surgeon: Rowland Lathe   Anesthesia: Spinal   CESAREAN SECTION  Findings: Normal mullerian anatomy.  Viable female infant with weight 4010g (8 pounds 13.5 ounces) Apgars 8 and 8.  Placenta delivered immediately after delivery of infant with large clot  Estimated Blood Loss:  600         Specimens: Placenta to pathology         Complications:  None         Disposition: PACU - hemodynamically stable.         Condition: stable    Description of Procedure: The patient was taken to the operating room where spinal anesthesia was placed and found to be adequate.  The patient was placed in the dorsal supine position.  Fetal heart tones were confirmed. The traxie pannus retractor was applied. The patient was subsequently prepped and draped in the normal sterile fashion and foley catheter inserted.   A low transverse skin incision was made with a scalpel and carried down to the level of the fascia with the Bovie.  The fascia was incised in the midline with the scalpel and extended laterally with curved Mayo scissors.  Kocher clamps were applied to the inferior fascial edge and the fascia was dissected off the rectus muscle sharply with scissors.  The Kocher clamps were transferred to the superior fascial edge and the underlying rectus muscle was dissected off with curved Mayo's scissors.  The rectus muscles then were separated in the midline.  The peritoneum was found free of adherent bowel and the peritoneal cavity was entered bluntly sharply with Metzenbaum scissors.  The  uterus was identified and the alexis retractor was placed intraperitoneal.  A bladder flap was then created sharply with Metzenbaum scissors and separated from the lower uterine segment digitally.   A low transverse hysterotomy was then made with a scalpel.  The infant was found in the cephalic presentation was delivered atraumatically and without difficulty in the usual fashion. The placenta delivered spontaneously immediately following delivery of the infant, so the cord was cut and clamped. Cord gases were obtained. The uterus was cleared of all clot and debris.  The hysterotomy was then closed with 0 monocryl in a running locked fashion,  followed by 0 Monocryl in an imbricating fashion. A single figure of 8 stitch of 0 vicryl was used to obtain hemostasis at the right hysterotomy angle. The hysterotomy was found to be hemostatic.  The fascia was closed with a 0 Vicryl suture in a continuous running fashion from each side. The subcutaneous tissue was irrigated and rendered hemostatic with cautery.  The subcutaneous layer was subsequently closed with 3-0 Vicryl in a continuous running fashion.  The skin was closed with 4-0 vicryl in a running subcuticular fashion.  Sponge, lap and needle counts were correct. Steri strips and honeycomb dressing was placed on the incision.   Rowland Lathe 01/02/20 12:28 PM

## 2020-01-02 NOTE — Progress Notes (Deleted)
Glu 23, 34ml glucose given, infant sts, breastfeeding cbg ordered for 1535 and 1735

## 2020-01-02 NOTE — Progress Notes (Signed)
   01/02/20 1536 01/02/20 1627  [place]  Estimated Blood Loss 145 83  Md notified, on her way to assess patient

## 2020-01-02 NOTE — Progress Notes (Signed)
Results for Beverly Torres, Beverly Torres (MRN [id number]) as of 01/02/2020 09:10  Ref. Range 01/02/2020 00:35  Glucose Latest Ref Range: 70 - 99 mg/dL 106 (H)   Results for Beverly Torres, Beverly Torres (MRN [id number]) as of 01/02/2020 09:10  Ref. Range 01/02/2020 00:35  Hemoglobin A1C Latest Ref Range: 4.8 - 5.6 % 5.7 (H)    Admit for C-Section  History: GDM  Home DM Meds: NPH Insulin 20 units QHS  Current Orders: IV Insulin Drip    IV Insulin Drip started at 2am.  C-Section this AM  Will follow post-delivery    --Will follow patient during hospitalization--  Wyn Quaker RN, MSN, CDE Diabetes Coordinator Inpatient Glycemic Control Team Team Pager: [phone] (8a-5p)

## 2020-01-02 NOTE — Anesthesia Postprocedure Evaluation (Signed)
Anesthesia Post Note  Patient: Beverly Torres  Procedure(s) Performed: [place]     Patient location during evaluation: Mother Baby Anesthesia Type: Spinal Level of consciousness: oriented and awake and alert Pain management: pain level controlled Vital Signs Assessment: post-procedure vital signs reviewed and stable Respiratory status: spontaneous breathing and respiratory function stable Cardiovascular status: blood pressure returned to baseline and stable Postop Assessment: no headache, no backache, no apparent nausea or vomiting and able to ambulate Anesthetic complications: no   No complications documented.  Last Vitals:  Vitals:   01/02/20 1253 01/02/20 1300  BP: (!) 96/46 (!) 106/57  Pulse: (!) 55 62  Resp: 14 18  Temp:    SpO2: 98% 97%    Last Pain:  Vitals:   01/02/20 1300  TempSrc:   PainSc: 0-No pain                 Belenda Cruise P Sanyia Dini

## 2020-01-02 NOTE — Lactation Note (Signed)
This note was copied from a baby's chart. Lactation Consultation Note  Patient Name: Beverly Torres YQMGN'O Date: 01/02/2020 Reason for consult: Initial assessment;Early term 37-38.6wks P2, 6 hour ETI female infant, had few low blood sugars been give EBM after feeding at the breast.  Mom with hx: GDM, HPV, current C/S delivery and past hx of Schizophrenia and anxiety.  Infant had one stool since birth. Mom is using the DEBP and pumped 10 mls she had sitting on the counter. Per mom, after she BF infant she did give him some of her EBM by spoon. LC gave mom Breastfeeding supplemental sheet, based on infant's age and hours of life, mom understands she can continue to give infant back extra volume of breast milk after latching infant at the breast by spoon feeding infant her EBM. Mom is experienced at breastfeeding, she breastfed her almost 38 year old daughter for 2 months. Mom is active on the [name] in [place] but she moved back to [facility] she plans to transfer her [facility] here. Per mom, infant is latching well, she last BF infant one hour ago for 10 minutes prior to [facility] entering the room. Mom was in bed and MGM was holding sleeping infant swaddled in blankets, LC did not observe latch at this time. Per mom, she knows how to hand exress. RN explained how to use DEBP with mom. Mom understands that in her DEBP kit she has hand pump she can take home prn.  Mom knows to BF according to cues, 8 to 12+ times within 24 hours, STS. LC suggested mom attend [place] [facility] after Hospital Discharge within the local Community. Mom knows to call RN or LC if she has questions, concerns or need assistance with latching infant at the breast.  Mom made aware of O/P services, breastfeeding support groups, community resources, and our phone # for post-discharge questions.   Maternal Data Formula Feeding for Exclusion: Yes Reason for exclusion: Mother's choice to  formula and breast feed on admission Has patient been taught Hand Expression?: Yes Does the patient have breastfeeding experience prior to this delivery?: Yes  Feeding Feeding Type: Breast Milk  LATCH Score Latch: Repeated attempts needed to sustain latch, nipple held in mouth throughout feeding, stimulation needed to elicit sucking reflex.  Audible Swallowing: Spontaneous and intermittent  Type of Nipple: Everted at rest and after stimulation  Comfort (Breast/Nipple): Soft / non-tender  Hold (Positioning): Assistance needed to correctly position infant at breast and maintain latch.  LATCH Score: 8  Interventions Interventions: Breast feeding basics reviewed;Skin to skin;Hand express;Breast compression;DEBP  Lactation Tools Discussed/Used WIC Program: Yes Pump Review: Setup, frequency, and cleaning;Milk Storage Initiated by:: RN Date initiated:: 01/02/20   Consult Status Consult Status: Follow-up Date: 01/03/20 Follow-up type: In-patient    Vicente Serene 01/02/2020, 4:53 PM

## 2020-01-02 NOTE — Anesthesia Procedure Notes (Signed)
Spinal  Patient location during procedure: OR Start time: 01/02/2020 10:03 AM End time: 01/02/2020 10:04 AM Staffing Performed: anesthesiologist  Anesthesiologist: Darral Dash, DO Preanesthetic Checklist Completed: patient identified, IV checked, site marked, risks and benefits discussed, surgical consent, monitors and equipment checked, pre-op evaluation and timeout performed Spinal Block Patient position: sitting Prep: DuraPrep Patient monitoring: heart rate, cardiac monitor, continuous pulse ox and blood pressure Approach: midline Location: L3-4 Injection technique: single-shot Needle Needle type: Pencan  Needle gauge: 24 G Needle length: 10 cm Assessment Sensory level: T4 Additional Notes Patient identified. Risks/Benefits/Options discussed with patient including but not limited to bleeding, infection, nerve damage, paralysis, failed block, incomplete pain control, headache, blood pressure changes, nausea, vomiting, reactions to medications, itching and postpartum back pain. Confirmed with bedside nurse the patient's most recent platelet count. Confirmed with patient that they are not currently taking any anticoagulation, have any bleeding history or any family history of bleeding disorders. Patient expressed understanding and wished to proceed. All questions were answered. Sterile technique was used throughout the entire procedure. Please see nursing notes for vital signs. Warning signs of high block given to the patient including shortness of breath, tingling/numbness in hands, complete motor block, or any concerning symptoms with instructions to call for help. Patient was given instructions on fall risk and not to get out of bed. All questions and concerns addressed with instructions to call with any issues or inadequate analgesia.

## 2020-01-02 NOTE — Progress Notes (Signed)
816 EBL not included in EBL total on sidebar.  MD [place] notified. Metergine ordered. RN set up breast pump as well and continued pitocin drip

## 2020-01-03 LAB — CBC
HCT: 26.2 % — ABNORMAL LOW (ref 36.0–46.0)
Hemoglobin: 8.6 g/dL — ABNORMAL LOW (ref 12.0–15.0)
MCH: 29.1 pg (ref 26.0–34.0)
MCHC: 32.8 g/dL (ref 30.0–36.0)
MCV: 88.5 fL (ref 80.0–100.0)
Platelets: 165 10*3/uL (ref 150–400)
RBC: 2.96 MIL/uL — ABNORMAL LOW (ref 3.87–5.11)
RDW: 17.9 % — ABNORMAL HIGH (ref 11.5–15.5)
WBC: 11.4 10*3/uL — ABNORMAL HIGH (ref 4.0–10.5)
nRBC: 0 % (ref 0.0–0.2)

## 2020-01-03 LAB — TYPE AND SCREEN
ABO/RH(D): A POS
Antibody Screen: NEGATIVE
Unit division: 0
Unit division: 0

## 2020-01-03 LAB — BPAM RBC
Blood Product Expiration Date: 202112032359
Blood Product Expiration Date: 202112162359
ISSUE DATE / TIME: 202111241715
ISSUE DATE / TIME: 202111242000
Unit Type and Rh: 6200
Unit Type and Rh: 6200

## 2020-01-03 LAB — GLUCOSE, CAPILLARY
Glucose-Capillary: 81 mg/dL (ref 70–99)
Glucose-Capillary: 89 mg/dL (ref 70–99)
Glucose-Capillary: 124 mg/dL — ABNORMAL HIGH (ref 70–99)
Glucose-Capillary: 130 mg/dL — ABNORMAL HIGH (ref 70–99)

## 2020-01-03 MED ORDER — IBUPROFEN 800 MG PO TABS
800.0000 mg | ORAL_TABLET | Freq: Four times a day (QID) | ORAL | Status: DC
  Administered 2020-01-03 – 2020-01-04 (×6): 800 mg via ORAL
  Filled 2020-01-03 (×6): qty 1

## 2020-01-03 NOTE — Progress Notes (Addendum)
Patient is eating, ambulating, voiding.  Pain control is good.  No complaints.  Vitals:   01/02/20 2030 01/02/20 2205 01/03/20 0500 01/03/20 0902  BP: (!) 103/48 (!) 103/42 (!) 104/47 111/83  Pulse: (!) 54 (!) 59 (!) 54 76  Resp: 18 18 18 18   Temp: 98.4 F (36.9 C) 98.2 F (36.8 C) 98 F (36.7 C) 97.8 F (36.6 C)  TempSrc: Oral Oral Oral Oral  SpO2: 94% 97% 95% 100%  Weight:      Height:        Fundus firm Inc: c/d/i Ext: no calf tenderness  Lab Results  Component Value Date   WBC 11.4 (H) 01/03/2020   HGB 8.6 (L) 01/03/2020   HCT 26.2 (L) 01/03/2020   MCV 88.5 01/03/2020   PLT 165 01/03/2020    --/--/A POS (11/24 5945)  A/P Post op day #1 s/p C/S - for NRFHT and heavy vaginal bleeding in setting of non-complaint poorly controlled GDMA2 Symptomatic anemia from heavy vaginal bleeding- s/p 2U PRBCs overnight.  Hb currently 8.6 down form 9.0 checked in the midst of bleeding so likely not reflective of true state.  Pt feeling well currently. Baby in NIcU Bleeding appropriate now, but will hold Lovenox given recent bleeding and transfusion.  Advised pt to wear SCDs at all times to reduce DVT risk. SW consult placed by delivering physician Discouraged smoking d/t increased risk of postoperative complications such as wound infection, offered nicotine patch/gum.  Pt states she quit on entry to hospital and feels and declines medication management.  Routine care.     Beverly Torres

## 2020-01-03 NOTE — Anesthesia Postprocedure Evaluation (Signed)
Anesthesia Post Note  Patient: Beverly Torres  Procedure(s) Performed: [place]     Patient location during evaluation: Mother Baby Anesthesia Type: Spinal Level of consciousness: awake and alert Pain management: pain level controlled Vital Signs Assessment: post-procedure vital signs reviewed and stable Respiratory status: spontaneous breathing Cardiovascular status: stable Postop Assessment: no headache, adequate PO intake, no backache, patient able to bend at knees, able to ambulate and no apparent nausea or vomiting Anesthetic complications: no   No complications documented.  Last Vitals:  Vitals:   01/02/20 2205 01/03/20 0500  BP: (!) 103/42 (!) 104/47  Pulse: (!) 59 (!) 54  Resp: 18 18  Temp: 36.8 C 36.7 C  SpO2: 97% 95%    Last Pain:  Vitals:   01/03/20 0630  TempSrc:   PainSc: 0-No pain   Pain Goal:                   [name]

## 2020-01-03 NOTE — Lactation Note (Signed)
This note was copied from a baby's chart. Lactation Consultation Note  Patient Name: Beverly Torres [id number] Date: 01/03/2020 Reason for consult: Follow-up assessment  LC to room for f/u visit. Baby is no longer in NICU. Baby on father's chest during visit. Baby was awake and cuing. Mom bottle feeding today and declined bf help. Patient was provided with the opportunity to ask questions. All concerns were addressed.  Will plan follow up visit.   Feeding Feeding Type: Formula Nipple Type: Slow - flow   Interventions Interventions: Breast feeding basics reviewed;Expressed milk;Hand express;DEBP;Hand pump    Consult Status Consult Status: Follow-up Date: 01/04/20 Follow-up type: In-patient    Gwynne Edinger 01/03/2020, 2:34 PM

## 2020-01-03 NOTE — Progress Notes (Signed)
CSW received consult for martial stress, poor diabetes management, and hx of anxiety and depression.  CSW met with MOB in room 401 to offer support and complete assessment.    Prior to meeting with MOB, CSW was informed that there was potential for infant to transfer from the NICU back to the MBU to afternoon.   When CSW arrived, MOB was resting in bed. CSW explained CSW's role and MOB without prompting communicated, "I remember meeting with you when I had my daughter Caroline; CSW agreed.   CSW assessed for martial stressors and MOB acknowledged a "Situation" between she and FOB/Husband that has resolved. MOB was vague about the "Situation," and reported, "We are good now."  CSW assessed for other psychosocial stressors and MOB denied all stressors.   CSW asked about MOB's poor diabetes management and MOB reported. "It was really hard for me to give myself shots. It was like the hardest thing that I had to do."  MOB also shared that her diabetes was gestational and since giving birth she has had "Great" glucose readings.   CSW asked about MOB's MH hx and before MOB could begin to assess MOB communicated, "I don't have schizophrenia that was a miss dx and I have asked them to take it out of my chart."  CSW acknowledged that she remember MOB communicated that to CSW in  2019. MOB continues to deny any signs or symptoms of schizophrenia. However, MOB acknowledged her anxiety and depression. Per MOB, MOB's anxiety/depression symtoms have been "Very minium and I know how to control them." MOB shared that she stopped therapy due to the all the progress she had made. CSW provided education regarding the baby blues period vs. perinatal mood disorders, discussed treatment and gave resources for mental health follow up if concerns arise.  CSW recommends self-evaluation during the postpartum time period using the New Mom Checklist from Postpartum Progress and encouraged MOB to contact a medical professional if  symptoms are noted at any time.  CSW assessed for safety and MOB denied SI, HI, and DV. MOB reported having a good support team and feeling comfortable seeking help if help is needed.   CSW identifies no further need for intervention and no barriers to discharge at this time.  Beverly Torres, MSW, LCSW Clinical Social Work [phone] 

## 2020-01-04 LAB — GLUCOSE, CAPILLARY
Glucose-Capillary: 114 mg/dL — ABNORMAL HIGH (ref 70–99)
Glucose-Capillary: 94 mg/dL (ref 70–99)

## 2020-01-04 MED ORDER — IBUPROFEN 800 MG PO TABS
800.0000 mg | ORAL_TABLET | Freq: Four times a day (QID) | ORAL | 0 refills | Status: DC
Start: 2020-01-04 — End: 2023-08-29

## 2020-01-04 MED ORDER — OXYCODONE HCL 5 MG PO TABS: 5.0000 mg | ORAL_TABLET | Freq: Four times a day (QID) | ORAL | 0 refills | Status: DC | PRN

## 2020-01-04 NOTE — Discharge Summary (Signed)
Postpartum Discharge Summary    Patient Name: Beverly Torres DOB: 24-Jan-1996 MRN: [id number]  Date of admission: 01/02/2020 Delivery date:01/02/2020  Delivering provider: Irene Pap E  Date of discharge: 01/04/2020  Admitting diagnosis: Gestational diabetes mellitus [O24.419] Intrauterine pregnancy: [redacted]w[redacted]d     Secondary diagnosis:  Active Problems:   Gestational diabetes mellitus     Discharge diagnosis: Term Pregnancy Delivered and GDM A2                                              Post partum procedures:blood transfusion Augmentation: Cytotec Complications: Placental Abruption and Hemorrhage>1077mL  Hospital course: Induction of Labor With Cesarean Section   24 y.o. yo [id number] at [redacted]w[redacted]d was admitted to the hospital 01/02/2020 for induction of labor. Patient had a labor course significant for heavy vaginal bleeding and suspected placenta abruption. The patient went for cesarean section due to Non-Reassuring FHR and abruption. Delivery details are as follows: Membrane Rupture Time/Date: 10:24 AM ,01/02/2020   Delivery Method:C-Section, Low Transverse  Details of operation can be found in separate operative Note.  Total EBL before, during and after delivery was 2.9L.  She received 2 U PRBCs immediately following delivery as well as a methergine course  Patient then had an uncomplicated postpartum course. She is ambulating, tolerating a regular diet, passing flatus, and urinating well. Pain is well controlled.  She is not feeling dizzy with ambulation.  Patient is discharged home in stable condition on 01/04/20.      Newborn Data: Birth date:01/02/2020  Birth time:10:25 AM  Gender:Female  Living status:Living  Apgars:8 ,8  Weight:4010 g                                  Transfusion:Yes  Physical exam  Vitals:   01/03/20 0902 01/03/20 1420 01/03/20 2044 01/04/20 0420  BP: 111/83 (!) 120/56 (!) 122/49 (!) 134/55  Pulse: 76 85 74 97  Resp: 18 17 20 20   Temp: 97.8  F (36.6 C) 98.4 F (36.9 C) 98 F (36.7 C) 97.7 F (36.5 C)  TempSrc: Oral Oral Oral Oral  SpO2: 100% 99% 100% 100%  Weight:      Height:       General: alert, cooperative and no distress Lochia: appropriate Uterine Fundus: firm Incision: Healing well with no significant drainage, No significant erythema, Dressing is clean, dry, and intact DVT Evaluation: No evidence of DVT seen on physical exam. Labs: Lab Results  Component Value Date   WBC 11.4 (H) 01/03/2020   HGB 8.6 (L) 01/03/2020   HCT 26.2 (L) 01/03/2020   MCV 88.5 01/03/2020   PLT 165 01/03/2020   CMP Latest Ref Rng & Units 01/02/2020  Glucose 70 - 99 mg/dL 106(H)  BUN 6 - 20 mg/dL 10  Creatinine 0.44 - 1.00 mg/dL 0.80  Sodium 135 - 145 mmol/L 136  Potassium 3.5 - 5.1 mmol/L 4.1  Chloride 98 - 111 mmol/L 105  CO2 22 - 32 mmol/L 21(L)  Calcium 8.9 - 10.3 mg/dL 9.2  Total Protein 6.5 - 8.1 g/dL 5.7(L)  Total Bilirubin 0.3 - 1.2 mg/dL 0.4  Alkaline Phos 38 - 126 U/L 138(H)  AST 15 - 41 U/L 15  ALT 0 - 44 U/L 33   Edinburgh Score: Edinburgh Postnatal Depression Scale Screening Tool  01/02/2020  I have been able to laugh and see the funny side of things. 0  I have looked forward with enjoyment to things. 1  I have blamed myself unnecessarily when things went wrong. 2  I have been anxious or worried for no good reason. 1  I have felt scared or panicky for no good reason. 0  Things have been getting on top of me. 1  I have been so unhappy that I have had difficulty sleeping. 0  I have felt sad or miserable. 0  I have been so unhappy that I have been crying. 0  The thought of harming myself has occurred to me. 0  Edinburgh Postnatal Depression Scale Total 5      After visit meds:  Allergies as of 01/04/2020      Reactions   Meat [alpha-gal]    Per allergist, Red meat per patient   Strawberry (diagnostic) Itching      Medication List    STOP taking these medications   insulin NPH Human 100 UNIT/ML  injection Commonly known as: NOVOLIN N     TAKE these medications   ibuprofen 800 MG tablet Commonly known as: ADVIL Take 1 tablet (800 mg total) by mouth every 6 (six) hours.   multivitamin-prenatal 27-0.8 MG Tabs tablet Take 1 tablet by mouth daily at 12 noon.   oxyCODONE 5 MG immediate release tablet Commonly known as: Oxy IR/ROXICODONE Take 1 tablet (5 mg total) by mouth every 6 (six) hours as needed for severe pain.            Discharge Care Instructions  (From admission, onward)         Start     Ordered   01/04/20 0000  No dressing needed        01/04/20 0950           Discharge home in stable condition  Infant Disposition:home with mother Discharge instruction: per After Visit Summary and Postpartum booklet. Activity: Advance as tolerated. Pelvic rest for 6 weeks.  Diet: routine diet Postpartum Appointment:4 weeks Additional Postpartum F/U: 2 hour GTT Future Appointments:No future appointments. Follow up Visit:  Follow-up Information    Rowland Lathe, MD Follow up in 4 week(s).   Specialty: Obstetrics and Gynecology Contact information: [address] [place] [address] [phone]                   01/04/2020 [facility] GEFFEL Carlis Abbott, MD

## 2020-01-04 NOTE — Progress Notes (Signed)
Patient is eating, ambulating, voiding.  Pain control is good.  No complaints.  Vitals:   01/03/20 0902 01/03/20 1420 01/03/20 2044 01/04/20 0420  BP: 111/83 (!) 120/56 (!) 122/49 (!) 134/55  Pulse: 76 85 74 97  Resp: 18 17 20 20   Temp: 97.8 F (36.6 C) 98.4 F (36.9 C) 98 F (36.7 C) 97.7 F (36.5 C)  TempSrc: Oral Oral Oral Oral  SpO2: 100% 99% 100% 100%  Weight:      Height:        Fundus firm Inc: c/d/i Ext: no calf tenderness  Lab Results  Component Value Date   WBC 11.4 (H) 01/03/2020   HGB 8.6 (L) 01/03/2020   HCT 26.2 (L) 01/03/2020   MCV 88.5 01/03/2020   PLT 165 01/03/2020    --/--/A POS (11/24 0035)  A/P Post op day #2 s/p C/S - for NRFHT and suspected placenta abruption in setting of non-complaint poorly controlled GDMA2 ABLA-- s/p 2U PRBCs.  Hb currently 8.6 down form 9.0 checked in the midst of bleeding so likely not reflective of true state.  Pt feeling well currently and ambulating without difficulty.  Will d/c on PO iron. GDMA1--poor control antepartum, at goal now.  Plan 2hr gtt 6 weeks PP.  Meeting all goals.  Discharge to home today.     [place]

## 2020-01-04 NOTE — Lactation Note (Signed)
This note was copied from a baby's chart. Lactation Consultation Note  Patient Name: Beverly Torres VELFY'B Date: 01/04/2020  [name] Beverly Torres now 20 hours old.  Parents hoping to be d/c today. Mom reports she plans to mostly formula feed because she has a two year old at home.   Discussed pumping.  Mom reports she does not have a DEBP.  Mom repots she was given a hand pump.  Mom reports she did use a handpump with her daughter. Mom reprots pumping did help her have more milk.   Mom has not used DEBP in her room since yesterday.  Urged mom to continue to provide what breastmilk she could.  Explained to mom that any breastmilk was better than no breastmilk. Discussed reaching out to [facility] for DEBP. Reviewed formula prep guideline handout. Gave handout to mom.  Urged her to call lactation as needed.    Maternal Data    Feeding    LATCH Score                   Interventions    Lactation Tools Discussed/Used     Consult Status      Beverly Torres 01/04/2020, 8:38 AM

## 2020-01-06 ENCOUNTER — Other Ambulatory Visit: Payer: Self-pay

## 2020-01-06 ENCOUNTER — Inpatient Hospital Stay (HOSPITAL_COMMUNITY)
Admission: AD | Admit: 2020-01-06 | Discharge: 2020-01-06 | Disposition: A | Discharge status: Home / Self Care | Payer: Medicaid Other | Attending: Obstetrics | Admitting: Obstetrics

## 2020-01-06 DIAGNOSIS — R509 Fever, unspecified: Secondary | ICD-10-CM | POA: Insufficient documentation

## 2020-01-06 DIAGNOSIS — F17210 Nicotine dependence, cigarettes, uncomplicated: Secondary | ICD-10-CM | POA: Insufficient documentation

## 2020-01-06 DIAGNOSIS — O99335 Smoking (tobacco) complicating the puerperium: Secondary | ICD-10-CM | POA: Diagnosis not present

## 2020-01-06 DIAGNOSIS — O9081 Anemia of the puerperium: Secondary | ICD-10-CM | POA: Diagnosis not present

## 2020-01-06 DIAGNOSIS — O9903 Anemia complicating the puerperium: Secondary | ICD-10-CM

## 2020-01-06 DIAGNOSIS — D508 Other iron deficiency anemias: Secondary | ICD-10-CM

## 2020-01-06 DIAGNOSIS — O9089 Other complications of the puerperium, not elsewhere classified: Secondary | ICD-10-CM | POA: Insufficient documentation

## 2020-01-06 LAB — CBC
HCT: 27.2 % — ABNORMAL LOW (ref 36.0–46.0)
Hemoglobin: 8.4 g/dL — ABNORMAL LOW (ref 12.0–15.0)
MCH: 28.4 pg (ref 26.0–34.0)
MCHC: 30.9 g/dL (ref 30.0–36.0)
MCV: 91.9 fL (ref 80.0–100.0)
Platelets: 341 10*3/uL (ref 150–400)
RBC: 2.96 MIL/uL — ABNORMAL LOW (ref 3.87–5.11)
RDW: 17.2 % — ABNORMAL HIGH (ref 11.5–15.5)
WBC: 11 10*3/uL — ABNORMAL HIGH (ref 4.0–10.5)
nRBC: 0 % (ref 0.0–0.2)

## 2020-01-06 MED ORDER — FERROUS GLUCONATE 324 (38 FE) MG PO TABS: 324.0000 mg | ORAL_TABLET | Freq: Every day | ORAL | 0 refills | Status: DC

## 2020-01-06 NOTE — MAU Provider Note (Signed)
History     CSN: [id number]  Arrival date and time: 01/06/20 2144   First Provider Initiated Contact with Patient 01/06/20 2241      Chief Complaint  Patient presents with  . Fever   HPI  Patient Beverly Torres is a [id number]  at 4 days PP after [place] for placental abruption. She is here because she had a temp  At home and feels weak. She denies HA, blurry vision, SOB, muscle aches. She feels some pain at her incision site. She wants to know if her incision is ok.    Patient feels weak. She went to [employer] today and walked around; she felt weak when she came home. She denies abdominal tenderness but endorses muscle pain in her sides and she feels sore around her incision.  She reports that her mom told her that she looked pale . She also said that she had a fever at home 101.5 at home as measured orally.  OB History    Gravida  3   Para  2   Term  2   Preterm      AB  1   Living  2     SAB  1   TAB      Ectopic      Multiple  0   Live Births  2           Past Medical History:  Diagnosis Date  . Acid reflux   . Anxiety   . Attention deficit disorder   . Chronic chest pain   . Depression   . Diabetes mellitus without complication ([place])   . Endometriosis   . Fibroid   . Gestational diabetes   . Morbid obesity with BMI of 40.0-44.9, adult ([place])   . PCOS (polycystic ovarian syndrome)   . Vaginal Pap smear, abnormal     Past Surgical History:  Procedure Laterality Date  . ADENOIDECTOMY    . CESAREAN SECTION  01/02/2020   Procedure: CESAREAN SECTION;  Surgeon: Rowland Lathe, MD;  Location: [facility] LD ORS;  Service: Obstetrics;;  . TONSILLECTOMY    . WISDOM TOOTH EXTRACTION      Family History  Problem Relation Age of Onset  . Anxiety disorder Mother   . Drug abuse Father   . ADD / ADHD Father   . ADD / ADHD Sister   . Anxiety disorder Sister   . ADD / ADHD Brother   . ODD Brother   . Rheum arthritis Maternal Grandmother   . Kidney  failure Maternal Grandmother   . Hypertension Maternal Grandfather   . Diabetes Maternal Grandfather   . Arthritis Maternal Grandfather   . Cancer Maternal Grandfather        Melanoma  . Diabetes Paternal Grandfather     Social History   Tobacco Use  . Smoking status: Heavy Tobacco Smoker    Packs/day: 1.00    Years: 8.00    Pack years: 8.00    Types: Cigarettes  . Smokeless tobacco: Never Used  Vaping Use  . Vaping Use: Former  Substance Use Topics  . Alcohol use: No  . Drug use: No    Allergies:  Allergies  Allergen Reactions  . 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 Strawberry (Diagnostic) Itching    No medications prior to admission.    Review of Systems  Constitutional: Negative.   HENT: Negative.   Respiratory: Negative.  Negative for shortness of  breath.   Cardiovascular: Negative.   Gastrointestinal: Negative.   Genitourinary: Negative.   Musculoskeletal: Negative.   Neurological: Positive for weakness.   Physical Exam   Blood pressure (!) 113/45, pulse (!) 117, temperature 99.3 F (37.4 C), resp. rate 18, SpO2 99 %, unknown if currently breastfeeding.  Physical Exam Constitutional:      Appearance: Normal appearance.  Cardiovascular:     Rate and Rhythm: Normal rate.  Pulmonary:     Effort: Pulmonary effort is normal.  Abdominal:     General: There is no distension.     Palpations: Abdomen is soft.     Tenderness: There is abdominal tenderness.     Comments: Tenderness around incision,   Musculoskeletal:        General: Normal range of motion.  Neurological:     Mental Status: She is alert.   Incision is dry, no signs of infection. No odor, no pus, no redness.  Breasts are soft, beginning to fill with milk. No redness or signs of mastitis but patient reports that they feel heavy.  MAU Course  Procedures  MDM -Temp is normal here, no fever and COVID test not performed -CBC is 11, was 11.4 inpatient.  -Hemoglobin  is 8.4, drop from 11.9 on admission.  -Patient had snack and feels better in MAU.  Patient Vitals for the past 24 hrs:  BP Temp Pulse Resp SpO2  01/06/20 2320 (!) 113/45 99.3 F (37.4 C) -- 18 --  01/06/20 2157 137/63 99.5 F (37.5 C) (!) 117 18 99 %     Assessment and Plan   1. Other iron deficiency anemia    2. Discussed resting and staying home to recuperate, let others help. Do not go walking around [employer] at this time. Take pain meds as prescribed and start taking iron pills.  -Reviewed signs of infection, importance of pumping and empyting breasts every 2-3 hours -Reviewed how to care for incision, signs of infection, bathing and patting dry -Patient to call office today to schedule incision check; reports she was supposed to have one but she does not have one scheduled yet.  -Rx for iron pills sent.   Mervyn Skeeters Tyquon Near 01/07/2020, 6:29 AM

## 2020-01-06 NOTE — Discharge Instructions (Signed)

## 2020-01-06 NOTE — MAU Note (Signed)
PP 11/24 c-section.  Has been feeling weak all day. Took temp 101.4.  No increased pain or discomfort. No foul smelling discharge and no drainage from c-section incision reported.

## 2020-01-07 LAB — URINALYSIS, ROUTINE W REFLEX MICROSCOPIC
Bilirubin Urine: NEGATIVE
Glucose, UA: NEGATIVE mg/dL
Ketones, ur: NEGATIVE mg/dL
Nitrite: NEGATIVE
Protein, ur: 100 mg/dL — AB
RBC / HPF: >50 RBC/hpf — ABNORMAL HIGH (ref 0–5)
Specific Gravity, Urine: 1.028 (ref 1.005–1.030)
WBC, UA: >50 WBC/hpf — ABNORMAL HIGH (ref 0–5)
pH: 6 (ref 5.0–8.0)

## 2020-01-07 LAB — SURGICAL PATHOLOGY

## 2020-01-08 ENCOUNTER — Encounter (HOSPITAL_COMMUNITY): Payer: Self-pay | Admitting: Obstetrics and Gynecology

## 2022-06-14 ENCOUNTER — Encounter: Payer: Self-pay | Admitting: Podiatry

## 2022-06-14 ENCOUNTER — Ambulatory Visit (INDEPENDENT_AMBULATORY_CARE_PROVIDER_SITE_OTHER): Payer: Medicaid Other | Admitting: Podiatry

## 2022-06-14 ENCOUNTER — Ambulatory Visit (INDEPENDENT_AMBULATORY_CARE_PROVIDER_SITE_OTHER): Payer: Medicaid Other

## 2022-06-14 DIAGNOSIS — M722 Plantar fascial fibromatosis: Secondary | ICD-10-CM

## 2022-06-14 DIAGNOSIS — R52 Pain, unspecified: Secondary | ICD-10-CM

## 2022-06-14 DIAGNOSIS — L600 Ingrowing nail: Secondary | ICD-10-CM

## 2022-06-14 MED ORDER — TRIAMCINOLONE ACETONIDE 10 MG/ML IJ SUSP
10.0000 mg | Freq: Once | INTRAMUSCULAR | Status: AC
Start: 2022-06-14 — End: 2022-06-14
  Administered 2022-06-14: 10 mg

## 2022-06-16 NOTE — Progress Notes (Signed)
Subjective:   Patient ID: Beverly Torres, female   DOB: 27 y.o.   MRN: [id number]   HPI Patient states has developed a lot of pain in the bottom of her right heel over the last week and does smoke a pack a day tries to be active   Review of Systems  All other systems reviewed and are negative.       Objective:  Physical Exam Vitals and nursing note reviewed.  Constitutional:      Appearance: She is well-developed.  Pulmonary:     Effort: Pulmonary effort is normal.  Musculoskeletal:        General: Normal range of motion.  Skin:    General: Skin is warm.  Neurological:     Mental Status: She is alert.     Neurovascular status intact muscle strength found to be adequate range of motion adequate with exquisite discomfort medial fascial band right insertional point tendon calcaneus inflammation fluid around the medial band at its insertion     Assessment:  Acute Planter fasciitis right inflammation fluid in the medial band     Plan:  H&P reviewed condition went ahead today did sterile prep injected the plantar fascia right 3 mg Kenalog 5 mg Xylocaine advised on supportive shoes and reappoint in several weeks may require more aggressive treatment plan  X-rays indicate small spur no indication stress fracture arthritis associated with condition

## 2023-05-09 ENCOUNTER — Encounter (HOSPITAL_BASED_OUTPATIENT_CLINIC_OR_DEPARTMENT_OTHER): Payer: Self-pay | Admitting: Emergency Medicine

## 2023-05-09 ENCOUNTER — Emergency Department (HOSPITAL_BASED_OUTPATIENT_CLINIC_OR_DEPARTMENT_OTHER): Payer: MEDICAID | Admitting: Radiology

## 2023-05-09 ENCOUNTER — Emergency Department (HOSPITAL_BASED_OUTPATIENT_CLINIC_OR_DEPARTMENT_OTHER)
Admission: EM | Admit: 2023-05-09 | Discharge: 2023-05-09 | Disposition: A | Discharge status: Home / Self Care | Payer: MEDICAID | Attending: Emergency Medicine | Admitting: Emergency Medicine

## 2023-05-09 ENCOUNTER — Other Ambulatory Visit (HOSPITAL_BASED_OUTPATIENT_CLINIC_OR_DEPARTMENT_OTHER): Payer: Self-pay

## 2023-05-09 ENCOUNTER — Other Ambulatory Visit: Payer: Self-pay

## 2023-05-09 DIAGNOSIS — E119 Type 2 diabetes mellitus without complications: Secondary | ICD-10-CM | POA: Insufficient documentation

## 2023-05-09 DIAGNOSIS — R072 Precordial pain: Secondary | ICD-10-CM | POA: Insufficient documentation

## 2023-05-09 DIAGNOSIS — R0789 Other chest pain: Secondary | ICD-10-CM | POA: Diagnosis present

## 2023-05-09 LAB — COMPREHENSIVE METABOLIC PANEL WITH GFR
ALT: 15 U/L (ref 0–44)
AST: 11 U/L — ABNORMAL LOW (ref 15–41)
Albumin: 4 g/dL (ref 3.5–5.0)
Alkaline Phosphatase: 45 U/L (ref 38–126)
Anion gap: 5 (ref 5–15)
BUN: 12 mg/dL (ref 6–20)
CO2: 29 mmol/L (ref 22–32)
Calcium: 8.9 mg/dL (ref 8.9–10.3)
Chloride: 106 mmol/L (ref 98–111)
Creatinine, Ser: 0.7 mg/dL (ref 0.44–1.00)
GFR, Estimated: >60 mL/min (ref >60)
Glucose, Bld: 93 mg/dL (ref 70–99)
Potassium: 4 mmol/L (ref 3.5–5.1)
Sodium: 140 mmol/L (ref 135–145)
Total Bilirubin: 0.3 mg/dL (ref 0.0–1.2)
Total Protein: 6.4 g/dL — ABNORMAL LOW (ref 6.5–8.1)

## 2023-05-09 LAB — CBC WITH DIFFERENTIAL/PLATELET
Abs Immature Granulocytes: 0.01 10*3/uL (ref 0.00–0.07)
Basophils Absolute: 0 10*3/uL (ref 0.0–0.1)
Basophils Relative: 0 %
Eosinophils Absolute: 0.2 10*3/uL (ref 0.0–0.5)
Eosinophils Relative: 2 %
HCT: 39 % (ref 36.0–46.0)
Hemoglobin: 13 g/dL (ref 12.0–15.0)
Immature Granulocytes: 0 %
Lymphocytes Relative: 30 %
Lymphs Abs: 2 10*3/uL (ref 0.7–4.0)
MCH: 30.6 pg (ref 26.0–34.0)
MCHC: 33.3 g/dL (ref 30.0–36.0)
MCV: 91.8 fL (ref 80.0–100.0)
Monocytes Absolute: 0.5 10*3/uL (ref 0.1–1.0)
Monocytes Relative: 7 %
Neutro Abs: 3.9 10*3/uL (ref 1.7–7.7)
Neutrophils Relative %: 61 %
Platelets: 259 10*3/uL (ref 150–400)
RBC: 4.25 MIL/uL (ref 3.87–5.11)
RDW: 12.6 % (ref 11.5–15.5)
WBC: 6.6 10*3/uL (ref 4.0–10.5)
nRBC: 0 % (ref 0.0–0.2)

## 2023-05-09 LAB — HCG, SERUM, QUALITATIVE: Preg, Serum: NEGATIVE

## 2023-05-09 LAB — LIPASE, BLOOD: Lipase: 21 U/L (ref 11–51)

## 2023-05-09 LAB — TROPONIN I (HIGH SENSITIVITY): Troponin I (High Sensitivity): 7 ng/L (ref <18)

## 2023-05-09 MED ORDER — ALUM & MAG HYDROXIDE-SIMETH 200-200-20 MG/5ML PO SUSP
30.0000 mL | Freq: Once | ORAL | Status: AC
  Administered 2023-05-09: 30 mL via ORAL
  Filled 2023-05-09: qty 30

## 2023-05-09 MED ORDER — ONDANSETRON 4 MG PO TBDP
4.0000 mg | ORAL_TABLET | Freq: Once | ORAL | Status: AC
Start: 2023-05-09 — End: 2023-05-09
  Administered 2023-05-09: 4 mg via ORAL
  Filled 2023-05-09: qty 1

## 2023-05-09 MED ORDER — PANTOPRAZOLE SODIUM 40 MG PO TBEC
40.0000 mg | DELAYED_RELEASE_TABLET | Freq: Every day | ORAL | 0 refills | Status: DC
  Filled 2023-05-09: qty 90, 90d supply, fill #0

## 2023-05-09 NOTE — ED Provider Notes (Signed)
 Emergency Department Provider Note   I have reviewed the triage vital signs and the nursing notes.   HISTORY  Chief Complaint Chest Pain   HPI Beverly Torres is a 28 y.o. female with past history reviewed below presents to the emergency department for evaluation of central chest discomfort with fatigue and lightheadedness.  Symptoms began upon waking this morning.  She went about her normal routine but noticed some central heaviness radiating into the top part of her abdomen.  She has some mild nausea.  No diaphoresis.  No shortness of breath.  No pleuritic pain.  Notes that she feels "off center" like she is "wearing a heavy winter coat."    Past Medical History:  Diagnosis Date   Acid reflux    Anxiety    Attention deficit disorder    Chronic chest pain    Depression    Diabetes mellitus without complication (HCC)    Endometriosis    Fibroid    Gestational diabetes    Morbid obesity with BMI of 40.0-44.9, adult (HCC)    PCOS (polycystic ovarian syndrome)    Vaginal Pap smear, abnormal     Review of Systems  Constitutional: No fever/chills Cardiovascular: Positive chest pain. Respiratory: Denies shortness of breath. Gastrointestinal: Positive epigastric abdominal pain.  No nausea, no vomiting.  No diarrhea.  No constipation. Genitourinary: Negative for dysuria. Musculoskeletal: Negative for back pain. Skin: Negative for rash. Neurological: Negative for headaches.  ____________________________________________   PHYSICAL EXAM:  VITAL SIGNS: ED Triage Vitals  Encounter Vitals Group     BP 05/09/23 0753 (!) 104/52     Pulse Rate 05/09/23 0753 67     Resp 05/09/23 0753 16     Temp 05/09/23 0753 98.1 F (36.7 C)     Temp src --      SpO2 05/09/23 0753 100 %     Weight 05/09/23 0748 185 lb (83.9 kg)   Constitutional: Alert and oriented. Well appearing and in no acute distress. Eyes: Conjunctivae are normal.  Head: Atraumatic. Nose: No  congestion/rhinnorhea. Mouth/Throat: Mucous membranes are moist. Neck: No stridor.  Cardiovascular: Normal rate, regular rhythm. Good peripheral circulation. Grossly normal heart sounds.   Respiratory: Normal respiratory effort.  No retractions. Lungs CTAB. Gastrointestinal: Soft with mild mid-epigastric tenderness. No RUQ tenderness. No distention.  Musculoskeletal: No gross deformities of extremities. Neurologic:  Normal speech and language.  Skin:  Skin is warm, dry and intact. No rash noted.   ____________________________________________   LABS (all labs ordered are listed, but only abnormal results are displayed)  Labs Reviewed  COMPREHENSIVE METABOLIC PANEL WITH GFR - Abnormal; Notable for the following components:      Result Value   Total Protein 6.4 (*)    AST 11 (*)    All other components within normal limits  LIPASE, BLOOD  CBC WITH DIFFERENTIAL/PLATELET  HCG, SERUM, QUALITATIVE  TROPONIN I (HIGH SENSITIVITY)   ____________________________________________  EKG   EKG Interpretation Date/Time:  Monday May 09 2023 07:51:07 EDT Ventricular Rate:  66 PR Interval:  114 QRS Duration:  90 QT Interval:  398 QTC Calculation: 417 R Axis:   102  Text Interpretation: Normal sinus rhythm Rightward axis  Similar to 2021 tracing Confirmed by Alona Bene [phone]) on 05/09/2023 8:01:54 AM       ____________________________________________   PROCEDURES  Procedure(s) performed:   Procedures  None  ____________________________________________   INITIAL IMPRESSION / ASSESSMENT AND PLAN / ED COURSE  Pertinent labs & imaging results that  were available during my care of the patient were reviewed by me and considered in my medical decision making (see chart for details).   This patient is Presenting for Evaluation of chest pain, which does require a range of treatment options, and is a complaint that involves a high risk of morbidity and mortality.  The  Differential Diagnoses includes but is not exclusive to acute coronary syndrome, aortic dissection, pulmonary embolism, cardiac tamponade, community-acquired pneumonia, pericarditis, musculoskeletal chest wall pain, etc.  Critical Interventions-    Medications  alum & mag hydroxide-simeth (MAALOX/MYLANTA) 200-200-20 MG/5ML suspension 30 mL (30 mLs Oral Given 05/09/23 0826)  ondansetron (ZOFRAN-ODT) disintegrating tablet 4 mg (4 mg Oral Given 05/09/23 0827)    Reassessment after intervention: pain improved.   Clinical Laboratory Tests Ordered, included CBC without leukocytosis or anemia.  Troponin normal.  No acute kidney injury.  Pregnancy negative.  Radiologic Tests Ordered, included CXR. I independently interpreted the images and agree with radiology interpretation.   Cardiac Monitor Tracing which shows NSR.    Social Determinants of Health Risk patient is a smoker.   Medical Decision Making: Summary:  Patient presents to the emergency department for evaluation of chest discomfort and feeling off-center.  No focal deficits on exam.  Plan for screening blood work, EKG which is reassuring and chest x-ray.  Seems more GI in nature with mild midepigastric pain/tenderness on exam.   Reevaluation with update and discussion with patient. Symptoms improved. ACS workup reassuring. Stable for discharge with PCP follow up.  Considered admission but CP workup in the ED is reassuring.   Patient's presentation is most consistent with acute presentation with potential threat to life or bodily function.   Disposition: discharge  ____________________________________________  FINAL CLINICAL IMPRESSION(S) / ED DIAGNOSES  Final diagnoses:  Precordial chest pain     NEW OUTPATIENT MEDICATIONS STARTED DURING THIS VISIT:  Discharge Medication List as of 05/09/2023 10:20 AM     START taking these medications   Details  pantoprazole (PROTONIX) 40 MG tablet Take 1 tablet (40 mg total) by mouth  daily., Starting Mon 05/09/2023, Normal        Note:  This document was prepared using Dragon voice recognition software and may include unintentional dictation errors.  Alona Bene, MD, [facility]    Beverly Torres, Beverly Repress, MD 05/12/23 [phone]

## 2023-05-09 NOTE — Discharge Instructions (Signed)

## 2023-05-09 NOTE — ED Triage Notes (Signed)
 Pt c/o lightheaded upon waking today. Also endorses central, non-radiating CP, mild shob. Denies fever. Aox4, denies unilateral weakness, bilaterally equal, speech clear

## 2023-05-19 ENCOUNTER — Other Ambulatory Visit (HOSPITAL_BASED_OUTPATIENT_CLINIC_OR_DEPARTMENT_OTHER): Payer: Self-pay

## 2023-08-29 ENCOUNTER — Ambulatory Visit (INDEPENDENT_AMBULATORY_CARE_PROVIDER_SITE_OTHER): Payer: MEDICAID | Admitting: Podiatry

## 2023-08-29 ENCOUNTER — Encounter: Payer: Self-pay | Admitting: Podiatry

## 2023-08-29 DIAGNOSIS — D492 Neoplasm of unspecified behavior of bone, soft tissue, and skin: Secondary | ICD-10-CM

## 2023-08-29 DIAGNOSIS — D367 Benign neoplasm of other specified sites: Secondary | ICD-10-CM

## 2023-08-30 NOTE — Progress Notes (Signed)
 Subjective:   Patient ID: Beverly Torres, female   DOB: 28 y.o.   MRN: [id number]   HPI Patient presents with painful lesion plantar aspect left foot that has been present around a year ago and did well over that time.  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

## 2023-11-02 ENCOUNTER — Encounter: Payer: Self-pay | Admitting: Podiatry

## 2023-11-02 ENCOUNTER — Ambulatory Visit (INDEPENDENT_AMBULATORY_CARE_PROVIDER_SITE_OTHER): Payer: MEDICAID | Admitting: Podiatry

## 2023-11-02 DIAGNOSIS — D367 Benign neoplasm of other specified sites: Secondary | ICD-10-CM

## 2023-11-02 DIAGNOSIS — D492 Neoplasm of unspecified behavior of bone, soft tissue, and skin: Secondary | ICD-10-CM

## 2023-11-02 NOTE — Progress Notes (Signed)
 Subjective:   Patient ID: Beverly Torres, female   DOB: 28 y.o.   MRN: [id number]   HPI Patient presents with painful lesion underneath the left foot stating that it was improved somewhat but it is bothering her again now   ROS      Objective:  Physical Exam  Neurovascular status intact with lesion on the outside plantar left foot that upon debridement shows pinpoint bleeding pain to pressure of the lateral nature.     Assessment:  Reoccurrence of verruca plantaris left plantar     Plan:  H&P reviewed today I did deep debridement of the lesion and I went ahead and applied chemical agent to create immune response with sterile dressing and explained what to do if blistering were to occur.  Reappoint to recheck
